# Patient Record
Sex: Female | Born: 1995
Health system: Southern US, Community
[De-identification: ages and names within clinical notes are randomized; demographics above are authoritative.]

## PROBLEM LIST (undated history)

## (undated) ENCOUNTER — Emergency Department (HOSPITAL_COMMUNITY): Admission: EM | Payer: Managed Care, Other (non HMO) | Source: Home / Self Care

## (undated) DIAGNOSIS — T8859XA Other complications of anesthesia, initial encounter: Secondary | ICD-10-CM

## (undated) DIAGNOSIS — I10 Essential (primary) hypertension: Secondary | ICD-10-CM

## (undated) HISTORY — DX: Other complications of anesthesia, initial encounter: T88.59XA

## (undated) HISTORY — PX: NO PAST SURGERIES: SHX2092

---
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
## 2017-04-04 ENCOUNTER — Encounter (HOSPITAL_COMMUNITY): Payer: Self-pay | Admitting: Emergency Medicine

## 2017-04-04 ENCOUNTER — Emergency Department (HOSPITAL_COMMUNITY)
Admission: EM | Admit: 2017-04-04 | Discharge: 2017-04-05 | Disposition: A | Discharge status: Home / Self Care | Payer: 59 | Attending: Emergency Medicine | Admitting: Emergency Medicine

## 2017-04-04 DIAGNOSIS — G4489 Other headache syndrome: Secondary | ICD-10-CM | POA: Diagnosis not present

## 2017-04-04 DIAGNOSIS — J029 Acute pharyngitis, unspecified: Secondary | ICD-10-CM

## 2017-04-04 DIAGNOSIS — R51 Headache: Secondary | ICD-10-CM | POA: Diagnosis present

## 2017-04-04 MED ORDER — ACETAMINOPHEN 325 MG PO TABS
650.0000 mg | ORAL_TABLET | Freq: Once | ORAL | Status: AC
  Administered 2017-04-05: 650 mg via ORAL
  Filled 2017-04-04: qty 2

## 2017-04-04 NOTE — ED Triage Notes (Signed)
Pt states that she has been having a headache that hurts on the left side of her face today.  Denies any nausea or vomiting, and photosensitivity

## 2017-04-04 NOTE — ED Notes (Signed)
Pt updated on delay, expressed understanding,  

## 2017-04-04 NOTE — ED Provider Notes (Signed)
[facility]-EMER[facility]Y DEPT Provider Note   CSN: [id number] Arrival date & time: 04/04/17  2108  By signing my name below, I, Michelle Thompson, attest that this documentation has been prepared under the direction and in the presence of [name], MD. Electronically Signed: Cynda Thompson, Scribe. 04/04/17. 11:39 PM.  Time seen 23:29 PM  History   Chief Complaint Chief Complaint  Patient presents with  . Headache    HPI Comments: Michelle Thompson is a 21 y.o. female with no pertinent medical history, who presents to the Emergency Department complaining of a gradual-onset, constant headache that began earlier today around 9 pm and improving about one hour ago. Patient states she feels as if her headache is a migraine, although she has never had a migraine in the past. Patient reports associated left-sided facial pain, left-sided anterior neck pain, sore throat, and photophobia. No modifying factors indicated. Patient describes her pain as aching with a severity of 9/10, now 5/10. Patient states she is allergic to motrin, but can take aleve. Patient was diagnosed with HTN after the birth of her child, she has been off of her blood pressure medication for about 6 months (her child is 75 yo now). Patient has a family history of migraines (sister). Patient denies any numbness or tingling in the extremities, dental pain, fever, nausea, vomiting, or any other symptoms. He states her headaches normally only last 15 minutes and this one has lasted longer than usual. She took no meds at home prior to coming to the ED  The history is provided by the patient. No language interpreter was used.   PCP [facility]  History reviewed. No pertinent past medical history.  There are no active problems to display for this patient.   History reviewed. No pertinent surgical history.  OB History    No data available       Home Medications    Prior to Admission medications   Medication Sig  Start Date End Date Taking? Authorizing Provider  UNKNOWN TO PATIENT Take 1 tablet by mouth daily.   Yes Historical Provider, MD    Family History History reviewed. No pertinent family history.  Social History Social History  Substance Use Topics  . Smoking status: Never Smoker  . Smokeless tobacco: Never Used  . Alcohol use No  employed   Allergies   Motrin [ibuprofen]   Review of Systems Review of Systems  Constitutional: Negative for fever.  HENT: Positive for sore throat. Negative for dental problem.        Left-sided facial pain.   Eyes: Positive for photophobia.  Gastrointestinal: Negative for nausea and vomiting.  Musculoskeletal: Positive for neck pain (left-sided).  Neurological: Positive for headaches. Negative for numbness.  All other systems reviewed and are negative.    Physical Exam Updated Vital Signs BP 134/87 (BP Location: Left Arm)   Pulse 99   Temp 98.5 F (36.9 C) (Oral)   Resp 18   Ht  (1.575 m)   Wt 122 lb (55.3 kg)   LMP 03/30/2017   SpO2 100%   BMI 22.31 kg/m   Vital signs normal    Physical Exam  Constitutional: She is oriented to person, place, and time. She appears well-developed and well-nourished.  Non-toxic appearance. She does not appear ill. No distress.  HENT:  Head: Normocephalic and atraumatic.  Right Ear: External ear normal.  Left Ear: External ear normal.  Nose: Nose normal. No mucosal edema or rhinorrhea.  Mouth/Throat: Oropharynx is  clear and moist and mucous membranes are normal. No dental abscesses or uvula swelling.  Non-tender to palpation of the sinuses.   Eyes: Conjunctivae and EOM are normal. Pupils are equal, round, and reactive to light.  Neck: Normal range of motion and full passive range of motion without pain. Neck supple.  Cardiovascular: Normal rate, regular rhythm and normal heart sounds.  Exam reveals no gallop and no friction rub.   No murmur heard. Pulmonary/Chest: Effort normal and breath  sounds normal. No respiratory distress. She has no wheezes. She has no rhonchi. She has no rales. She exhibits no tenderness and no crepitus.  Abdominal: Soft. Normal appearance and bowel sounds are normal. She exhibits no distension. There is no tenderness. There is no rebound and no guarding.  Musculoskeletal: Normal range of motion. She exhibits no edema or tenderness.  Moves all extremities well.   Lymphadenopathy:    She has no cervical adenopathy.  Neurological: She is alert and oriented to person, place, and time. She has normal strength. No cranial nerve deficit.  Skin: Skin is warm, dry and intact. No rash noted. No erythema. No pallor.  Psychiatric: She has a normal mood and affect. Her speech is normal and behavior is normal. Her mood appears not anxious.  Nursing note and vitals reviewed.    ED Treatments / Results  Labs (all labs ordered are listed, but only abnormal results are displayed) Results for orders placed or performed during the hospital encounter of 04/04/17  Rapid strep screen  Result Value Ref Range   Streptococcus, Group A Screen (Direct) NEGATIVE NEGATIVE     Procedures Procedures (including critical care time)  Medications Ordered in ED Medications  acetaminophen (TYLENOL) tablet 650 mg (650 mg Oral Given 04/05/17 0011)     Initial Impression / Assessment and Plan / ED Course  I have reviewed the triage vital signs and the nursing notes.  Pertinent labs & imaging results that were available during my care of the patient were reviewed by me and considered in my medical decision making (see chart for details).      DIAGNOSTIC STUDIES: Oxygen Saturation is 100% on RA, normal by my interpretation.    COORDINATION OF CARE: 11:39 PM Discussed treatment plan with pt at bedside and pt agreed to plan, which includes acetaminophen. Pt drove herself to the ED. I did not want to try toradol with her ibuprofen allergy despite being able to take naproxen.   Strept screen was done.  Pt reports her headache is much better, but not gone. Given her test results.  She can take aleve at home for her headache.    Final Clinical Impressions(s) / ED Diagnoses   Final diagnoses:  Other headache syndrome  Sore throat    New Prescriptions OTC acetaminophen/aleve   Plan discharge  I personally performed the services described in this documentation, which was scribed in my presence. The recorded information has been reviewed and considered.  [name], MD, Concha Pyo, MD 04/05/17 [phone]

## 2017-04-05 LAB — RAPID STREP SCREEN (MED CTR MEBANE ONLY): Streptococcus, Group A Screen (Direct): NEGATIVE

## 2017-04-05 NOTE — Discharge Instructions (Signed)
Drink plenty of fluids. Take acetaminophen 650 mg every 6 hrs + 2 Aleve 2 tabs twice a day for headache or sore throat as needed. Gargle with salt water for discomfort. Use sore throat lozenges as needed. Recheck if you get a fever or seem worse.

## 2017-04-07 LAB — CULTURE, GROUP A STREP (THRC)

## 2017-05-03 ENCOUNTER — Emergency Department (HOSPITAL_COMMUNITY): Payer: 59

## 2017-05-03 ENCOUNTER — Encounter (HOSPITAL_COMMUNITY): Payer: Self-pay | Admitting: Emergency Medicine

## 2017-05-03 DIAGNOSIS — R0789 Other chest pain: Secondary | ICD-10-CM | POA: Insufficient documentation

## 2017-05-03 DIAGNOSIS — I10 Essential (primary) hypertension: Secondary | ICD-10-CM | POA: Insufficient documentation

## 2017-05-03 NOTE — ED Triage Notes (Signed)
Pt c/o central chest tightness while at work with sob.

## 2017-05-04 ENCOUNTER — Emergency Department (HOSPITAL_COMMUNITY)
Admission: EM | Admit: 2017-05-04 | Discharge: 2017-05-04 | Disposition: A | Discharge status: Home / Self Care | Payer: 59 | Attending: Emergency Medicine | Admitting: Emergency Medicine

## 2017-05-04 DIAGNOSIS — R0789 Other chest pain: Secondary | ICD-10-CM

## 2017-05-04 HISTORY — DX: Essential (primary) hypertension: I10

## 2017-05-04 NOTE — ED Provider Notes (Signed)
[facility] Provider Note   CSN: [id number] Arrival date & time: 05/03/17  2243     History   Chief Complaint Chief Complaint  Patient presents with  . Chest Pain    HPI Michelle Thompson is a 21 y.o. female.  Patient presents with complaints of right-sided chest pain that began while at work. She was doing light lifting and work when the pain began. She reports that there was also coughing episode that occurred right before the pain started. She has had these symptoms before. She [place]. No reason for the pain has been discovered. Patient reports that the pain is improved at time of arrival to the ER.      Past Medical History:  Diagnosis Date  . Hypertension     There are no active problems to display for this patient.   History reviewed. No pertinent surgical history.  OB History    No data available       Home Medications    Prior to Admission medications   Medication Sig Start Date End Date Taking? Authorizing Provider  UNKNOWN TO PATIENT Take 1 tablet by mouth daily.    [provider]    Family History History reviewed. No pertinent family history.  Social History Social History  Substance Use Topics  . Smoking status: Never Smoker  . Smokeless tobacco: Never Used  . Alcohol use No     Allergies   Motrin [ibuprofen]   Review of Systems Review of Systems  Respiratory: Positive for cough.   Cardiovascular: Positive for chest pain.  All other systems reviewed and are negative.    Physical Exam Updated Vital Signs Pulse 83   Temp 98 F (36.7 C) (Oral)   Resp 20   Ht 5\' 2"  (1.575 m)   Wt 117 lb (53.1 kg)   LMP 04/23/2017   SpO2 100%   BMI 21.40 kg/m   Physical Exam  Constitutional: She is oriented to person, place, and time. She appears well-developed and well-nourished. No distress.  HENT:  Head: Normocephalic and atraumatic.  Right Ear: Hearing normal.  Left Ear: Hearing  normal.  Nose: Nose normal.  Mouth/Throat: Oropharynx is clear and moist and mucous membranes are normal.  Eyes: Conjunctivae and EOM are normal. Pupils are equal, round, and reactive to light.  Neck: Normal range of motion. Neck supple.  Cardiovascular: Regular rhythm, S1 normal and S2 normal.  Exam reveals no gallop and no friction rub.   No murmur heard. Pulmonary/Chest: Effort normal and breath sounds normal. No respiratory distress. She exhibits tenderness.    Abdominal: Soft. Normal appearance and bowel sounds are normal. There is no hepatosplenomegaly. There is no tenderness. There is no rebound, no guarding, no tenderness at McBurney's point and negative Murphy's sign. No hernia.  Musculoskeletal: Normal range of motion.  Neurological: She is alert and oriented to person, place, and time. She has normal strength. No cranial nerve deficit or sensory deficit. Coordination normal. GCS eye subscore is 4. GCS verbal subscore is 5. GCS motor subscore is 6.  Skin: Skin is warm, dry and intact. No rash noted. No cyanosis.  Psychiatric: She has a normal mood and affect. Her speech is normal and behavior is normal. Thought content normal.  Nursing note and vitals reviewed.    ED Treatments / Results  Labs (all labs ordered are listed, but only abnormal results are displayed) Labs Reviewed - No data to display  EKG  EKG Interpretation  Date/Time:  Friday May 03 2017 22:57:24 EDT Ventricular Rate:  71 PR Interval:  140 QRS Duration: 84 QT Interval:  376 QTC Calculation: 408 R Axis:   88 Text Interpretation:  Normal sinus rhythm with sinus arrhythmia T wave abnormality, consider anterior ischemia Abnormal ECG No significant change since last tracing Confirmed by Addis Tuohy  MD, Ilee Randleman [phone]) on 05/04/2017 1:05:14 AM       Radiology Dg Chest 2 View  Result Date: 05/03/2017 CLINICAL DATA:  Central chest tightness EXAM: CHEST  2 VIEW COMPARISON:  None. FINDINGS: The heart size and  mediastinal contours are within normal limits. Both lungs are clear. The visualized skeletal structures are unremarkable. IMPRESSION: No active cardiopulmonary disease. Electronically Signed   By: Jasmine Pang M.D.   On: 05/03/2017 23:10    Procedures Procedures (including critical care time)  Medications Ordered in ED Medications - No data to display   Initial Impression / Assessment and Plan / ED Course  I have reviewed the triage vital signs and the nursing notes.  Pertinent labs & imaging results that were available during my care of the patient were reviewed by me and considered in my medical decision making (see chart for details).     Patient presents with complaints of chest pain. Pain is on the right side of her chest wall area and is reproducible with palpation here in the ER. She has had workups previously. EKG reveals inverted T waves anteriorly, but she has several other EKGs from previous visits with similar pattern. No change today. Chest x-ray is clear. Patient exhibiting signs of musculoskeletal chest wall pain, reassured, will treat.  Final Clinical Impressions(s) / ED Diagnoses   Final diagnoses:  Chest wall pain    New Prescriptions New Prescriptions   No medications on file     Gilda Crease, MD 05/04/17 [phone]

## 2017-06-14 ENCOUNTER — Encounter (HOSPITAL_COMMUNITY): Payer: Self-pay

## 2017-06-14 ENCOUNTER — Emergency Department (HOSPITAL_COMMUNITY)
Admission: EM | Admit: 2017-06-14 | Discharge: 2017-06-14 | Disposition: A | Discharge status: Home / Self Care | Payer: 59 | Attending: Emergency Medicine | Admitting: Emergency Medicine

## 2017-06-14 DIAGNOSIS — I10 Essential (primary) hypertension: Secondary | ICD-10-CM | POA: Diagnosis not present

## 2017-06-14 DIAGNOSIS — Y999 Unspecified external cause status: Secondary | ICD-10-CM | POA: Diagnosis not present

## 2017-06-14 DIAGNOSIS — W57XXXA Bitten or stung by nonvenomous insect and other nonvenomous arthropods, initial encounter: Secondary | ICD-10-CM | POA: Insufficient documentation

## 2017-06-14 DIAGNOSIS — Y929 Unspecified place or not applicable: Secondary | ICD-10-CM | POA: Insufficient documentation

## 2017-06-14 DIAGNOSIS — S70361A Insect bite (nonvenomous), right thigh, initial encounter: Secondary | ICD-10-CM | POA: Diagnosis not present

## 2017-06-14 DIAGNOSIS — Y9389 Activity, other specified: Secondary | ICD-10-CM | POA: Insufficient documentation

## 2017-06-14 DIAGNOSIS — R110 Nausea: Secondary | ICD-10-CM | POA: Diagnosis not present

## 2017-06-14 MED ORDER — IBUPROFEN 400 MG PO TABS
400.0000 mg | ORAL_TABLET | Freq: Once | ORAL | Status: AC
  Administered 2017-06-14: 400 mg via ORAL
  Filled 2017-06-14: qty 1

## 2017-06-14 MED ORDER — DOXYCYCLINE HYCLATE 100 MG PO TABS
100.0000 mg | ORAL_TABLET | Freq: Once | ORAL | Status: AC
  Administered 2017-06-14: 100 mg via ORAL
  Filled 2017-06-14: qty 1

## 2017-06-14 MED ORDER — DOXYCYCLINE HYCLATE 100 MG PO CAPS: 100.0000 mg | ORAL_CAPSULE | Freq: Two times a day (BID) | ORAL | 0 refills | Status: DC

## 2017-06-14 NOTE — Discharge Instructions (Signed)
Take the antibiotics as prescribed. Use ice and ibuprofen as needed for pain and swelling. Followup with a primary doctor. Return to the ED if the swelling and redness worsen, you develop a fever, vomiting, or any other concerns.

## 2017-06-14 NOTE — ED Provider Notes (Signed)
[facility]-EM[facility] DEPT Provider Note   CSN: [id number] Arrival date & time: 06/14/17  0447     History   Chief Complaint Chief Complaint  Patient presents with  . Insect Bite    HPI Michelle Thompson is a 21 y.o. female.  Patient reports tick bite to right upper thigh that she noticed yesterday. States she pulled off the tick 24 hours ago. At that time she had a small area of redness which is gotten bigger overnight. She describes pain, redness and swelling to her right anterior thigh that is progressively worsening. she took ibuprofen about 6 hours ago. Denies fever. Has had nausea but no vomiting. No difficulty breathing or swallowing. No previous similar reactions to tick bites. Denies any possibility of pregnancy.   The history is provided by the patient.    Past Medical History:  Diagnosis Date  . Hypertension     There are no active problems to display for this patient.   History reviewed. No pertinent surgical history.  OB History    No data available       Home Medications    Prior to Admission medications   Medication Sig Start Date End Date Taking? Authorizing Provider  UNKNOWN TO PATIENT Take 1 tablet by mouth daily.   Yes [provider]    Family History No family history on file.  Social History Social History  Substance Use Topics  . Smoking status: Never Smoker  . Smokeless tobacco: Never Used  . Alcohol use No     Allergies   Motrin [ibuprofen]   Review of Systems Review of Systems  Constitutional: Negative for activity change, appetite change and fever.  HENT: Negative for congestion and trouble swallowing.   Respiratory: Negative for cough, chest tightness and shortness of breath.   Cardiovascular: Negative for chest pain and leg swelling.  Gastrointestinal: Negative for abdominal pain, nausea and vomiting.  Genitourinary: Negative for dysuria, vaginal bleeding and vaginal discharge.  Musculoskeletal: Negative for  arthralgias and myalgias.  Skin: Positive for rash. Negative for wound.  Neurological: Negative for dizziness, weakness and headaches.   all other systems are negative except as noted in the HPI and PMH.     Physical Exam Updated Vital Signs BP 137/86 (BP Location: Left Arm)   Pulse 90   Temp 98 F (36.7 C) (Oral)   Resp 16   Ht 5\' 2"  (1.575 m)   Wt 53.1 kg (117 lb)   LMP 05/11/2017   SpO2 100%   BMI 21.40 kg/m   Physical Exam  Constitutional: She is oriented to person, place, and time. She appears well-developed and well-nourished. No distress.  HENT:  Head: Normocephalic and atraumatic.  Mouth/Throat: Oropharynx is clear and moist. No oropharyngeal exudate.  Eyes: Conjunctivae and EOM are normal. Pupils are equal, round, and reactive to light.  Neck: Normal range of motion. Neck supple.  No meningismus.  Cardiovascular: Normal rate, regular rhythm, normal heart sounds and intact distal pulses.   No murmur heard. Pulmonary/Chest: Effort normal and breath sounds normal. No respiratory distress.  Abdominal: Soft. There is no tenderness. There is no rebound and no guarding.  Musculoskeletal: Normal range of motion. She exhibits tenderness. She exhibits no edema.  Neurological: She is alert and oriented to person, place, and time. No cranial nerve deficit. She exhibits normal muscle tone. Coordination normal.   5/5 strength throughout. CN 2-12 intact.Equal grip strength.   Skin: Skin is warm. Rash noted. There is erythema.  Chaperone present. 4 x8  cm area of erythema to R anterior thigh.  Central induration present. No fluctuance.  Psychiatric: She has a normal mood and affect. Her behavior is normal.  Nursing note and vitals reviewed.    ED Treatments / Results  Labs (all labs ordered are listed, but only abnormal results are displayed) Labs Reviewed - No data to display  EKG  EKG Interpretation None       Radiology No results found.  Procedures Procedures  (including critical care time)  Medications Ordered in ED Medications  ibuprofen (ADVIL,MOTRIN) tablet 400 mg (not administered)  doxycycline (VIBRA-TABS) tablet 100 mg (not administered)     Initial Impression / Assessment and Plan / ED Course  I have reviewed the triage vital signs and the nursing notes.  Pertinent labs & imaging results that were available during my care of the patient were reviewed by me and considered in my medical decision making (see chart for details).    Tick bite to R anterior thigh.  Erythema without fluctuance. Well appearing, nontoxic.   Treat with doxycycline which should cover any component of early cellulitis, NSAIDs, ice. PCP follow up and recheck in 2 days. Return precautions discussed.   Final Clinical Impressions(s) / ED Diagnoses   Final diagnoses:  Tick bite, initial encounter    New Prescriptions New Prescriptions   No medications on file     Glynn Octave, MD 06/14/17 [id number]

## 2017-06-14 NOTE — ED Triage Notes (Signed)
Pt states she pulled a tick off of her right upper leg yesterday and now has a large red swelling around the bite area that is painful

## 2017-06-29 ENCOUNTER — Emergency Department (HOSPITAL_COMMUNITY): Payer: 59

## 2017-06-29 ENCOUNTER — Emergency Department (HOSPITAL_COMMUNITY)
Admission: EM | Admit: 2017-06-29 | Discharge: 2017-06-29 | Disposition: A | Discharge status: Home / Self Care | Payer: 59 | Attending: Emergency Medicine | Admitting: Emergency Medicine

## 2017-06-29 ENCOUNTER — Encounter (HOSPITAL_COMMUNITY): Payer: Self-pay | Admitting: Emergency Medicine

## 2017-06-29 DIAGNOSIS — E860 Dehydration: Secondary | ICD-10-CM

## 2017-06-29 DIAGNOSIS — K529 Noninfective gastroenteritis and colitis, unspecified: Secondary | ICD-10-CM | POA: Diagnosis not present

## 2017-06-29 DIAGNOSIS — Z79899 Other long term (current) drug therapy: Secondary | ICD-10-CM | POA: Diagnosis not present

## 2017-06-29 DIAGNOSIS — R112 Nausea with vomiting, unspecified: Secondary | ICD-10-CM | POA: Diagnosis present

## 2017-06-29 DIAGNOSIS — I10 Essential (primary) hypertension: Secondary | ICD-10-CM | POA: Diagnosis not present

## 2017-06-29 LAB — CBC WITH DIFFERENTIAL/PLATELET
Basophils Absolute: 0 10*3/uL (ref 0.0–0.1)
Basophils Relative: 0 %
EOS ABS: 0 10*3/uL (ref 0.0–0.7)
EOS PCT: 0 %
HCT: 46.1 % — ABNORMAL HIGH (ref 36.0–46.0)
Hemoglobin: 15.2 g/dL — ABNORMAL HIGH (ref 12.0–15.0)
Lymphocytes Relative: 3 %
Lymphs Abs: 0.5 10*3/uL — ABNORMAL LOW (ref 0.7–4.0)
MCH: 23 pg — AB (ref 26.0–34.0)
MCHC: 33 g/dL (ref 30.0–36.0)
MCV: 69.6 fL — ABNORMAL LOW (ref 78.0–100.0)
Monocytes Absolute: 0.5 10*3/uL (ref 0.1–1.0)
Monocytes Relative: 3 %
Neutro Abs: 14.1 10*3/uL — ABNORMAL HIGH (ref 1.7–7.7)
Neutrophils Relative %: 94 %
PLATELETS: 223 10*3/uL (ref 150–400)
RBC: 6.62 MIL/uL — AB (ref 3.87–5.11)
RDW: 13.5 % (ref 11.5–15.5)
WBC: 15.1 10*3/uL — AB (ref 4.0–10.5)

## 2017-06-29 LAB — URINALYSIS, ROUTINE W REFLEX MICROSCOPIC
BILIRUBIN URINE: NEGATIVE
Glucose, UA: NEGATIVE mg/dL
Ketones, ur: 5 mg/dL — AB
Leukocytes, UA: NEGATIVE
NITRITE: NEGATIVE
Protein, ur: 100 mg/dL — AB
Specific Gravity, Urine: 1.027 (ref 1.005–1.030)
pH: 5 (ref 5.0–8.0)

## 2017-06-29 LAB — COMPREHENSIVE METABOLIC PANEL
ALT: 31 U/L (ref 14–54)
ANION GAP: 13 (ref 5–15)
AST: 35 U/L (ref 15–41)
Albumin: 4.7 g/dL (ref 3.5–5.0)
Alkaline Phosphatase: 96 U/L (ref 38–126)
BUN: 12 mg/dL (ref 6–20)
CHLORIDE: 106 mmol/L (ref 101–111)
CO2: 21 mmol/L — AB (ref 22–32)
Calcium: 10.3 mg/dL (ref 8.9–10.3)
Creatinine, Ser: 0.83 mg/dL (ref 0.44–1.00)
GFR calc non Af Amer: >60 mL/min (ref >60)
Glucose, Bld: 117 mg/dL — ABNORMAL HIGH (ref 65–99)
POTASSIUM: 4.2 mmol/L (ref 3.5–5.1)
Sodium: 140 mmol/L (ref 135–145)
Total Bilirubin: 1 mg/dL (ref 0.3–1.2)
Total Protein: 9 g/dL — ABNORMAL HIGH (ref 6.5–8.1)

## 2017-06-29 LAB — I-STAT BETA HCG BLOOD, ED (MC, WL, AP ONLY): I-stat hCG, quantitative: 5.1 m[IU]/mL — ABNORMAL HIGH (ref <5)

## 2017-06-29 LAB — HCG, QUANTITATIVE, PREGNANCY

## 2017-06-29 MED ORDER — HYDROMORPHONE HCL 1 MG/ML IJ SOLN
0.5000 mg | Freq: Once | INTRAMUSCULAR | Status: AC
  Administered 2017-06-29: 0.5 mg via INTRAVENOUS
  Filled 2017-06-29: qty 1

## 2017-06-29 MED ORDER — ONDANSETRON HCL 4 MG/2ML IJ SOLN
4.0000 mg | Freq: Once | INTRAMUSCULAR | Status: AC
  Administered 2017-06-29: 4 mg via INTRAVENOUS
  Filled 2017-06-29: qty 2

## 2017-06-29 MED ORDER — SODIUM CHLORIDE 0.9 % IV BOLUS (SEPSIS)
1000.0000 mL | Freq: Once | INTRAVENOUS | Status: AC
  Administered 2017-06-29: 1000 mL via INTRAVENOUS

## 2017-06-29 MED ORDER — ONDANSETRON 4 MG PO TBDP: ORAL_TABLET | ORAL | 0 refills | Status: DC

## 2017-06-29 MED ORDER — TRAMADOL HCL 50 MG PO TABS: 50.0000 mg | ORAL_TABLET | Freq: Four times a day (QID) | ORAL | 0 refills | Status: DC | PRN

## 2017-06-29 MED ORDER — IOPAMIDOL (ISOVUE-300) INJECTION 61%
INTRAVENOUS | Status: AC
  Administered 2017-06-29: 19:00:00
  Filled 2017-06-29: qty 30

## 2017-06-29 MED ORDER — SODIUM CHLORIDE 0.9 % IV BOLUS (SEPSIS)
2000.0000 mL | Freq: Once | INTRAVENOUS | Status: AC
  Administered 2017-06-29: 2000 mL via INTRAVENOUS

## 2017-06-29 MED ORDER — IOPAMIDOL (ISOVUE-300) INJECTION 61%
100.0000 mL | Freq: Once | INTRAVENOUS | Status: AC | PRN
  Administered 2017-06-29: 100 mL via INTRAVENOUS

## 2017-06-29 MED ORDER — PANTOPRAZOLE SODIUM 40 MG IV SOLR
40.0000 mg | Freq: Once | INTRAVENOUS | Status: AC
  Administered 2017-06-29: 40 mg via INTRAVENOUS
  Filled 2017-06-29: qty 40

## 2017-06-29 NOTE — ED Notes (Signed)
Dr Deretha EmoryZackowski notified of pt's heartrate, advised pt was ok for discharge, no additional orders given,

## 2017-06-29 NOTE — ED Triage Notes (Signed)
Pt reports n/v/d since 0430 this morning with lower back pain.  Has not been able to keep anything down.

## 2017-06-29 NOTE — ED Provider Notes (Signed)
[facility] Provider Note   CSN: [id number] Arrival date & time: 06/29/17  1547     History   Chief Complaint Chief Complaint  Patient presents with  . Emesis  . Diarrhea    HPI Michelle Thompson is a 21 y.o. female.  Patient complains of vomiting and diarrhea with some back pain.   The history is provided by the patient. No language interpreter was used.  Emesis   This is a recurrent problem. The current episode started yesterday. The problem occurs more than 10 times per day. The problem has not changed since onset.The emesis has an appearance of stomach contents. There has been no fever. Associated symptoms include diarrhea. Pertinent negatives include no abdominal pain, no cough and no headaches.  Diarrhea   Associated symptoms include vomiting. Pertinent negatives include no abdominal pain, no headaches and no cough.    Past Medical History:  Diagnosis Date  . Hypertension     There are no active problems to display for this patient.   History reviewed. No pertinent surgical history.  OB History    No data available       Home Medications    Prior to Admission medications   Medication Sig Start Date End Date Taking? Authorizing Provider  etonogestrel (NEXPLANON) 68 MG IMPL implant 1 each by Subdermal route once.   Yes [provider]    Family History History reviewed. No pertinent family history.  Social History Social History  Substance Use Topics  . Smoking status: Never Smoker  . Smokeless tobacco: Never Used  . Alcohol use No     Allergies   Motrin [ibuprofen]   Review of Systems Review of Systems  Constitutional: Negative for appetite change and fatigue.  HENT: Negative for congestion, ear discharge and sinus pressure.   Eyes: Negative for discharge.  Respiratory: Negative for cough.   Cardiovascular: Negative for chest pain.  Gastrointestinal: Positive for diarrhea and vomiting. Negative for abdominal pain.    Genitourinary: Negative for frequency and hematuria.  Musculoskeletal: Negative for back pain.  Skin: Negative for rash.  Neurological: Negative for seizures and headaches.  Psychiatric/Behavioral: Negative for hallucinations.     Physical Exam Updated Vital Signs BP 120/67   Pulse (!) 120   Temp 98.5 F (36.9 C) (Oral)   Resp 15   Ht 5\' 2"  (1.575 m)   Wt 51.7 kg (114 lb)   LMP 06/19/2017   SpO2 99%   BMI 20.85 kg/m   Physical Exam  Constitutional: She is oriented to person, place, and time. She appears well-developed.  HENT:  Head: Normocephalic.  Eyes: Conjunctivae and EOM are normal. No scleral icterus.  Neck: Neck supple. No thyromegaly present.  Cardiovascular: Normal rate and regular rhythm.  Exam reveals no gallop and no friction rub.   No murmur heard. Pulmonary/Chest: No stridor. She has no wheezes. She has no rales. She exhibits no tenderness.  Abdominal: She exhibits no distension. There is tenderness. There is no rebound.  Minimal left lower quadrant light low quadrant tenderness  Musculoskeletal: Normal range of motion. She exhibits no edema.  Lymphadenopathy:    She has no cervical adenopathy.  Neurological: She is oriented to person, place, and time. She exhibits normal muscle tone. Coordination normal.  Skin: No rash noted. No erythema.  Psychiatric: She has a normal mood and affect. Her behavior is normal.     ED Treatments / Results  Labs (all labs ordered are listed, but only abnormal results are displayed) Labs  Reviewed  CBC WITH DIFFERENTIAL/PLATELET - Abnormal; Notable for the following:       Result Value   WBC 15.1 (*)    RBC 6.62 (*)    Hemoglobin 15.2 (*)    HCT 46.1 (*)    MCV 69.6 (*)    MCH 23.0 (*)    Neutro Abs 14.1 (*)    Lymphs Abs 0.5 (*)    All other components within normal limits  COMPREHENSIVE METABOLIC PANEL - Abnormal; Notable for the following:    CO2 21 (*)    Glucose, Bld 117 (*)    Total Protein 9.0 (*)    All  other components within normal limits  URINALYSIS, ROUTINE W REFLEX MICROSCOPIC - Abnormal; Notable for the following:    APPearance HAZY (*)    Hgb urine dipstick SMALL (*)    Ketones, ur 5 (*)    Protein, ur 100 (*)    Bacteria, UA RARE (*)    Squamous Epithelial / LPF 6-30 (*)    All other components within normal limits  I-STAT BETA HCG BLOOD, ED ([facility], [facility], [facility] ONLY) - Abnormal; Notable for the following:    I-stat hCG, quantitative 5.1 (*)    All other components within normal limits  HCG, QUANTITATIVE, PREGNANCY    EKG  EKG Interpretation None       Radiology No results found.  Procedures Procedures (including critical care time)  Medications Ordered in ED Medications  iopamidol (ISOVUE-300) 61 % injection (not administered)  iopamidol (ISOVUE-300) 61 % injection 100 mL (not administered)  ondansetron (ZOFRAN) injection 4 mg (4 mg Intravenous Given 06/29/17 1615)  pantoprazole (PROTONIX) injection 40 mg (40 mg Intravenous Given 06/29/17 1615)  sodium chloride 0.9 % bolus 2,000 mL (0 mLs Intravenous Stopped 06/29/17 1840)  sodium chloride 0.9 % bolus 1,000 mL (0 mLs Intravenous Stopped 06/29/17 2108)  HYDROmorphone (DILAUDID) injection 0.5 mg (0.5 mg Intravenous Given 06/29/17 1839)     Initial Impression / Assessment and Plan / ED Course  I have reviewed the triage vital signs and the nursing notes.  Pertinent labs & imaging results that were available during my care of the patient were reviewed by me and considered in my medical decision making (see chart for details).   patient with gastroenteritis. She will be sent home with Zofran and Ultram   Final Clinical Impressions(s) / ED Diagnoses   Final diagnoses:  Dehydration    New Prescriptions New Prescriptions   No medications on file     Bethann BerkshireZammit, Sae Handrich, MD 06/29/17 2201

## 2017-06-29 NOTE — ED Notes (Signed)
Pt drinking contrast without difficulty.  Denies any pain at this time. Denies any nausea.

## 2017-06-29 NOTE — Discharge Instructions (Signed)
Drink plenty of fluids.  Follow-up with your family doctor next week for recheck 

## 2017-07-01 LAB — URINE CULTURE: Culture: NO GROWTH

## 2017-08-12 ENCOUNTER — Encounter (HOSPITAL_COMMUNITY): Payer: Self-pay

## 2017-08-12 DIAGNOSIS — Z0471 Encounter for examination and observation following alleged adult physical abuse: Secondary | ICD-10-CM | POA: Insufficient documentation

## 2017-08-12 DIAGNOSIS — Z5321 Procedure and treatment not carried out due to patient leaving prior to being seen by health care provider: Secondary | ICD-10-CM | POA: Insufficient documentation

## 2017-08-12 DIAGNOSIS — R221 Localized swelling, mass and lump, neck: Secondary | ICD-10-CM | POA: Diagnosis present
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
## 2017-08-13 ENCOUNTER — Emergency Department (HOSPITAL_COMMUNITY)
Admission: EM | Admit: 2017-08-13 | Discharge: 2017-08-13 | Disposition: A | Discharge status: Home / Self Care | Payer: 59 | Attending: Emergency Medicine | Admitting: Emergency Medicine

## 2017-08-13 NOTE — ED Notes (Signed)
Registration states pt left 

## 2017-12-06 ENCOUNTER — Emergency Department
Admission: EM | Admit: 2017-12-06 | Discharge: 2017-12-06 | Disposition: A | Discharge status: Home / Self Care | Payer: 59 | Attending: Emergency Medicine | Admitting: Emergency Medicine

## 2017-12-06 ENCOUNTER — Emergency Department: Payer: 59

## 2017-12-06 ENCOUNTER — Encounter: Payer: Self-pay | Admitting: Emergency Medicine

## 2017-12-06 DIAGNOSIS — Y990 Civilian activity done for income or pay: Secondary | ICD-10-CM | POA: Diagnosis not present

## 2017-12-06 DIAGNOSIS — Y929 Unspecified place or not applicable: Secondary | ICD-10-CM | POA: Insufficient documentation

## 2017-12-06 DIAGNOSIS — Y939 Activity, unspecified: Secondary | ICD-10-CM | POA: Insufficient documentation

## 2017-12-06 DIAGNOSIS — S6391XA Sprain of unspecified part of right wrist and hand, initial encounter: Secondary | ICD-10-CM | POA: Diagnosis not present

## 2017-12-06 DIAGNOSIS — S63501A Unspecified sprain of right wrist, initial encounter: Secondary | ICD-10-CM

## 2017-12-06 DIAGNOSIS — S6991XA Unspecified injury of right wrist, hand and finger(s), initial encounter: Secondary | ICD-10-CM | POA: Diagnosis present

## 2017-12-06 MED ORDER — TRAMADOL HCL 50 MG PO TABS: 50.0000 mg | ORAL_TABLET | Freq: Four times a day (QID) | ORAL | 0 refills | Status: DC | PRN
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
## 2017-12-06 NOTE — Discharge Instructions (Signed)
Follow up with the primary care provider of your choice for symptoms that are not improving over the week °Return to the ER for symptoms that change or worsen if unable to schedule an appointment. °

## 2017-12-06 NOTE — ED Triage Notes (Signed)
Patient presents to the ED with right wrist pain x 2 weeks.  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  Patient states, "I know it's not broken because I can move it a lot of ways, it's just certain ways I can't move it.  It's hard to drive and sometimes it's numb and tingly."  Patient is in no obvious distress at this time.

## 2017-12-06 NOTE — ED Notes (Signed)
See triage note  Developed right wrist pain about 2 weeks ago  Denies specific injury  Tenderness with movement and touch  Positive pulses

## 2018-01-15 ENCOUNTER — Emergency Department
Admission: EM | Admit: 2018-01-15 | Discharge: 2018-01-15 | Disposition: A | Discharge status: Home / Self Care | Payer: Self-pay | Attending: Emergency Medicine | Admitting: Emergency Medicine

## 2018-01-15 ENCOUNTER — Emergency Department: Payer: Self-pay

## 2018-01-15 ENCOUNTER — Encounter: Payer: Self-pay | Admitting: Emergency Medicine

## 2018-01-15 ENCOUNTER — Other Ambulatory Visit: Payer: Self-pay

## 2018-01-15 DIAGNOSIS — J039 Acute tonsillitis, unspecified: Secondary | ICD-10-CM | POA: Insufficient documentation

## 2018-01-15 DIAGNOSIS — Z79899 Other long term (current) drug therapy: Secondary | ICD-10-CM | POA: Insufficient documentation

## 2018-01-15 DIAGNOSIS — I10 Essential (primary) hypertension: Secondary | ICD-10-CM | POA: Insufficient documentation

## 2018-01-15 LAB — URINALYSIS, COMPLETE (UACMP) WITH MICROSCOPIC
Bilirubin Urine: NEGATIVE
Glucose, UA: NEGATIVE mg/dL
HGB URINE DIPSTICK: NEGATIVE
Ketones, ur: NEGATIVE mg/dL
Leukocytes, UA: NEGATIVE
NITRITE: NEGATIVE
Protein, ur: 30 mg/dL — AB
SPECIFIC GRAVITY, URINE: 1.024 (ref 1.005–1.030)
pH: 7 (ref 5.0–8.0)

## 2018-01-15 LAB — INFLUENZA PANEL BY PCR (TYPE A & B)
INFLAPCR: NEGATIVE
Influenza B By PCR: NEGATIVE

## 2018-01-15 LAB — GROUP A STREP BY PCR: GROUP A STREP BY PCR: NOT DETECTED

## 2018-01-15 MED ORDER — AMOXICILLIN-POT CLAVULANATE 875-125 MG PO TABS
1.0000 | ORAL_TABLET | Freq: Once | ORAL | Status: AC
  Administered 2018-01-15: 1 via ORAL
  Filled 2018-01-15: qty 1

## 2018-01-15 MED ORDER — LIDOCAINE VISCOUS 2 % MT SOLN: 10.0000 mL | OROMUCOSAL | 0 refills | Status: DC | PRN

## 2018-01-15 MED ORDER — AMOXICILLIN-POT CLAVULANATE 875-125 MG PO TABS: 1.0000 | ORAL_TABLET | Freq: Two times a day (BID) | ORAL | 0 refills | Status: AC

## 2018-01-15 MED ORDER — ACETAMINOPHEN 325 MG PO TABS
650.0000 mg | ORAL_TABLET | Freq: Once | ORAL | Status: AC | PRN
  Administered 2018-01-15: 650 mg via ORAL
  Filled 2018-01-15 (×2): qty 2

## 2018-01-15 MED ORDER — IPRATROPIUM-ALBUTEROL 0.5-2.5 (3) MG/3ML IN SOLN
3.0000 mL | Freq: Once | RESPIRATORY_TRACT | Status: AC
  Administered 2018-01-15: 3 mL via RESPIRATORY_TRACT
  Filled 2018-01-15: qty 3

## 2018-01-15 MED ORDER — SODIUM CHLORIDE 0.9 % IV BOLUS (SEPSIS)
1000.0000 mL | Freq: Once | INTRAVENOUS | Status: AC
  Administered 2018-01-15: 1000 mL via INTRAVENOUS

## 2018-01-15 NOTE — ED Notes (Signed)
Pt states she was given 2 tylenol tablets by triage nurse upon arrival.

## 2018-01-15 NOTE — ED Provider Notes (Signed)
[facility] Emergency Department Provider Note  ____________________________________________  Time seen: Approximately 2:16 PM  I have reviewed the triage vital signs and the nursing notes.   HISTORY  Chief Complaint Fever    HPI Michelle Thompson is a 22 y.o. female that presents to emergency department for evaluation of body aches, fever, chills, sore throat for 3 days.  Patient states that  she did not have a thermometer so her mother felt her forehead and states that she did not feel warm.  Today when she was at work there was a doctor who took her temperature and read it at 102.  She feels like she has to cough something up but is unable to get anything up.  She has been taking robitussin for 3 days without relief. She denies nasal congestion, shortness of breath, chest pain, nausea, vomiting, abdominal pain, dysuria, urgency, frequency.  Past Medical History:  Diagnosis Date  . Hypertension     There are no active problems to display for this patient.   History reviewed. No pertinent surgical history.  Prior to Admission medications   Medication Sig Start Date End Date Taking? Authorizing Provider  amoxicillin-clavulanate (AUGMENTIN) 875-125 MG tablet Take 1 tablet by mouth 2 (two) times daily for 10 days. 01/15/18 01/25/18  Enid Derry, PA-C  etonogestrel (NEXPLANON) 68 MG IMPL implant 1 each by Subdermal route once.    [provider]  lidocaine (XYLOCAINE) 2 % solution Use as directed 10 mLs in the mouth or throat as needed for mouth pain. 01/15/18   Enid Derry, PA-C  ondansetron (ZOFRAN ODT) 4 MG disintegrating tablet 4mg  ODT q4 hours prn nausea/vomit 06/29/17   Bethann Berkshire, MD  traMADol (ULTRAM) 50 MG tablet Take 1 tablet (50 mg total) by mouth every 6 (six) hours as needed. 12/06/17   Triplett, Kasandra Knudsen, FNP    Allergies Motrin [ibuprofen]  History reviewed. No pertinent family history.  Social History Social History   Tobacco  Use  . Smoking status: Never Smoker  . Smokeless tobacco: Never Used  Substance Use Topics  . Alcohol use: No  . Drug use: No     Review of Systems  ENT: Negative for congestion and rhinorrhea. Cardiovascular: No chest pain. Respiratory: No SOB. Gastrointestinal: No abdominal pain.  No nausea, no vomiting.  No diarrhea.  No constipation. Musculoskeletal: Negative for musculoskeletal pain. Skin: Negative for rash, abrasions, lacerations, ecchymosis.   ____________________________________________   PHYSICAL EXAM:  VITAL SIGNS: ED Triage Vitals  Enc Vitals Group     BP 01/15/18 1252 137/70     Pulse Rate 01/15/18 1252 (!) 120     Resp 01/15/18 1252 18     Temp 01/15/18 1252 (!) 101.9 F (38.8 C)     Temp Source 01/15/18 1252 Oral     SpO2 01/15/18 1252 100 %     Weight 01/15/18 1253 135 lb (61.2 kg)     Height 01/15/18 1253 5\' 2"  (1.575 m)     Head Circumference --      Peak Flow --      Pain Score 01/15/18 1250 10     Pain Loc --      Pain Edu? --      Excl. in GC? --      Constitutional: Alert and oriented. Well appearing and in no acute distress. Eyes: Conjunctivae are normal. PERRL. EOMI. No discharge. Head: Atraumatic. ENT: No frontal and maxillary sinus tenderness.      Ears: Tympanic membranes pearly  gray with good landmarks. No discharge.      Nose: No congestion/rhinnorhea.      Mouth/Throat: Mucous membranes are moist. Oropharynx erythematous. Tonsils mildly enlarged bilaterally, worse on the left. No visible abscess. No exudates. Uvula midline.  Neck: No stridor.   Hematological/Lymphatic/Immunilogical: No cervical lymphadenopathy. Cardiovascular: Normal rate, regular rhythm.  Good peripheral circulation. Respiratory: Normal respiratory effort without tachypnea or retractions. Lungs CTAB. Good air entry to the bases with no decreased or absent breath sounds. Gastrointestinal: Bowel sounds 4 quadrants. Soft and nontender to palpation. No guarding or  rigidity. No palpable masses. No distention. Musculoskeletal: Full range of motion to all extremities. No gross deformities appreciated. Neurologic:  Normal speech and language. No gross focal neurologic deficits are appreciated.  Skin:  Skin is warm, dry and intact. No rash noted.   ____________________________________________   LABS (all labs ordered are listed, but only abnormal results are displayed)  Labs Reviewed  URINALYSIS, COMPLETE (UACMP) WITH MICROSCOPIC - Abnormal; Notable for the following components:      Result Value   Color, Urine YELLOW (*)    APPearance HAZY (*)    Protein, ur 30 (*)    Bacteria, UA RARE (*)    Squamous Epithelial / LPF 6-30 (*)    All other components within normal limits  GROUP A STREP BY PCR  URINE CULTURE  INFLUENZA PANEL BY PCR (TYPE A & B)   ____________________________________________  EKG   ____________________________________________  RADIOLOGY Lexine Baton, personally viewed and evaluated these images (plain radiographs) as part of my medical decision making, as well as reviewing the written report by the radiologist.  Dg Chest 2 View  Result Date: 01/15/2018 CLINICAL DATA:  Fever and sore throat. EXAM: CHEST  2 VIEW COMPARISON:  05/03/2017 FINDINGS: The heart size and mediastinal contours are within normal limits. Both lungs are clear. The visualized skeletal structures are unremarkable. IMPRESSION: No active cardiopulmonary disease. Electronically Signed   By: Kennith Center M.D.   On: 01/15/2018 13:24    ____________________________________________    PROCEDURES  Procedure(s) performed:    Procedures    Medications  acetaminophen (TYLENOL) tablet 650 mg (650 mg Oral Given 01/15/18 1511)  ipratropium-albuterol (DUONEB) 0.5-2.5 (3) MG/3ML nebulizer solution 3 mL (3 mLs Nebulization Given 01/15/18 1417)  sodium chloride 0.9 % bolus 1,000 mL (0 mLs Intravenous Stopped 01/15/18 1615)  amoxicillin-clavulanate  (AUGMENTIN) 875-125 MG per tablet 1 tablet (1 tablet Oral Given 01/15/18 1511)     ____________________________________________   INITIAL IMPRESSION / ASSESSMENT AND PLAN / ED COURSE  Pertinent labs & imaging results that were available during my care of the patient were reviewed by me and considered in my medical decision making (see chart for details).  Review of the [place] CSRS was performed in accordance of the [facility] prior to dispensing any controlled drugs.   Patient's diagnosis is consistent with tonsillitis. Vital signs and exam are reassuring. Patient appears well..  Chest x-ray negative for acute cardiopulmonary processes.  Influenza and flu are negative.  Urinalysis shows rare bacteria and is likely contamination.  It will be sent for culture.  She is not having any urinary symptoms.  She was given IV fluids in ER and felt better after.  She was given a dose of Augmentin.  Patient should alternate tylenol and ibuprofen for fever. Patient feels comfortable going home. Patient will be discharged home with prescriptions for Augmentin and viscous lidocaine. Patient is to follow up with PCP as needed or otherwise directed. Patient  is given ED precautions to return to the ED for any worsening or new symptoms.     ____________________________________________  FINAL CLINICAL IMPRESSION(S) / ED DIAGNOSES  Final diagnoses:  Tonsillitis      NEW MEDICATIONS STARTED DURING THIS VISIT:  ED Discharge Orders        Ordered    amoxicillin-clavulanate (AUGMENTIN) 875-125 MG tablet  2 times daily     01/15/18 1602    lidocaine (XYLOCAINE) 2 % solution  As needed     01/15/18 1602          This chart was dictated using voice recognition software/Dragon. Despite best efforts to proofread, errors can occur which can change the meaning. Any change was purely unintentional.    Enid DerryWagner, Lashica Hannay, PA-C 01/15/18 1622    Jeanmarie PlantMcShane, James A, MD 01/16/18 1504

## 2018-01-15 NOTE — ED Notes (Signed)
Body aches, fever, chills, sore throat X 3 days.

## 2018-01-15 NOTE — ED Triage Notes (Signed)
Pt reports that she has is having bodyaches, fever and sore throat for the last two days.

## 2018-01-17 LAB — URINE CULTURE

## 2018-07-22 ENCOUNTER — Encounter (HOSPITAL_COMMUNITY): Payer: Self-pay | Admitting: Emergency Medicine

## 2018-07-22 ENCOUNTER — Emergency Department
Admission: EM | Admit: 2018-07-22 | Discharge: 2018-07-22 | Disposition: A | Discharge status: Home / Self Care | Payer: Managed Care, Other (non HMO) | Attending: Emergency Medicine | Admitting: Emergency Medicine

## 2018-07-22 ENCOUNTER — Other Ambulatory Visit: Payer: Self-pay

## 2018-07-22 ENCOUNTER — Encounter: Payer: Self-pay | Admitting: Emergency Medicine

## 2018-07-22 ENCOUNTER — Emergency Department (HOSPITAL_COMMUNITY)
Admission: EM | Admit: 2018-07-22 | Discharge: 2018-07-22 | Disposition: A | Discharge status: Home / Self Care | Payer: Managed Care, Other (non HMO) | Attending: Emergency Medicine | Admitting: Emergency Medicine

## 2018-07-22 DIAGNOSIS — R110 Nausea: Secondary | ICD-10-CM

## 2018-07-22 DIAGNOSIS — R112 Nausea with vomiting, unspecified: Secondary | ICD-10-CM

## 2018-07-22 DIAGNOSIS — I10 Essential (primary) hypertension: Secondary | ICD-10-CM | POA: Diagnosis not present

## 2018-07-22 DIAGNOSIS — E860 Dehydration: Secondary | ICD-10-CM | POA: Diagnosis not present

## 2018-07-22 LAB — LIPASE, BLOOD: Lipase: 41 U/L (ref 11–51)

## 2018-07-22 LAB — URINALYSIS, ROUTINE W REFLEX MICROSCOPIC
BILIRUBIN URINE: NEGATIVE
GLUCOSE, UA: NEGATIVE mg/dL
KETONES UR: 20 mg/dL — AB
LEUKOCYTES UA: NEGATIVE
Nitrite: NEGATIVE
Protein, ur: 100 mg/dL — AB
Specific Gravity, Urine: 1.024 (ref 1.005–1.030)
pH: 7 (ref 5.0–8.0)

## 2018-07-22 LAB — COMPREHENSIVE METABOLIC PANEL
ALK PHOS: 116 U/L (ref 38–126)
ALT: 19 U/L (ref 0–44)
ANION GAP: 8 (ref 5–15)
AST: 19 U/L (ref 15–41)
Albumin: 4.6 g/dL (ref 3.5–5.0)
BUN: 11 mg/dL (ref 6–20)
CALCIUM: 9.5 mg/dL (ref 8.9–10.3)
CO2: 23 mmol/L (ref 22–32)
CREATININE: 0.82 mg/dL (ref 0.44–1.00)
Chloride: 103 mmol/L (ref 98–111)
Glucose, Bld: 106 mg/dL — ABNORMAL HIGH (ref 70–99)
Potassium: 4.5 mmol/L (ref 3.5–5.1)
Sodium: 134 mmol/L — ABNORMAL LOW (ref 135–145)
Total Bilirubin: 1.1 mg/dL (ref 0.3–1.2)
Total Protein: 8.3 g/dL — ABNORMAL HIGH (ref 6.5–8.1)

## 2018-07-22 LAB — CBC
HCT: 44.9 % (ref 36.0–46.0)
HEMOGLOBIN: 14.6 g/dL (ref 12.0–15.0)
MCH: 23 pg — ABNORMAL LOW (ref 26.0–34.0)
MCHC: 32.5 g/dL (ref 30.0–36.0)
MCV: 70.8 fL — ABNORMAL LOW (ref 78.0–100.0)
PLATELETS: 188 10*3/uL (ref 150–400)
RBC: 6.34 MIL/uL — AB (ref 3.87–5.11)
RDW: 13.8 % (ref 11.5–15.5)
WBC: 13 10*3/uL — AB (ref 4.0–10.5)

## 2018-07-22 LAB — PREGNANCY, URINE: Preg Test, Ur: NEGATIVE

## 2018-07-22 MED ORDER — ONDANSETRON HCL 4 MG/2ML IJ SOLN
4.0000 mg | Freq: Once | INTRAMUSCULAR | Status: AC
  Administered 2018-07-22: 4 mg via INTRAVENOUS
  Filled 2018-07-22: qty 2

## 2018-07-22 MED ORDER — ONDANSETRON 4 MG PO TBDP
ORAL_TABLET | ORAL | Status: AC
  Administered 2018-07-22: 4 mg
  Filled 2018-07-22: qty 1

## 2018-07-22 MED ORDER — SODIUM CHLORIDE 0.9 % IV BOLUS
1000.0000 mL | Freq: Once | INTRAVENOUS | Status: AC
  Administered 2018-07-22: 1000 mL via INTRAVENOUS

## 2018-07-22 MED ORDER — PROMETHAZINE HCL 25 MG PO TABS: 25.0000 mg | ORAL_TABLET | Freq: Four times a day (QID) | ORAL | 0 refills | Status: DC | PRN

## 2018-07-22 MED ORDER — PROMETHAZINE HCL 25 MG/ML IJ SOLN
25.0000 mg | Freq: Once | INTRAMUSCULAR | Status: AC
  Administered 2018-07-22: 25 mg via INTRAVENOUS
  Filled 2018-07-22: qty 1

## 2018-07-22 MED ORDER — ONDANSETRON HCL 4 MG/2ML IJ SOLN: 4.0000 mg | Freq: Once | INTRAMUSCULAR | Status: DC

## 2018-07-22 MED ORDER — ONDANSETRON 4 MG PO TBDP: 4.0000 mg | ORAL_TABLET | Freq: Three times a day (TID) | ORAL | 0 refills | Status: DC | PRN

## 2018-07-22 NOTE — ED Triage Notes (Signed)
Pt states she got her [facility] where they had to "dig" in her arm to remove it, pt states it took about 2 hours, arm sore today. While at work this am, pt nauseated and vomited once, here now for nausea, appears in NAD.

## 2018-07-22 NOTE — ED Triage Notes (Addendum)
Had birth control removed from her arm yesterday, c/o vomiting since 0700 today.  [facility] and not given any medication.  Pt reports lower abd pain since 0700 as well, denies any urinary s/s. Took zofran around noon with no relief.

## 2018-07-22 NOTE — ED Provider Notes (Signed)
[facility] EMERGENCY DEPARTMENT Provider Note   CSN: [id number] Arrival date & time: 07/22/18  1534     History   Chief Complaint Chief Complaint  Patient presents with  . Emesis    HPI Michelle Thompson is a 22 y.o. female.  Pt presents to the ED today with n/v.  She had her nexplanon removed from her left arm yesterday.  Since then, she has had n/v.  She has zofran odt which she's tried, but that does not work.  Pt went to [facility] ED today who told her to take the zofran.  She is still not any better, so she came here.  The pt c/o mild upper abd pain.  No f/c.      Past Medical History:  Diagnosis Date  . Hypertension     There are no active problems to display for this patient.   History reviewed. No pertinent surgical history.   OB History   None      Home Medications    Prior to Admission medications   Medication Sig Start Date End Date Taking? Authorizing Provider  cephALEXin (KEFLEX) 500 MG capsule Take 500 mg by mouth 3 (three) times daily. 5 day course 07/21/18   [provider]  ondansetron (ZOFRAN ODT) 4 MG disintegrating tablet Take 1 tablet (4 mg total) by mouth every 8 (eight) hours as needed for nausea or vomiting. 07/22/18   Jene Every, MD  promethazine (PHENERGAN) 25 MG tablet Take 1 tablet (25 mg total) by mouth every 6 (six) hours as needed for nausea or vomiting. 07/22/18   Jacalyn Lefevre, MD    Family History No family history on file.  Social History Social History   Tobacco Use  . Smoking status: Never Smoker  . Smokeless tobacco: Never Used  Substance Use Topics  . Alcohol use: No  . Drug use: No     Allergies   Motrin [ibuprofen]   Review of Systems Review of Systems  Gastrointestinal: Positive for abdominal pain, nausea and vomiting.  All other systems reviewed and are negative.    Physical Exam Updated Vital Signs BP 119/79 (BP Location: Right Arm)   Pulse 99   Temp 98.1 F (36.7 C) (Oral)   Resp 16    Ht 5\' 2"  (1.575 m)   Wt 68.9 kg (152 lb)   SpO2 98%   BMI 27.80 kg/m   Physical Exam  Constitutional: She is oriented to person, place, and time. She appears well-developed and well-nourished.  HENT:  Head: Normocephalic and atraumatic.  Right Ear: External ear normal.  Left Ear: External ear normal.  Nose: Nose normal.  Mouth/Throat: Mucous membranes are dry.  Eyes: Pupils are equal, round, and reactive to light. Conjunctivae and EOM are normal.  Neck: Normal range of motion. Neck supple.  Cardiovascular: Regular rhythm, normal heart sounds and intact distal pulses. Tachycardia present.  Pulmonary/Chest: Effort normal and breath sounds normal.  Abdominal: Soft. Bowel sounds are normal.  Musculoskeletal: Normal range of motion.  Neurological: She is alert and oriented to person, place, and time.  Skin: Skin is warm. Capillary refill takes less than 2 seconds.  Psychiatric: She has a normal mood and affect. Her behavior is normal. Judgment and thought content normal.  Nursing note and vitals reviewed.    ED Treatments / Results  Labs (all labs ordered are listed, but only abnormal results are displayed) Labs Reviewed  COMPREHENSIVE METABOLIC PANEL - Abnormal; Notable for the following components:  Result Value   Sodium 134 (*)    Glucose, Bld 106 (*)    Total Protein 8.3 (*)    All other components within normal limits  CBC - Abnormal; Notable for the following components:   WBC 13.0 (*)    RBC 6.34 (*)    MCV 70.8 (*)    MCH 23.0 (*)    All other components within normal limits  URINALYSIS, ROUTINE W REFLEX MICROSCOPIC - Abnormal; Notable for the following components:   APPearance HAZY (*)    Hgb urine dipstick SMALL (*)    Ketones, ur 20 (*)    Protein, ur 100 (*)    Bacteria, UA RARE (*)    All other components within normal limits  LIPASE, BLOOD  PREGNANCY, URINE    EKG None  Radiology No results found.  Procedures Procedures (including critical  care time)  Medications Ordered in ED Medications  sodium chloride 0.9 % bolus 1,000 mL (1,000 mLs Intravenous New Bag/Given 07/22/18 1830)  promethazine (PHENERGAN) injection 25 mg (25 mg Intravenous Given 07/22/18 1830)  ondansetron (ZOFRAN) injection 4 mg (4 mg Intravenous Given 07/22/18 1916)     Initial Impression / Assessment and Plan / ED Course  I have reviewed the triage vital signs and the nursing notes.  Pertinent labs & imaging results that were available during my care of the patient were reviewed by me and considered in my medical decision making (see chart for details).    Pt is feeling much better.  She is able to tolerate po fluids.  Return if worse.  Final Clinical Impressions(s) / ED Diagnoses   Final diagnoses:  Dehydration  Non-intractable vomiting with nausea, unspecified vomiting type    ED Discharge Orders        Ordered    promethazine (PHENERGAN) 25 MG tablet  Every 6 hours PRN     07/22/18 Gillis Santa2005       Sharonda Llamas, MD 07/22/18 2006

## 2018-07-22 NOTE — ED Notes (Signed)
ED Provider at bedside. 

## 2018-07-22 NOTE — ED Provider Notes (Signed)
[facility] Emergency Department Provider Note   ____________________________________________    I have reviewed the triage vital signs and the nursing notes.   HISTORY  Chief Complaint Nausea and Emesis     HPI Michelle Thompson is a 22 y.o. female who presents with complaints of nausea.  Patient reports she had Nexplanon removed [facility].  She stated that it took them a while to get it removed and that they "had to take around in her arm ".  Afterwards she started to feel nauseated.  She continues to feel nauseated today.  She denies abdominal pain.  No diarrhea.  No body aches.  No fevers or chills.  Has been on birth control.   Past Medical History:  Diagnosis Date  . Hypertension     There are no active problems to display for this patient.   History reviewed. No pertinent surgical history.  Prior to Admission medications   Medication Sig Start Date End Date Taking? Authorizing Provider  etonogestrel (NEXPLANON) 68 MG IMPL implant 1 each by Subdermal route once.    [provider]  lidocaine (XYLOCAINE) 2 % solution Use as directed 10 mLs in the mouth or throat as needed for mouth pain. 01/15/18   Enid Derry, PA-C  ondansetron (ZOFRAN ODT) 4 MG disintegrating tablet Take 1 tablet (4 mg total) by mouth every 8 (eight) hours as needed for nausea or vomiting. 07/22/18   Jene Every, MD  traMADol (ULTRAM) 50 MG tablet Take 1 tablet (50 mg total) by mouth every 6 (six) hours as needed. 12/06/17   Triplett, Rulon Eisenmenger B, FNP     Allergies Motrin [ibuprofen]  No family history on file.  Social History Social History   Tobacco Use  . Smoking status: Never Smoker  . Smokeless tobacco: Never Used  Substance Use Topics  . Alcohol use: No  . Drug use: No    Review of Systems  Constitutional: No fever/chills Eyes: No visual changes.  ENT: No sore throat. Cardiovascular: Denies chest pain. Respiratory: No  cough Gastrointestinal: As above.   Genitourinary: Negative for dysuria. Musculoskeletal: No myalgias Skin: Negative for rash. Neurological: Negative for headache   ____________________________________________   PHYSICAL EXAM:  VITAL SIGNS: ED Triage Vitals  Enc Vitals Group     BP 07/22/18 0900 130/84     Pulse Rate 07/22/18 0900 96     Resp 07/22/18 0915 18     Temp 07/22/18 0900 98.2 F (36.8 C)     Temp Source 07/22/18 0900 Oral     SpO2 07/22/18 0900 97 %     Weight 07/22/18 0850 68.9 kg (152 lb)     Height 07/22/18 0850 1.575 m (5\' 2" )     Head Circumference --      Peak Flow --      Pain Score 07/22/18 0850 8     Pain Loc --      Pain Edu? --      Excl. in GC? --     Constitutional: Alert and oriented. No acute distress. Pleasant and interactive  Nose: No congestion/rhinnorhea.  Cardiovascular: Normal rate, regular rhythm.  Good peripheral circulation. Respiratory: Normal respiratory effort.  No retractions. Gastrointestinal: Soft and nontender. No distention.  No CVA tenderness.  Musculoskeletal:  Warm and well perfused Neurologic:  Normal speech and language. No gross focal neurologic deficits are appreciated.  Skin:  Skin is warm, dry and intact.  No evidence of infection on left arm,  clean dry healing wound Psychiatric: Mood and affect are normal. Speech and behavior are normal.  ____________________________________________   LABS (all labs ordered are listed, but only abnormal results are displayed)  Labs Reviewed - No data to display ____________________________________________  EKG  None ____________________________________________  RADIOLOGY None ________________________________________   PROCEDURES  Procedure(s) performed: No  Procedures   Critical Care performed: No ____________________________________________   INITIAL IMPRESSION / ASSESSMENT AND PLAN / ED COURSE  Pertinent labs & imaging results that were available during  my care of the patient were reviewed by me and considered in my medical decision making (see chart for details).  Patient quite well-appearing in no acute distress.  No abdominal tenderness no abdominal pain.  Mild nausea.  Will treat with ODT Zofran, outpatient follow-up.  No indication for labs or further work-up at this time    ____________________________________________   FINAL CLINICAL IMPRESSION(S) / ED DIAGNOSES  Final diagnoses:  Nausea        Note:  This document was prepared using Dragon voice recognition software and may include unintentional dictation errors.   Jene EveryKinner, Johnson Arizola, MD 07/22/18 [id number]

## 2019-09-18 ENCOUNTER — Encounter (HOSPITAL_COMMUNITY): Payer: Self-pay | Admitting: Emergency Medicine

## 2019-09-18 ENCOUNTER — Ambulatory Visit (HOSPITAL_COMMUNITY)
Admission: EM | Admit: 2019-09-18 | Discharge: 2019-09-18 | Disposition: A | Discharge status: Home / Self Care | Payer: Managed Care, Other (non HMO) | Attending: Family Medicine | Admitting: Family Medicine

## 2019-09-18 ENCOUNTER — Other Ambulatory Visit: Payer: Self-pay

## 2019-09-18 DIAGNOSIS — R1031 Right lower quadrant pain: Secondary | ICD-10-CM

## 2019-09-18 NOTE — Discharge Instructions (Signed)
Please be seen in the emergency room as you may have appendicitis.  You may need a CT scan.

## 2019-09-18 NOTE — ED Triage Notes (Signed)
Pt has been experiencing RLQ sharp pain since earlier today.  Pt is breathing very hard and appears to be in a lot of pain. 10/10 pain

## 2019-09-18 NOTE — ED Provider Notes (Signed)
[facility]    CSN: [id number] Arrival date & time: 09/18/19  1858      History   Chief Complaint Chief Complaint  Patient presents with  . Abdominal Pain    HPI Eustace PenCourtney Fausto is a 23 y.o. female.   She is presenting with right lower quadrant abdominal pain.  This started earlier today.  Is becoming constant and severe.  Is localized to this region.  She denies any changes in voids and has had normal bowel movements.  No history abdominal surgery.  No fever.  No trauma to the abdomen.  She finished her cycle roughly 5 days ago.  Does not seem to be associated with food.  Has not eaten newer or different foods.  Has not started any new or different medications.  HPI  Past Medical History:  Diagnosis Date  . Hypertension     There are no active problems to display for this patient.   History reviewed. No pertinent surgical history.  OB History   No obstetric history on file.      Home Medications    Prior to Admission medications   Medication Sig Start Date End Date Taking? Authorizing Provider  cephALEXin (KEFLEX) 500 MG capsule Take 500 mg by mouth 3 (three) times daily. 5 day course 07/21/18   [provider]  ondansetron (ZOFRAN ODT) 4 MG disintegrating tablet Take 1 tablet (4 mg total) by mouth every 8 (eight) hours as needed for nausea or vomiting. 07/22/18   Jene EveryKinner, Robert, MD  promethazine (PHENERGAN) 25 MG tablet Take 1 tablet (25 mg total) by mouth every 6 (six) hours as needed for nausea or vomiting. 07/22/18   Jacalyn LefevreHaviland, Julie, MD    Family History History reviewed. No pertinent family history.  Social History Social History   Tobacco Use  . Smoking status: Never Smoker  . Smokeless tobacco: Never Used  Substance Use Topics  . Alcohol use: No  . Drug use: No     Allergies   Motrin [ibuprofen]   Review of Systems Review of Systems  Constitutional: Negative for fever.  HENT: Negative for congestion.   Respiratory: Negative  for cough.   Cardiovascular: Negative for chest pain.  Gastrointestinal: Positive for abdominal pain. Negative for constipation, diarrhea and vomiting.  Musculoskeletal: Negative for back pain.  Skin: Negative for color change.  Neurological: Negative for weakness.  Hematological: Negative for adenopathy.     Physical Exam Triage Vital Signs ED Triage Vitals  Enc Vitals Group     BP 09/18/19 1910 134/87     Pulse Rate 09/18/19 1910 99     Resp 09/18/19 1910 (!) 24     Temp 09/18/19 1910 98.7 F (37.1 C)     Temp Source 09/18/19 1910 Oral     SpO2 09/18/19 1910 96 %     Weight --      Height --      Head Circumference --      Peak Flow --      Pain Score 09/18/19 1908 10     Pain Loc --      Pain Edu? --      Excl. in GC? --    No data found.  Updated Vital Signs BP 134/87 (BP Location: Left Arm)   Pulse 99   Temp 98.7 F (37.1 C) (Oral)   Resp (!) 24   LMP 09/07/2019 (Exact Date)   SpO2 96%   Visual Acuity Right Eye Distance:   Left Eye  Distance:   Bilateral Distance:    Right Eye Near:   Left Eye Near:    Bilateral Near:     Physical Exam Gen: NAD, alert, cooperative with exam,  ENT: normal lips, normal nasal mucosa,  Eye: normal EOM, normal conjunctiva and lids CV:  no edema, +2 pedal pulses   Resp: no accessory muscle use, non-labored,  GI: Soft, nontender, nondistended, positive bowel sounds, significant tenderness the right lower quadrant.  Positive Rovsing sign Skin: no rashes, no areas of induration  Neuro: normal tone, normal sensation to touch Psych:  normal insight, alert and oriented MSK: Normal gait, normal strength   UC Treatments / Results  Labs (all labs ordered are listed, but only abnormal results are displayed) Labs Reviewed - No data to display  EKG   Radiology No results found.  Procedures Procedures (including critical care time)  Medications Ordered in UC Medications - No data to display  Initial Impression /  Assessment and Plan / UC Course  I have reviewed the triage vital signs and the nursing notes.  Pertinent labs & imaging results that were available during my care of the patient were reviewed by me and considered in my medical decision making (see chart for details).     Natiya is a 23 yo F that is presenting with RLQ abdominal pain. Concern for appendicitis given severity and ongoing duration. Advised to be seen in the ED for the possible need for a CT scan.    Final Clinical Impressions(s) / UC Diagnoses   Final diagnoses:  Right lower quadrant abdominal pain     Discharge Instructions     Please be seen in the emergency room as you may have appendicitis.  You may need a CT scan.     ED Prescriptions    None     PDMP not reviewed this encounter.   Rosemarie Ax, MD 09/18/19 1946

## 2019-12-09 ENCOUNTER — Inpatient Hospital Stay (HOSPITAL_COMMUNITY)
Admission: EM | Admit: 2019-12-09 | Discharge: 2019-12-09 | Disposition: A | Discharge status: Home / Self Care | Payer: Managed Care, Other (non HMO) | Attending: Obstetrics and Gynecology | Admitting: Obstetrics and Gynecology

## 2019-12-09 ENCOUNTER — Inpatient Hospital Stay (HOSPITAL_COMMUNITY): Payer: Managed Care, Other (non HMO)

## 2019-12-09 ENCOUNTER — Other Ambulatory Visit: Payer: Self-pay

## 2019-12-09 ENCOUNTER — Encounter (HOSPITAL_COMMUNITY): Payer: Self-pay | Admitting: *Deleted

## 2019-12-09 DIAGNOSIS — Z3A01 Less than 8 weeks gestation of pregnancy: Secondary | ICD-10-CM | POA: Diagnosis not present

## 2019-12-09 DIAGNOSIS — O209 Hemorrhage in early pregnancy, unspecified: Secondary | ICD-10-CM | POA: Insufficient documentation

## 2019-12-09 DIAGNOSIS — O2341 Unspecified infection of urinary tract in pregnancy, first trimester: Secondary | ICD-10-CM

## 2019-12-09 DIAGNOSIS — N939 Abnormal uterine and vaginal bleeding, unspecified: Secondary | ICD-10-CM

## 2019-12-09 DIAGNOSIS — O3680X0 Pregnancy with inconclusive fetal viability, not applicable or unspecified: Secondary | ICD-10-CM | POA: Insufficient documentation

## 2019-12-09 DIAGNOSIS — R109 Unspecified abdominal pain: Secondary | ICD-10-CM | POA: Insufficient documentation

## 2019-12-09 DIAGNOSIS — Z79899 Other long term (current) drug therapy: Secondary | ICD-10-CM | POA: Diagnosis not present

## 2019-12-09 DIAGNOSIS — O26891 Other specified pregnancy related conditions, first trimester: Secondary | ICD-10-CM | POA: Diagnosis not present

## 2019-12-09 DIAGNOSIS — O10011 Pre-existing essential hypertension complicating pregnancy, first trimester: Secondary | ICD-10-CM | POA: Insufficient documentation

## 2019-12-09 LAB — URINALYSIS, ROUTINE W REFLEX MICROSCOPIC
Bilirubin Urine: NEGATIVE
Glucose, UA: NEGATIVE mg/dL
Ketones, ur: NEGATIVE mg/dL
Nitrite: NEGATIVE
Protein, ur: NEGATIVE mg/dL
Specific Gravity, Urine: <1.005 — ABNORMAL LOW (ref 1.005–1.030)
pH: 7 (ref 5.0–8.0)

## 2019-12-09 LAB — CBC
HCT: 38 % (ref 36.0–46.0)
Hemoglobin: 12.2 g/dL (ref 12.0–15.0)
MCH: 23.1 pg — ABNORMAL LOW (ref 26.0–34.0)
MCHC: 32.1 g/dL (ref 30.0–36.0)
MCV: 72.1 fL — ABNORMAL LOW (ref 80.0–100.0)
Platelets: 180 10*3/uL (ref 150–400)
RBC: 5.27 MIL/uL — ABNORMAL HIGH (ref 3.87–5.11)
RDW: 13.4 % (ref 11.5–15.5)
WBC: 12.3 10*3/uL — ABNORMAL HIGH (ref 4.0–10.5)
nRBC: 0 % (ref 0.0–0.2)

## 2019-12-09 LAB — URINALYSIS, MICROSCOPIC (REFLEX)

## 2019-12-09 LAB — WET PREP, GENITAL
Clue Cells Wet Prep HPF POC: NONE SEEN
Sperm: NONE SEEN
Trich, Wet Prep: NONE SEEN
Yeast Wet Prep HPF POC: NONE SEEN

## 2019-12-09 LAB — POC URINE PREG, ED: Preg Test, Ur: POSITIVE — AB

## 2019-12-09 LAB — HCG, QUANTITATIVE, PREGNANCY: hCG, Beta Chain, Quant, S: 22164 m[IU]/mL — ABNORMAL HIGH (ref <5)

## 2019-12-09 MED ORDER — CEPHALEXIN 500 MG PO CAPS: 500.0000 mg | ORAL_CAPSULE | Freq: Four times a day (QID) | ORAL | 0 refills | Status: DC

## 2019-12-09 NOTE — MAU Note (Signed)
Pt reports she she has had some vag bleeding not sure if it is vaginal or bladder. and cramping that stared around 3:15am. Stated she had difficulty starting her stream and then she said a whole lot of blood came out . Having frequency and urgency and pain with urination.

## 2019-12-09 NOTE — MAU Provider Note (Addendum)
History     CSN: [id number]  Arrival date and time: 12/09/19 0534   First Provider Initiated Contact with Patient 12/09/19 0736      Chief Complaint  Patient presents with  . Vaginal Bleeding   Michelle Thompson is a 23 y.o. G2P1001 at [redacted]w[redacted]d by definite LMP of Oct 31, 2019 who plans to receive care at [facility].  She presents today for Vaginal Bleeding.  Patient states she started feeling pain in her stomach around 0315.  She states she went to the bathroom at 0415 and noticed some spotting.  She states she got up and went to the ER, but didn't want to wait.  She then went to [place], to use the restroom, and noticed blood in "my urine."  She verifies that she is unsure of whether the blood was coming from her vagina or her urethra.  Patient states the pain in her stomach has since resolved, but the blood remained upon arrival the the MAU. She reports sexual activity in the past 3 days, but denies vaginal discharge prior to the onset of bleeding.      OB History    Gravida  2   Para  1   Term  1   Preterm      AB      Living  1     SAB      TAB      Ectopic      Multiple      Live Births  1           Past Medical History:  Diagnosis Date  . Hypertension     History reviewed. No pertinent surgical history.  History reviewed. No pertinent family history.  Social History   Tobacco Use  . Smoking status: Never Smoker  . Smokeless tobacco: Never Used  Substance Use Topics  . Alcohol use: No  . Drug use: No    Allergies:  Allergies  Allergen Reactions  . Motrin [Ibuprofen] Other (See Comments)    Unknown reaction-childhood    Medications Prior to Admission  Medication Sig Dispense Refill Last Dose  . Prenatal Vit-Fe Fumarate-FA (PRENATAL MULTIVITAMIN) TABS tablet Take 1 tablet by mouth daily at 12 noon.   12/08/2019 at Unknown time  . cephALEXin (KEFLEX) 500 MG capsule Take 500 mg by mouth 3 (three) times daily. 5 day course  0   .  ondansetron (ZOFRAN ODT) 4 MG disintegrating tablet Take 1 tablet (4 mg total) by mouth every 8 (eight) hours as needed for nausea or vomiting. 20 tablet 0   . promethazine (PHENERGAN) 25 MG tablet Take 1 tablet (25 mg total) by mouth every 6 (six) hours as needed for nausea or vomiting. 10 tablet 0     Review of Systems  Constitutional: Negative for chills and fever.  Respiratory: Negative for cough and shortness of breath.   Gastrointestinal: Positive for abdominal pain (Lower-cramping). Negative for constipation, diarrhea, nausea and vomiting.  Genitourinary: Positive for vaginal bleeding. Negative for difficulty urinating, dysuria, pelvic pain and vaginal discharge.  Musculoskeletal: Negative for back pain.  Neurological: Negative for dizziness, light-headedness and headaches.   Physical Exam   Blood pressure 139/62, pulse 85, temperature 98.3 F (36.8 C), temperature source Oral, resp. rate 18, height 5\' 2"  (1.575 m), weight 73.9 kg, last menstrual period 10/31/2019, SpO2 99 %.  Physical Exam  Constitutional: She is oriented to person, place, and time. She appears well-developed and well-nourished.  HENT:  Head: Normocephalic and  atraumatic.  Eyes: Conjunctivae are normal.  Neck: Normal range of motion.  Cardiovascular: Normal rate, regular rhythm and normal heart sounds.  Respiratory: Effort normal and breath sounds normal.  GI: Soft. There is no abdominal tenderness.  Genitourinary: Cervix exhibits no motion tenderness and no friability.    No vaginal discharge or bleeding.  No bleeding in the vagina.    Genitourinary Comments: Speculum Exam: -Normal External Genitalia: Non tender, no apparent discharge at introitus.  -Vaginal Vault: Pink mucosa with good rugae. No apparent discharge in vault -wet prep collected -Cervix:Pink, no lesions, cysts, or polyps.  Appears closed. No active bleeding from os-GC/CT collected -Bimanual Exam:  Closed Unable to assess uterus size and  position d/t body habitus and panus.    Neurological: She is alert and oriented to person, place, and time.  Skin: Skin is warm and dry.  Psychiatric: She has a normal mood and affect. Her behavior is normal.    MAU Course  Procedures Results for orders placed or performed during the hospital encounter of 12/09/19 (from the past 24 hour(s))  POC Urine Pregnancy, ED (not at [facility])     Status: Abnormal   Collection Time: 12/09/19  5:53 AM  Result Value Ref Range   Preg Test, Ur POSITIVE (A) NEGATIVE  Urinalysis, Routine w reflex microscopic     Status: Abnormal   Collection Time: 12/09/19  6:26 AM  Result Value Ref Range   Color, Urine COLORLESS (A) YELLOW   APPearance HAZY (A) CLEAR   Specific Gravity, Urine <1.005 (L) 1.005 - 1.030   pH 7.0 5.0 - 8.0   Glucose, UA NEGATIVE NEGATIVE mg/dL   Hgb urine dipstick LARGE (A) NEGATIVE   Bilirubin Urine NEGATIVE NEGATIVE   Ketones, ur NEGATIVE NEGATIVE mg/dL   Protein, ur NEGATIVE NEGATIVE mg/dL   Nitrite NEGATIVE NEGATIVE   Leukocytes,Ua MODERATE (A) NEGATIVE  Urinalysis, Microscopic (reflex)     Status: Abnormal   Collection Time: 12/09/19  6:26 AM  Result Value Ref Range   RBC / HPF 6-10 0 - 5 RBC/hpf   WBC, UA 11-20 0 - 5 WBC/hpf   Bacteria, UA FEW (A) NONE SEEN   Squamous Epithelial / LPF 0-5 0 - 5   WBC Clumps PRESENT    Mucus PRESENT   CBC     Status: Abnormal   Collection Time: 12/09/19  7:33 AM  Result Value Ref Range   WBC 12.3 (H) 4.0 - 10.5 K/uL   RBC 5.27 (H) 3.87 - 5.11 MIL/uL   Hemoglobin 12.2 12.0 - 15.0 g/dL   HCT 82.938.0 56.236.0 - 13.046.0 %   MCV 72.1 (L) 80.0 - 100.0 fL   MCH 23.1 (L) 26.0 - 34.0 pg   MCHC 32.1 30.0 - 36.0 g/dL   RDW 86.513.4 78.411.5 - 69.615.5 %   Platelets 180 150 - 400 K/uL   nRBC 0.0 0.0 - 0.2 %  hCG, quantitative, pregnancy     Status: Abnormal   Collection Time: 12/09/19  7:33 AM  Result Value Ref Range   hCG, Beta Chain, Quant, S 22,164 (H) <5 mIU/mL  ABO/Rh     Status: None   Collection Time:  12/09/19  7:33 AM  Result Value Ref Range   ABO/RH(D)      O POS Performed at [facility], [address] [place], [place] [address]   Wet prep, genital     Status: Abnormal   Collection Time: 12/09/19  7:51 AM   Specimen: Cervix  Result Value Ref Range   Yeast Wet Prep HPF POC NONE SEEN NONE SEEN   Trich, Wet Prep NONE SEEN NONE SEEN   Clue Cells Wet Prep HPF POC NONE SEEN NONE SEEN   WBC, Wet Prep HPF POC MANY (A) NONE SEEN   Sperm NONE SEEN    US Ob Less Than 14 Weeks With Ob Transvaginal  Result Date: 12/09/2019 CLINICAL DATA:  Vaginal bleeding and pelvic pain. EXAM: OBSTETRIC <14 WK [place] AND TRANSVAGINAL OB US TECHNIQUE: Both transabdominal and transvaginal ultrasound examinations were performed for complete evaluation of the gestation as well as the maternal uterus, adnexal regions, and pelvic cul-de-sac. Transvaginal technique was performed to assess early pregnancy. COMPARISON:  None. FINDINGS: Intrauterine gestational sac: Single Yolk sac:  Visualized. Embryo:  Visualized. Cardiac Activity: Not Visualized. CRL:  4 mm   6 w   0 d                  [place] EDC: 08/03/2020 Subchorionic hemorrhage:  None visualized. Maternal uterus/adnexae: Left ovarian corpus luteum cyst is noted. The ovaries are otherwise normal in appearance. No suspicious adnexal mass or abnormal free fluid identified. IMPRESSION: Single IUP measuring 6 weeks 0 days without visible embryonic cardiac activity. Findings are suspicious but not yet definitive for failed pregnancy. Recommend follow-up US in 10 days for definitive diagnosis. This recommendation follows SRU consensus guidelines: Diagnostic Criteria for Nonviable Pregnancy Early in the First Trimester. [place] Med 2013; 233:0076-22. Electronically Signed   By: Marlaine Hind M.D.   On: 12/09/2019 09:18     MDM Pelvic Exam; Wet Prep and GC/CT Labs: UA, UPT, CBC, hCG, ABO Ultrasound Assessment and Plan  23 year old G2P1001 at 5.4 weeks Vaginal  Bleeding  -Labs ordered and collected. -POC discussed. -Exam performed and findings discussed. -Cultures collected and pending.  -Will send for ultrasound. -Patient informed that due to early GA ultrasound may not show embyro yet. -Patient without questions or concerns. -Care to be given to E. Purcell Nails, NP  Maryann Conners, MSN, CNM 12/09/2019, 7:36 AM    Ultrasound shows IUP. CRL 88mm and no cardiac activity.  Pt will need viability ultrasound in 10 days. She has follow up scheduled with [place] with [facility]; she would like to follow up with them for her next ultrasound.   U/a with hemoglobin & leuks. After using the bathroom in MAU, she believes that the blood she is seeing is urinary. Also reports some discomfort with urination. Will culture urine & treat for UTI.   RH positive   A:  1. Urinary tract infection in mother during first trimester of pregnancy   2. Vaginal bleeding   3. Pregnancy with inconclusive fetal viability, single or unspecified fetus    P: Discharge home in stable condition Rx keflex GC/CT & urine culture pending F/u with [facility] for viability scan Discussed reasons to return to MAU  Jorje Guild, NP

## 2019-12-09 NOTE — Discharge Instructions (Signed)
Return to care   If you have heavier bleeding that soaks through more that 2 pads per hour for an hour or more  If you bleed so much that you feel like you might pass out or you do pass out  If you have significant abdominal pain that is not improved with Tylenol   If you develop a fever > 100.5    Pregnancy and Urinary Tract Infection  A urinary tract infection (UTI) is an infection of any part of the urinary tract. This includes the kidneys, the tubes that connect your kidneys to your bladder (ureters), the bladder, and the tube that carries urine out of your body (urethra). These organs make, store, and get rid of urine in the body. Your health care provider may use other names to describe the infection. An upper UTI affects the ureters and kidneys (pyelonephritis). A lower UTI affects the bladder (cystitis) and urethra (urethritis). Most urinary tract infections are caused by bacteria in your genital area, around the entrance to your urinary tract (urethra). These bacteria grow and cause irritation and inflammation of your urinary tract. You are more likely to develop a UTI during pregnancy because the physical and hormonal changes your body goes through can make it easier for bacteria to get into your urinary tract. Your growing baby also puts pressure on your bladder and can affect urine flow. It is important to recognize and treat UTIs in pregnancy because of the risk of serious complications for both you and your baby. How does this affect me? Symptoms of a UTI include:  Needing to urinate right away (urgently).  Frequent urination or passing small amounts of urine frequently.  Pain or burning with urination.  Blood in the urine.  Urine that smells bad or unusual.  Trouble urinating.  Cloudy urine.  Pain in the abdomen or lower back.  Vaginal discharge. You may also have:  Vomiting or a decreased appetite.  Confusion.  Irritability or tiredness.  A  fever.  Diarrhea. How does this affect my baby? An untreated UTI during pregnancy could lead to a kidney infection or a systemic infection, which can cause health problems that could affect your baby. Possible complications of an untreated UTI include:  Giving birth to your baby before 37 weeks of pregnancy (premature).  Having a baby with a low birth weight.  Developing high blood pressure during pregnancy (preeclampsia).  Having a low hemoglobin level (anemia). What can I do to lower my risk? To prevent a UTI:  Go to the bathroom as soon as you feel the need. Do not hold urine for long periods of time.  Always wipe from front to back, especially after a bowel movement. Use each tissue one time when you wipe.  Empty your bladder after sex.  Keep your genital area dry.  Drink 6-10 glasses of water each day.  Do not douche or use deodorant sprays. How is this treated? Treatment for this condition may include:  Antibiotic medicines that are safe to take during pregnancy.  Other medicines to treat less common causes of UTI. Follow these instructions at home:  If you were prescribed an antibiotic medicine, take it as told by your health care provider. Do not stop using the antibiotic even if you start to feel better.  Keep all follow-up visits as told by your health care provider. This is important. Contact a health care provider if:  Your symptoms do not improve or they get worse.  You have abnormal vaginal  discharge. Get help right away if you:  Have a fever.  Have nausea and vomiting.  Have back or side pain.  Feel contractions in your uterus.  Have lower belly pain.  Have a gush of fluid from your vagina.  Have blood in your urine. Summary  A urinary tract infection (UTI) is an infection of any part of the urinary tract, which includes the kidneys, ureters, bladder, and urethra.  Most urinary tract infections are caused by bacteria in your genital area,  around the entrance to your urinary tract (urethra).  You are more likely to develop a UTI during pregnancy.  If you were prescribed an antibiotic medicine, take it as told by your health care provider. Do not stop using the antibiotic even if you start to feel better. This information is not intended to replace advice given to you by your health care provider. Make sure you discuss any questions you have with your health care provider. Document Released: 04/13/2011 Document Revised: 04/10/2019 Document Reviewed: 11/20/2018 Elsevier Patient Education  2020 [employer].

## 2019-12-09 NOTE — ED Notes (Signed)
Pt is Para 2, [place]

## 2019-12-10 LAB — GC/CHLAMYDIA PROBE AMP (~~LOC~~) NOT AT ARMC
Chlamydia: NEGATIVE
Comment: NEGATIVE
Comment: NORMAL
Neisseria Gonorrhea: NEGATIVE

## 2019-12-10 LAB — ABO/RH: ABO/RH(D): O POS

## 2019-12-11 LAB — CULTURE, OB URINE: Culture: 60000 — AB

## 2019-12-17 ENCOUNTER — Inpatient Hospital Stay (HOSPITAL_COMMUNITY)
Admission: AD | Admit: 2019-12-17 | Discharge: 2019-12-17 | Disposition: A | Discharge status: Home / Self Care | Payer: Managed Care, Other (non HMO) | Attending: Obstetrics and Gynecology | Admitting: Obstetrics and Gynecology

## 2019-12-17 ENCOUNTER — Encounter (HOSPITAL_COMMUNITY): Payer: Self-pay | Admitting: Obstetrics and Gynecology

## 2019-12-17 DIAGNOSIS — E860 Dehydration: Secondary | ICD-10-CM | POA: Insufficient documentation

## 2019-12-17 DIAGNOSIS — O161 Unspecified maternal hypertension, first trimester: Secondary | ICD-10-CM | POA: Diagnosis not present

## 2019-12-17 DIAGNOSIS — O219 Vomiting of pregnancy, unspecified: Secondary | ICD-10-CM | POA: Insufficient documentation

## 2019-12-17 DIAGNOSIS — Z3A01 Less than 8 weeks gestation of pregnancy: Secondary | ICD-10-CM | POA: Diagnosis not present

## 2019-12-17 LAB — COMPREHENSIVE METABOLIC PANEL
ALT: 11 U/L (ref 0–44)
AST: 26 U/L (ref 15–41)
Albumin: 3.9 g/dL (ref 3.5–5.0)
Alkaline Phosphatase: 79 U/L (ref 38–126)
Anion gap: 9 (ref 5–15)
BUN: 6 mg/dL (ref 6–20)
CO2: 23 mmol/L (ref 22–32)
Calcium: 9.2 mg/dL (ref 8.9–10.3)
Chloride: 102 mmol/L (ref 98–111)
Creatinine, Ser: 0.75 mg/dL (ref 0.44–1.00)
GFR calc Af Amer: >60 mL/min (ref >60)
GFR calc non Af Amer: >60 mL/min (ref >60)
Glucose, Bld: 85 mg/dL (ref 70–99)
Potassium: 4.9 mmol/L (ref 3.5–5.1)
Sodium: 134 mmol/L — ABNORMAL LOW (ref 135–145)
Total Bilirubin: 1 mg/dL (ref 0.3–1.2)
Total Protein: 6.7 g/dL (ref 6.5–8.1)

## 2019-12-17 LAB — URINALYSIS, ROUTINE W REFLEX MICROSCOPIC
Bacteria, UA: NONE SEEN
Bilirubin Urine: NEGATIVE
Glucose, UA: NEGATIVE mg/dL
Hgb urine dipstick: NEGATIVE
Ketones, ur: 80 mg/dL — AB
Nitrite: NEGATIVE
Protein, ur: 100 mg/dL — AB
Specific Gravity, Urine: 1.027 (ref 1.005–1.030)
pH: 6 (ref 5.0–8.0)

## 2019-12-17 MED ORDER — ONDANSETRON 4 MG PO TBDP: 4.0000 mg | ORAL_TABLET | Freq: Four times a day (QID) | ORAL | 0 refills | Status: DC | PRN

## 2019-12-17 MED ORDER — DOXYLAMINE-PYRIDOXINE 10-10 MG PO TBEC: 2.0000 | DELAYED_RELEASE_TABLET | Freq: Every evening | ORAL | 5 refills | Status: DC | PRN

## 2019-12-17 MED ORDER — LACTATED RINGERS IV SOLN: INTRAVENOUS | Status: DC

## 2019-12-17 MED ORDER — SODIUM CHLORIDE 0.9 % IV SOLN
8.0000 mg | Freq: Once | INTRAVENOUS | Status: AC
  Administered 2019-12-17: 21:00:00 8 mg via INTRAVENOUS
  Filled 2019-12-17: qty 4

## 2019-12-17 NOTE — Progress Notes (Signed)
WRitten and verbal d/c instructions given and understanding voiced.  

## 2019-12-17 NOTE — Discharge Instructions (Signed)
Morning Sickness ° °Morning sickness is when a woman feels nauseous during pregnancy. This nauseous feeling may or may not come with vomiting. It often occurs in the morning, but it can be a problem at any time of day. Morning sickness is most common during the first trimester. In some cases, it may continue throughout pregnancy. Although morning sickness is unpleasant, it is usually harmless unless the woman develops severe and continual vomiting (hyperemesis gravidarum), a condition that requires more intense treatment. °What are the causes? °The exact cause of this condition is not known, but it seems to be related to normal hormonal changes that occur in pregnancy. °What increases the risk? °You are more likely to develop this condition if: °· You experienced nausea or vomiting before your pregnancy. °· You had morning sickness during a previous pregnancy. °· You are pregnant with more than one baby, such as twins. °What are the signs or symptoms? °Symptoms of this condition include: °· Nausea. °· Vomiting. °How is this diagnosed? °This condition is usually diagnosed based on your signs and symptoms. °How is this treated? °In many cases, treatment is not needed for this condition. Making some changes to what you eat may help to control symptoms. Your health care provider may also prescribe or recommend: °· Vitamin B6 supplements. °· Anti-nausea medicines. °· Ginger. °Follow these instructions at home: °Medicines °· Take over-the-counter and prescription medicines only as told by your health care provider. Do not use any prescription, over-the-counter, or herbal medicines for morning sickness without first talking with your health care provider. °· Taking multivitamins before getting pregnant can prevent or decrease the severity of morning sickness in most women. °Eating and drinking °· Eat a piece of dry toast or crackers before getting out of bed in the morning. °· Eat 5 or 6 small meals a day. °· Eat dry and  bland foods, such as rice or a baked potato. Foods that are high in carbohydrates are often helpful. °· Avoid greasy, fatty, and spicy foods. °· Have someone cook for you if the smell of any food causes nausea and vomiting. °· If you feel nauseous after taking prenatal vitamins, take the vitamins at night or with a snack. °· Snack on protein foods between meals if you are hungry. Nuts, yogurt, and cheese are good options. °· Drink fluids throughout the day. °· Try ginger ale made with real ginger, ginger tea made from fresh grated ginger, or ginger candies. °General instructions °· Do not use any products that contain nicotine or tobacco, such as cigarettes and e-cigarettes. If you need help quitting, ask your health care provider. °· Get an air purifier to keep the air in your house free of odors. °· Get plenty of fresh air. °· Try to avoid odors that trigger your nausea. °· Consider trying these methods to help relieve symptoms: °? Wearing an acupressure wristband. These wristbands are often worn for seasickness. °? Acupuncture. °Contact a health care provider if: °· Your home remedies are not working and you need medicine. °· You feel dizzy or light-headed. °· You are losing weight. °Get help right away if: °· You have persistent and uncontrolled nausea and vomiting. °· You faint. °· You have severe pain in your abdomen. °Summary °· Morning sickness is when a woman feels nauseous during pregnancy. This nauseous feeling may or may not come with vomiting. °· Morning sickness is most common during the first trimester. °· It often occurs in the morning, but it can be a problem at   any time of day. °· In many cases, treatment is not needed for this condition. Making some changes to what you eat may help to control symptoms. °This information is not intended to replace advice given to you by your health care provider. Make sure you discuss any questions you have with your health care provider. °Document Released:  02/07/2007 Document Revised: 11/29/2017 Document Reviewed: 01/19/2017 °Elsevier Patient Education © 2020 Elsevier Inc. ° °

## 2019-12-17 NOTE — MAU Provider Note (Signed)
Feels better after hydration Unable to addend other note Will discharge home with Rx Diclegis   Follow up at [facility] for prenatal care  Seabron Spates, CNM

## 2019-12-17 NOTE — MAU Note (Signed)
.   Michelle Thompson is a 23 y.o. at [redacted]w[redacted]d here in MAU reporting: that she is having N/V and cant keep anything. Pt states she was here a week ago and is being treated for a UTI but is not able to keep the medications down.   Onset of complaint: ongoing Pain score: 0 Vitals:   12/17/19 1805  BP: 140/82  Pulse: 76  Resp: 16  Temp: 98.5 F (36.9 C)  SpO2: 100%     FHT: Lab orders placed from triage: UA

## 2019-12-17 NOTE — MAU Provider Note (Addendum)
Patient Michelle Thompson is a 23 y.o. G2P1001 At [redacted]w[redacted]d here with complaints of vomiting that started on December 9.  She denies abnormal discharge, vaginal bleeding, SOB. She was recently seen in MAU and treated for a UTI, but she has not been able to keep that medicine down. She was only able to keep down two days worth of medicine.   Her vaginal bleeding stopped on December 10th.    History     CSN: [id number]  Arrival date and time: 12/17/19 1711   First Provider Initiated Contact with Patient 12/17/19 1949      Chief Complaint  Patient presents with  . Abdominal Pain  . Nausea   Abdominal Pain This is a new problem. The problem has been gradually worsening. The pain is at a severity of 7/10. The quality of the pain is cramping. The abdominal pain does not radiate. Associated symptoms include vomiting. Pertinent negatives include no diarrhea or fever.  Emesis  This is a new problem. The current episode started 1 to 4 weeks ago. The problem occurs 5 to 10 times per day. Associated symptoms include abdominal pain. Pertinent negatives include no chills, coughing, diarrhea, dizziness or fever.    OB History    Gravida  2   Para  1   Term  1   Preterm      AB      Living  1     SAB      TAB      Ectopic      Multiple      Live Births  1           Past Medical History:  Diagnosis Date  . Hypertension     Past Surgical History:  Procedure Laterality Date  . NO PAST SURGERIES      History reviewed. No pertinent family history.  Social History   Tobacco Use  . Smoking status: Never Smoker  . Smokeless tobacco: Never Used  Substance Use Topics  . Alcohol use: No  . Drug use: No    Allergies:  Allergies  Allergen Reactions  . Motrin [Ibuprofen] Other (See Comments)    Unknown reaction-childhood    Medications Prior to Admission  Medication Sig Dispense Refill Last Dose  . cephALEXin (KEFLEX) 500 MG capsule Take 1 capsule (500 mg total) by  mouth 4 (four) times daily. 28 capsule 0   . Prenatal Vit-Fe Fumarate-FA (PRENATAL MULTIVITAMIN) TABS tablet Take 1 tablet by mouth daily at 12 noon.       Review of Systems  Constitutional: Negative for chills and fever.  Respiratory: Negative for cough.   Gastrointestinal: Positive for abdominal pain and vomiting. Negative for diarrhea.  Neurological: Negative for dizziness.   Physical Exam   Blood pressure 128/61, pulse 77, temperature 98.5 F (36.9 C), resp. rate 16, weight 73 kg, last menstrual period 10/31/2019, SpO2 100 %.  Physical Exam  Constitutional: She is oriented to person, place, and time. She appears well-developed.  HENT:  Head: Normocephalic.  Eyes: Pupils are equal, round, and reactive to light.  GI: Soft.  Musculoskeletal:        General: Normal range of motion.     Cervical back: Normal range of motion.  Neurological: She is alert and oriented to person, place, and time.  Skin: Skin is warm and dry.  Psychiatric: She has a normal mood and affect.    MAU Course  Procedures  MDM  -UA shows 80 of ketones, will start  IV and give LR bolus   Bedside US shows fetus with cardiac activity; HR 120 bpm  Pt informed that the ultrasound is considered a limited OB ultrasound and is not intended to be a complete ultrasound exam.  Patient also informed that the ultrasound is not being completed with the intent of assessing for fetal or placental anomalies or any pelvic abnormalities.  Explained that the purpose of today's ultrasound is to assess for viability. Patient acknowledges the purpose of the exam and the limitations of the study.      Assessment and Plan   Patient care endorsed to [place], [place] at 2008.   Charlesetta Garibaldi Kingdavid Leinbach 12/17/2019, 7:57 PM

## 2019-12-22 ENCOUNTER — Other Ambulatory Visit: Payer: Self-pay | Admitting: Obstetrics and Gynecology

## 2019-12-22 DIAGNOSIS — Z789 Other specified health status: Secondary | ICD-10-CM

## 2019-12-24 ENCOUNTER — Other Ambulatory Visit: Payer: Managed Care, Other (non HMO) | Admitting: Obstetrics and Gynecology

## 2019-12-24 ENCOUNTER — Telehealth: Payer: Self-pay | Admitting: Obstetrics and Gynecology

## 2019-12-24 ENCOUNTER — Encounter: Payer: Managed Care, Other (non HMO) | Admitting: Obstetrics and Gynecology

## 2019-12-24 ENCOUNTER — Other Ambulatory Visit: Payer: Managed Care, Other (non HMO)

## 2019-12-24 NOTE — Telephone Encounter (Signed)
That is fine 

## 2019-12-24 NOTE — Telephone Encounter (Signed)
Pt couldn't make appt today moved appt to the 29thif app time is not okay let me know I called back you were with a pt. I advise pt that if I had to move it I would call them.

## 2019-12-29 ENCOUNTER — Encounter: Payer: Managed Care, Other (non HMO) | Admitting: Obstetrics and Gynecology

## 2019-12-30 ENCOUNTER — Ambulatory Visit: Payer: Managed Care, Other (non HMO)

## 2019-12-30 ENCOUNTER — Ambulatory Visit (INDEPENDENT_AMBULATORY_CARE_PROVIDER_SITE_OTHER): Payer: Managed Care, Other (non HMO) | Admitting: Obstetrics and Gynecology

## 2019-12-30 ENCOUNTER — Other Ambulatory Visit: Payer: Self-pay

## 2019-12-30 ENCOUNTER — Encounter: Payer: Self-pay | Admitting: Obstetrics and Gynecology

## 2019-12-30 VITALS — BP 133/83 | HR 94 | Ht 62.0 in | Wt 158.7 lb

## 2019-12-30 DIAGNOSIS — O219 Vomiting of pregnancy, unspecified: Secondary | ICD-10-CM

## 2019-12-30 DIAGNOSIS — N8312 Corpus luteum cyst of left ovary: Secondary | ICD-10-CM | POA: Diagnosis not present

## 2019-12-30 DIAGNOSIS — O3481 Maternal care for other abnormalities of pelvic organs, first trimester: Secondary | ICD-10-CM

## 2019-12-30 DIAGNOSIS — Z789 Other specified health status: Secondary | ICD-10-CM

## 2019-12-30 DIAGNOSIS — Z3A09 9 weeks gestation of pregnancy: Secondary | ICD-10-CM | POA: Diagnosis not present

## 2019-12-30 NOTE — Progress Notes (Signed)
HPI:      [name] is a 23 y.o. G2P1001 who LMP was Michelle Thompson was 10/31/2019.  Subjective:   She presents today for an emergency department follow-up.  Her ultrasound at that time revealed a fetal pole without heart tones.  She had an ultrasound today showing a fetal pole with a normal fetal heartbeat.  Additionally she was treated with Diclegis for persistent nausea and vomiting she continues to take 1 Diclegis per day and has had no further issues with significant nausea and vomiting.  She is keeping food and liquids down. She is excited about being pregnant. She is taking prenatal vitamins    Hx: The following portions of the Michelle history were reviewed and updated as appropriate:             She  has a past medical history of Hypertension. She does not have a problem list on file. She  has a past surgical history that includes No past surgeries. Her family history is not on file. She  reports that she has never smoked. She has never used smokeless tobacco. She reports that she does not drink alcohol or use drugs. She has a current medication list which includes the following prescription(s): cephalexin, doxylamine-pyridoxine, ondansetron, and prenatal multivitamin. She is allergic to motrin [ibuprofen].       Review of Systems:  Review of Systems  Constitutional: Denied constitutional symptoms, night sweats, recent illness, fatigue, fever, insomnia and weight loss.  Eyes: Denied eye symptoms, eye pain, photophobia, vision change and visual disturbance.  Ears/Nose/Throat/Neck: Denied ear, nose, throat or neck symptoms, hearing loss, nasal discharge, sinus congestion and sore throat.  Cardiovascular: Denied cardiovascular symptoms, arrhythmia, chest pain/pressure, edema, exercise intolerance, orthopnea and palpitations.  Respiratory: Denied pulmonary symptoms, asthma, pleuritic pain, productive sputum, cough, dyspnea and wheezing.  Gastrointestinal:  Denied, gastro-esophageal reflux, melena, nausea and vomiting.  Genitourinary: Denied genitourinary symptoms including symptomatic vaginal discharge, pelvic relaxation issues, and urinary complaints.  Musculoskeletal: Denied musculoskeletal symptoms, stiffness, swelling, muscle weakness and myalgia.  Dermatologic: Denied dermatology symptoms, rash and scar.  Neurologic: Denied neurology symptoms, dizziness, headache, neck pain and syncope.  Psychiatric: Denied psychiatric symptoms, anxiety and depression.  Endocrine: Denied endocrine symptoms including hot flashes and night sweats.   Meds:   Current Outpatient Medications on File Prior to Visit  Medication Sig Dispense Refill  . cephALEXin (KEFLEX) 500 MG capsule Take 1 capsule (500 mg total) by mouth 4 (four) times daily. 28 capsule 0  . Doxylamine-Pyridoxine (DICLEGIS) 10-10 MG TBEC Take 2 tablets by mouth at bedtime as needed. 100 tablet 5  . ondansetron (ZOFRAN ODT) 4 MG disintegrating tablet Take 1 tablet (4 mg total) by mouth every 6 (six) hours as needed for nausea. 20 tablet 0  . Prenatal Vit-Fe Fumarate-FA (PRENATAL MULTIVITAMIN) TABS tablet Take 1 tablet by mouth daily at 12 noon.     No current facility-administered medications on file prior to visit.    Objective:     Vitals:   12/30/19 1606  BP: 133/83  Pulse: 94              Ultrasound results reviewed directly with the patient  Assessment:    G2P1001 There are no problems to display for this patient.    1. Nausea/vomiting in pregnancy     Mild and easily controlled with Diclegis   Plan:            Prenatal Plan 1.  The patient was given prenatal  literature. 2.  She was continued on prenatal vitamins. 3.  A prenatal lab panel was ordered or drawn. 4.  Ultrasound discussed with the patient 5.  A nurse visit was scheduled. 6.  Genetic testing and testing for other inheritable conditions discussed in detail. She will decide in the future whether to have  these labs performed. 7.  A general overview of pregnancy testing, visit schedule, ultrasound schedule, and prenatal care was discussed. 8.  Benefits of breast-feeding discussed in detail including both maternal and infant benefits. Ready Set Baby website discussed.   Orders No orders of the defined types were placed in this encounter.   No orders of the defined types were placed in this encounter.     F/U  Return in about 4 weeks (around 01/27/2020). I spent 18 minutes involved in the care of this patient of which greater than 50% was spent discussing prenatal care and plan, nausea and vomiting history use of Diclegis, today's ultrasound, prenatal plan as detailed above.  All questions answered.  Elonda Husky, M.D. 12/30/2019 4:20 PM

## 2020-01-01 NOTE — L&D Delivery Note (Addendum)
Patient: Michelle Thompson MRN: [id number]  GBS status: Positive, IAP given  Patient is a 24 y.o. now [id number] s/p NSVD at [redacted]w[redacted]d, who was admitted for IOL due to mild preeclampsia.Marland Kitchen AROM 1h 36m prior to delivery with clear fluid.   Initial SVE upon admission was 1/50/-3. Patient received Cytotec x2 and FB placement with progression to 3.5/50/-3. Patient was started on Pitocin at 1030 and progressed to 5/70/-2. AROM performed at 2038 with clear fluid and patient ultimately progressed to 10/100/+2 at 2200.   Delivery Note After progressing to complete, patient began pushing. Head delivered ROA. No nuchal cord present. Shoulder and body delivered in usual fashion. Body cord present and reduced. Infant with spontaneous cry, placed on mother's abdomen, dried and bulb suctioned. Cord clamped x 2 after 1-minute delay, and cut by family member. Cord blood drawn. Placenta delivered spontaneously with gentle cord traction. Fundus firm with massage and Pitocin. Perineum inspected and found to be intact.  At 10:27 PM a viable female was delivered via Vaginal, Spontaneous (Presentation: Right Occiput Anterior).  APGAR: 8, 9; weight 2906 gm.   Placenta status: Spontaneous, Intact.  Cord: 3 vessels with the following complications: None.  Anesthesia: Epidural Episiotomy: None Lacerations: None Est. Blood Loss (mL): 150  Mom to postpartum.  Baby to Couplet care / Skin to Skin.  Worthy Rancher, MD Family Medicine PGY-3 07/20/2020, 10:58 PM  OB FELLOW DELIVERY ATTESTATION  I was gloved and present for the delivery in its entirety, and I agree with the above resident's note.    Jerilynn Birkenhead, MD [facility] Family Medicine Fellow, [facility] for [employer], [facility]

## 2020-01-06 NOTE — Patient Instructions (Signed)
First Trimester of Pregnancy The first trimester of pregnancy is from week 1 until the end of week 13 (months 1 through 3). A week after a sperm fertilizes an egg, the egg will implant on the wall of the uterus. This embryo will begin to develop into a baby. Genes from you and your partner will form the baby. The female genes will determine whether the baby will be a boy or a girl. At 6-8 weeks, the eyes and face will be formed, and the heartbeat can be seen on ultrasound. At the end of 12 weeks, all the baby's organs will be formed. Now that you are pregnant, you will want to do everything you can to have a healthy baby. Two of the most important things are to get good prenatal care and to follow your health care provider's instructions. Prenatal care is all the medical care you receive before the baby's birth. This care will help prevent, find, and treat any problems during the pregnancy and childbirth. Body changes during your first trimester Your body goes through many changes during pregnancy. The changes vary from woman to woman.  You may gain or lose a couple of pounds at first.  You may feel sick to your stomach (nauseous) and you may throw up (vomit). If the vomiting is uncontrollable, call your health care provider.  You may tire easily.  You may develop headaches that can be relieved by medicines. All medicines should be approved by your health care provider.  You may urinate more often. Painful urination may mean you have a bladder infection.  You may develop heartburn as a result of your pregnancy.  You may develop constipation because certain hormones are causing the muscles that push stool through your intestines to slow down.  You may develop hemorrhoids or swollen veins (varicose veins).  Your breasts may begin to grow larger and become tender. Your nipples may stick out more, and the tissue that surrounds them (areola) may become darker.  Your gums may bleed and may be  sensitive to brushing and flossing.  Dark spots or blotches (chloasma, mask of pregnancy) may develop on your face. This will likely fade after the baby is born.  Your menstrual periods will stop.  You may have a loss of appetite.  You may develop cravings for certain kinds of food.  You may have changes in your emotions from day to day, such as being excited to be pregnant or being concerned that something may go wrong with the pregnancy and baby.  You may have more vivid and strange dreams.  You may have changes in your hair. These can include thickening of your hair, rapid growth, and changes in texture. Some women also have hair loss during or after pregnancy, or hair that feels dry or thin. Your hair will most likely return to normal after your baby is born. What to expect at prenatal visits During a routine prenatal visit:  You will be weighed to make sure you and the baby are growing normally.  Your blood pressure will be taken.  Your abdomen will be measured to track your baby's growth.  The fetal heartbeat will be listened to between weeks 10 and 14 of your pregnancy.  Test results from any previous visits will be discussed. Your health care provider may ask you:  How you are feeling.  If you are feeling the baby move.  If you have had any abnormal symptoms, such as leaking fluid, bleeding, severe headaches, or abdominal   cramping.  If you are using any tobacco products, including cigarettes, chewing tobacco, and electronic cigarettes.  If you have any questions. Other tests that may be performed during your first trimester include:  Blood tests to find your blood type and to check for the presence of any previous infections. The tests will also be used to check for low iron levels (anemia) and protein on red blood cells (Rh antibodies). Depending on your risk factors, or if you previously had diabetes during pregnancy, you may have tests to check for high blood sugar  that affects pregnant women (gestational diabetes).  Urine tests to check for infections, diabetes, or protein in the urine.  An ultrasound to confirm the proper growth and development of the baby.  Fetal screens for spinal cord problems (spina bifida) and Down syndrome.  HIV (human immunodeficiency virus) testing. Routine prenatal testing includes screening for HIV, unless you choose not to have this test.  You may need other tests to make sure you and the baby are doing well. Follow these instructions at home: Medicines  Follow your health care provider's instructions regarding medicine use. Specific medicines may be either safe or unsafe to take during pregnancy.  Take a prenatal vitamin that contains at least 600 micrograms (mcg) of folic acid.  If you develop constipation, try taking a stool softener if your health care provider approves. Eating and drinking   Eat a balanced diet that includes fresh fruits and vegetables, whole grains, good sources of protein such as meat, eggs, or tofu, and low-fat dairy. Your health care provider will help you determine the amount of weight gain that is right for you.  Avoid raw meat and uncooked cheese. These carry germs that can cause birth defects in the baby.  Eating four or five small meals rather than three large meals a day may help relieve nausea and vomiting. If you start to feel nauseous, eating a few soda crackers can be helpful. Drinking liquids between meals, instead of during meals, also seems to help ease nausea and vomiting.  Limit foods that are high in fat and processed sugars, such as fried and sweet foods.  To prevent constipation: ? Eat foods that are high in fiber, such as fresh fruits and vegetables, whole grains, and beans. ? Drink enough fluid to keep your urine clear or pale yellow. Activity  Exercise only as directed by your health care provider. Most women can continue their usual exercise routine during  pregnancy. Try to exercise for 30 minutes at least 5 days a week. Exercising will help you: ? Control your weight. ? Stay in shape. ? Be prepared for labor and delivery.  Experiencing pain or cramping in the lower abdomen or lower back is a good sign that you should stop exercising. Check with your health care provider before continuing with normal exercises.  Try to avoid standing for long periods of time. Move your legs often if you must stand in one place for a long time.  Avoid heavy lifting.  Wear low-heeled shoes and practice good posture.  You may continue to have sex unless your health care provider tells you not to. Relieving pain and discomfort  Wear a good support bra to relieve breast tenderness.  Take warm sitz baths to soothe any pain or discomfort caused by hemorrhoids. Use hemorrhoid cream if your health care provider approves.  Rest with your legs elevated if you have leg cramps or low back pain.  If you develop varicose veins in   your legs, wear support hose. Elevate your feet for 15 minutes, 3-4 times a day. Limit salt in your diet. Prenatal care  Schedule your prenatal visits by the twelfth week of pregnancy. They are usually scheduled monthly at first, then more often in the last 2 months before delivery.  Write down your questions. Take them to your prenatal visits.  Keep all your prenatal visits as told by your health care provider. This is important. Safety  Wear your seat belt at all times when driving.  Make a list of emergency phone numbers, including numbers for family, friends, the hospital, and police and fire departments. General instructions  Ask your health care provider for a referral to a local prenatal education class. Begin classes no later than the beginning of month 6 of your pregnancy.  Ask for help if you have counseling or nutritional needs during pregnancy. Your health care provider can offer advice or refer you to specialists for help  with various needs.  Do not use hot tubs, steam rooms, or saunas.  Do not douche or use tampons or scented sanitary pads.  Do not cross your legs for long periods of time.  Avoid cat litter boxes and soil used by cats. These carry germs that can cause birth defects in the baby and possibly loss of the fetus by miscarriage or stillbirth.  Avoid all smoking, herbs, alcohol, and medicines not prescribed by your health care provider. Chemicals in these products affect the formation and growth of the baby.  Do not use any products that contain nicotine or tobacco, such as cigarettes and e-cigarettes. If you need help quitting, ask your health care provider. You may receive counseling support and other resources to help you quit.  Schedule a dentist appointment. At home, brush your teeth with a soft toothbrush and be gentle when you floss. Contact a health care provider if:  You have dizziness.  You have mild pelvic cramps, pelvic pressure, or nagging pain in the abdominal area.  You have persistent nausea, vomiting, or diarrhea.  You have a bad smelling vaginal discharge.  You have pain when you urinate.  You notice increased swelling in your face, hands, legs, or ankles.  You are exposed to fifth disease or chickenpox.  You are exposed to German measles (rubella) and have never had it. Get help right away if:  You have a fever.  You are leaking fluid from your vagina.  You have spotting or bleeding from your vagina.  You have severe abdominal cramping or pain.  You have rapid weight gain or loss.  You vomit blood or material that looks like coffee grounds.  You develop a severe headache.  You have shortness of breath.  You have any kind of trauma, such as from a fall or a car accident. Summary  The first trimester of pregnancy is from week 1 until the end of week 13 (months 1 through 3).  Your body goes through many changes during pregnancy. The changes vary from  woman to woman.  You will have routine prenatal visits. During those visits, your health care provider will examine you, discuss any test results you may have, and talk with you about how you are feeling. This information is not intended to replace advice given to you by your health care provider. Make sure you discuss any questions you have with your health care provider. Document Revised: 11/29/2017 Document Reviewed: 11/28/2016 Elsevier Patient Education  2020 [employer]  Commonly Asked Questions During Pregnancy    Cats: A parasite can be excreted in cat feces.  To avoid exposure you need to have another person empty the little box.  If you must empty the litter box you will need to wear gloves.  Wash your hands after handling your cat.  This parasite can also be found in raw or undercooked meat so this should also be avoided.  Colds, Sore Throats, Flu: Please check your medication sheet to see what you can take for symptoms.  If your symptoms are unrelieved by these medications please call the office.  Dental Work: Most any dental work your dentist recommends is permitted.  X-rays should only be taken during the first trimester if absolutely necessary.  Your abdomen should be shielded with a lead apron during all x-rays.  Please notify your provider prior to receiving any x-rays.  Novocaine is fine; gas is not recommended.  If your dentist requires a note from us prior to dental work please call the office and we will provide one for you.  Exercise: Exercise is an important part of staying healthy during your pregnancy.  You may continue most exercises you were accustomed to prior to pregnancy.  Later in your pregnancy you will most likely notice you have difficulty with activities requiring balance like riding a bicycle.  It is important that you listen to your body and avoid activities that put you at a higher risk of falling.  Adequate rest and staying well hydrated are a must!  If you have  questions about the safety of specific activities ask your provider.    Exposure to Children with illness: Try to avoid obvious exposure; report any symptoms to us when noted,  If you have chicken pos, red measles or mumps, you should be immune to these diseases.   Please do not take any vaccines while pregnant unless you have checked with your OB provider.  Fetal Movement: After 28 weeks we recommend you do "kick counts" twice daily.  Lie or sit down in a calm quiet environment and count your baby movements "kicks".  You should feel your baby at least 10 times per hour.  If you have not felt 10 kicks within the first hour get up, walk around and have something sweet to eat or drink then repeat for an additional hour.  If count remains less than 10 per hour notify your provider.  Fumigating: Follow your pest control agent's advice as to how long to stay out of your home.  Ventilate the area well before re-entering.  Hemorrhoids:   Most over-the-counter preparations can be used during pregnancy.  Check your medication to see what is safe to use.  It is important to use a stool softener or fiber in your diet and to drink lots of liquids.  If hemorrhoids seem to be getting worse please call the office.   Hot Tubs:  Hot tubs Jacuzzis and saunas are not recommended while pregnant.  These increase your internal body temperature and should be avoided.  Intercourse:  Sexual intercourse is safe during pregnancy as long as you are comfortable, unless otherwise advised by your provider.  Spotting may occur after intercourse; report any bright red bleeding that is heavier than spotting.  Labor:  If you know that you are in labor, please go to the hospital.  If you are unsure, please call the office and let us help you decide what to do.  Lifting, straining, etc:  If your job requires heavy lifting or straining please check with   your provider for any limitations.  Generally, you should not lift items heavier than  that you can lift simply with your hands and arms (no back muscles)  Painting:  Paint fumes do not harm your pregnancy, but may make you ill and should be avoided if possible.  Latex or water based paints have less odor than oils.  Use adequate ventilation while painting.  Permanents & Hair Color:  Chemicals in hair dyes are not recommended as they cause increase hair dryness which can increase hair loss during pregnancy.  " Highlighting" and permanents are allowed.  Dye may be absorbed differently and permanents may not hold as well during pregnancy.  Sunbathing:  Use a sunscreen, as skin burns easily during pregnancy.  Drink plenty of fluids; avoid over heating.  Tanning Beds:  Because their possible side effects are still unknown, tanning beds are not recommended.  Ultrasound Scans:  Routine ultrasounds are performed at approximately 20 weeks.  You will be able to see your baby's general anatomy an if you would like to know the gender this can usually be determined as well.  If it is questionable when you conceived you may also receive an ultrasound early in your pregnancy for dating purposes.  Otherwise ultrasound exams are not routinely performed unless there is a medical necessity.  Although you can request a scan we ask that you pay for it when conducted because insurance does not cover " patient request" scans.  Work: If your pregnancy proceeds without complications you may work until your due date, unless your physician or employer advises otherwise.  Round Ligament Pain/Pelvic Discomfort:  Sharp, shooting pains not associated with bleeding are fairly common, usually occurring in the second trimester of pregnancy.  They tend to be worse when standing up or when you remain standing for long periods of time.  These are the result of pressure of certain pelvic ligaments called "round ligaments".  Rest, Tylenol and heat seem to be the most effective relief.  As the womb and fetus grow, they rise  out of the pelvis and the discomfort improves.  Please notify the office if your pain seems different than that described.  It may represent a more serious condition.   How a Baby Grows During Pregnancy  Pregnancy begins when a female's sperm enters a female's egg (fertilization). Fertilization usually happens in one of the tubes (fallopian tubes) that connect the ovaries to the womb (uterus). The fertilized egg moves down the fallopian tube to the uterus. Once it reaches the uterus, it implants into the lining of the uterus and begins to grow. For the first 10 weeks, the fertilized egg is called an embryo. After 10 weeks, it is called a fetus. As the fetus continues to grow, it receives oxygen and nutrients through tissue (placenta) that grows to support the developing baby. The placenta is the life support system for the baby. It provides oxygen and nutrition and removes waste. Learning as much as you can about your pregnancy and how your baby is developing can help you enjoy the experience. It can also make you aware of when there might be a problem and when to ask questions. How long does a typical pregnancy last? A pregnancy usually lasts 280 days, or about 40 weeks. Pregnancy is divided into three periods of growth, also called trimesters:  First trimester: 0-12 weeks.  Second trimester: 13-27 weeks.  Third trimester: 28-40 weeks. The day when your baby is ready to be born (full term) is   your estimated date of delivery. How does my baby develop month by month? First month  The fertilized egg attaches to the inside of the uterus.  Some cells will form the placenta. Others will form the fetus.  The arms, legs, brain, spinal cord, lungs, and heart begin to develop.  At the end of the first month, the heart begins to beat. Second month  The bones, inner ear, eyelids, hands, and feet form.  The genitals develop.  By the end of 8 weeks, all major organs are developing. Third  month  All of the internal organs are forming.  Teeth develop below the gums.  Bones and muscles begin to grow. The spine can flex.  The skin is transparent.  Fingernails and toenails begin to form.  Arms and legs continue to grow longer, and hands and feet develop.  The fetus is about 3 inches (7.6 cm) long. Fourth month  The placenta is completely formed.  The external sex organs, neck, outer ear, eyebrows, eyelids, and fingernails are formed.  The fetus can hear, swallow, and move its arms and legs.  The kidneys begin to produce urine.  The skin is covered with a white, waxy coating (vernix) and very fine hair (lanugo). Fifth month  The fetus moves around more and can be felt for the first time (quickening).  The fetus starts to sleep and wake up and may begin to suck its finger.  The nails grow to the end of the fingers.  The organ in the digestive system that makes bile (gallbladder) functions and helps to digest nutrients.  If your baby is a girl, eggs are present in her ovaries. If your baby is a boy, testicles start to move down into his scrotum. Sixth month  The lungs are formed.  The eyes open. The brain continues to develop.  Your baby has fingerprints and toe prints. Your baby's hair grows thicker.  At the end of the second trimester, the fetus is about 9 inches (22.9 cm) long. Seventh month  The fetus kicks and stretches.  The eyes are developed enough to sense changes in light.  The hands can make a grasping motion.  The fetus responds to sound. Eighth month  All organs and body systems are fully developed and functioning.  Bones harden, and taste buds develop. The fetus may hiccup.  Certain areas of the brain are still developing. The skull remains soft. Ninth month  The fetus gains about  lb (0.23 kg) each week.  The lungs are fully developed.  Patterns of sleep develop.  The fetus's head typically moves into a head-down position  (vertex) in the uterus to prepare for birth.  The fetus weighs 6-9 lb (2.72-4.08 kg) and is 19-20 inches (48.26-50.8 cm) long. What can I do to have a healthy pregnancy and help my baby develop? General instructions  Take prenatal vitamins as directed by your health care provider. These include vitamins such as folic acid, iron, calcium, and vitamin D. They are important for healthy development.  Take medicines only as directed by your health care provider. Read labels and ask a pharmacist or your health care provider whether over-the-counter medicines, supplements, and prescription drugs are safe to take during pregnancy.  Keep all follow-up visits as directed by your health care provider. This is important. Follow-up visits include prenatal care and screening tests. How do I know if my baby is developing well? At each prenatal visit, your health care provider will do several different tests to   check on your health and keep track of your baby's development. These include:  Fundal height and position. ? Your health care provider will measure your growing belly from your pubic bone to the top of the uterus using a tape measure. ? Your health care provider will also feel your belly to determine your baby's position.  Heartbeat. ? An ultrasound in the first trimester can confirm pregnancy and show a heartbeat, depending on how far along you are. ? Your health care provider will check your baby's heart rate at every prenatal visit.  Second trimester ultrasound. ? This ultrasound checks your baby's development. It also may show your baby's gender. What should I do if I have concerns about my baby's development? Always talk with your health care provider about any concerns that you may have about your pregnancy and your baby. Summary  A pregnancy usually lasts 280 days, or about 40 weeks. Pregnancy is divided into three periods of growth, also called trimesters.  Your health care provider  will monitor your baby's growth and development throughout your pregnancy.  Follow your health care provider's recommendations about taking prenatal vitamins and medicines during your pregnancy.  Talk with your health care provider if you have any concerns about your pregnancy or your developing baby. This information is not intended to replace advice given to you by your health care provider. Make sure you discuss any questions you have with your health care provider. Document Revised: 04/09/2019 Document Reviewed: 10/30/2017 Elsevier Patient Education  2020 [employer]  

## 2020-01-11 ENCOUNTER — Other Ambulatory Visit: Payer: Self-pay

## 2020-01-11 ENCOUNTER — Ambulatory Visit (INDEPENDENT_AMBULATORY_CARE_PROVIDER_SITE_OTHER): Payer: Managed Care, Other (non HMO) | Admitting: Obstetrics and Gynecology

## 2020-01-11 VITALS — BP 123/74 | HR 98 | Ht 62.0 in | Wt 160.6 lb

## 2020-01-11 DIAGNOSIS — Z0283 Encounter for blood-alcohol and blood-drug test: Secondary | ICD-10-CM

## 2020-01-11 DIAGNOSIS — Z3491 Encounter for supervision of normal pregnancy, unspecified, first trimester: Secondary | ICD-10-CM

## 2020-01-11 DIAGNOSIS — Z113 Encounter for screening for infections with a predominantly sexual mode of transmission: Secondary | ICD-10-CM

## 2020-01-11 NOTE — Progress Notes (Signed)
      Michelle Thompson presents for NOB nurse intake visit. Pregnancy confirmation done at [facility],12/30/2019 , with Linzie Collin  G2.  P1001.  LMP 10/31/19.  EDD 08/02/20 .  [place] [redacted]w[redacted]d. Pregnancy education material explained and given. 0 cats in the home.  NOB labs ordered. BMI less than 30. TSH/HbgA1c not ordered. Sickle cell order due to race. HIV and drug screen explained and ordered. Genetic screening discussed. Genetic testing; Ordered and completed. Pt to discuss genetic testing with provider. PNV encouraged. Pt to follow up with provider in 2 weeks for NOB physical.  [facility] Financial Policy reviewed with pt. FMLA form signed and reviewed by pt.    BP 123/74   Pulse 98   Ht 5\' 2"  (1.575 m)   Wt 160 lb 9.6 oz (72.8 kg)   LMP 10/31/2019   BMI 29.37 kg/m

## 2020-01-12 LAB — DRUG PROFILE, UR, 9 DRUGS (LABCORP)
Amphetamines, Urine: NEGATIVE ng/mL
Barbiturate Quant, Ur: NEGATIVE ng/mL
Benzodiazepine Quant, Ur: NEGATIVE ng/mL
Cannabinoid Quant, Ur: NEGATIVE ng/mL
Cocaine (Metab.): NEGATIVE ng/mL
Methadone Screen, Urine: NEGATIVE ng/mL
Opiate Quant, Ur: NEGATIVE ng/mL
PCP Quant, Ur: NEGATIVE ng/mL
Propoxyphene: NEGATIVE ng/mL

## 2020-01-12 LAB — URINALYSIS, ROUTINE W REFLEX MICROSCOPIC
Bilirubin, UA: NEGATIVE
Glucose, UA: NEGATIVE
Leukocytes,UA: NEGATIVE
Nitrite, UA: NEGATIVE
RBC, UA: NEGATIVE
Specific Gravity, UA: 1.022 (ref 1.005–1.030)
Urobilinogen, Ur: 1 mg/dL (ref 0.2–1.0)
pH, UA: 6 (ref 5.0–7.5)

## 2020-01-12 LAB — RPR: RPR Ser Ql: NONREACTIVE

## 2020-01-12 LAB — HGB SOLU + RFLX FRAC: Sickle Solubility Test - HGBRFX: NEGATIVE

## 2020-01-12 LAB — VARICELLA ZOSTER ANTIBODY, IGG: Varicella zoster IgG: 1065 index (ref >165)

## 2020-01-12 LAB — RUBELLA SCREEN: Rubella Antibodies, IGG: 15.4 index (ref >0.99)

## 2020-01-12 LAB — HEPATITIS B SURFACE ANTIGEN: Hepatitis B Surface Ag: NEGATIVE

## 2020-01-12 LAB — ABO AND RH: Rh Factor: POSITIVE

## 2020-01-12 LAB — HIV ANTIBODY (ROUTINE TESTING W REFLEX): HIV Screen 4th Generation wRfx: NONREACTIVE

## 2020-01-12 LAB — NICOTINE SCREEN, URINE: Cotinine Ql Scrn, Ur: NEGATIVE ng/mL

## 2020-01-12 LAB — ANTIBODY SCREEN: Antibody Screen: NEGATIVE

## 2020-01-13 LAB — GC/CHLAMYDIA PROBE AMP
Chlamydia trachomatis, NAA: NEGATIVE
Neisseria Gonorrhoeae by PCR: NEGATIVE

## 2020-01-13 LAB — CULTURE, OB URINE

## 2020-01-13 LAB — URINE CULTURE, OB REFLEX

## 2020-01-17 LAB — MATERNIT 21 PLUS CORE, BLOOD
Fetal Fraction: 8
Result (T21): NEGATIVE
Trisomy 13 (Patau syndrome): NEGATIVE
Trisomy 18 (Edwards syndrome): NEGATIVE
Trisomy 21 (Down syndrome): NEGATIVE

## 2020-01-18 ENCOUNTER — Encounter (HOSPITAL_COMMUNITY): Payer: Self-pay | Admitting: Obstetrics and Gynecology

## 2020-01-18 ENCOUNTER — Other Ambulatory Visit: Payer: Self-pay

## 2020-01-18 ENCOUNTER — Inpatient Hospital Stay (HOSPITAL_COMMUNITY)
Admission: AD | Admit: 2020-01-18 | Discharge: 2020-01-18 | Disposition: A | Discharge status: Home / Self Care | Payer: Managed Care, Other (non HMO) | Attending: Obstetrics and Gynecology | Admitting: Obstetrics and Gynecology

## 2020-01-18 DIAGNOSIS — O219 Vomiting of pregnancy, unspecified: Secondary | ICD-10-CM | POA: Diagnosis not present

## 2020-01-18 DIAGNOSIS — Z833 Family history of diabetes mellitus: Secondary | ICD-10-CM | POA: Diagnosis not present

## 2020-01-18 DIAGNOSIS — Z3A11 11 weeks gestation of pregnancy: Secondary | ICD-10-CM | POA: Diagnosis not present

## 2020-01-18 DIAGNOSIS — Z20822 Contact with and (suspected) exposure to covid-19: Secondary | ICD-10-CM | POA: Insufficient documentation

## 2020-01-18 DIAGNOSIS — Z209 Contact with and (suspected) exposure to unspecified communicable disease: Secondary | ICD-10-CM

## 2020-01-18 DIAGNOSIS — Z886 Allergy status to analgesic agent status: Secondary | ICD-10-CM | POA: Insufficient documentation

## 2020-01-18 DIAGNOSIS — Z823 Family history of stroke: Secondary | ICD-10-CM | POA: Diagnosis not present

## 2020-01-18 DIAGNOSIS — O161 Unspecified maternal hypertension, first trimester: Secondary | ICD-10-CM | POA: Diagnosis not present

## 2020-01-18 LAB — URINALYSIS, ROUTINE W REFLEX MICROSCOPIC
Bacteria, UA: NONE SEEN
Bilirubin Urine: NEGATIVE
Glucose, UA: NEGATIVE mg/dL
Hgb urine dipstick: NEGATIVE
Ketones, ur: 80 mg/dL — AB
Leukocytes,Ua: NEGATIVE
Nitrite: NEGATIVE
Protein, ur: 100 mg/dL — AB
Specific Gravity, Urine: 1.028 (ref 1.005–1.030)
pH: 6 (ref 5.0–8.0)

## 2020-01-18 MED ORDER — PROMETHAZINE HCL 25 MG/ML IJ SOLN
25.0000 mg | Freq: Once | INTRAVENOUS | Status: AC
  Administered 2020-01-18: 25 mg via INTRAVENOUS
  Filled 2020-01-18: qty 1

## 2020-01-18 MED ORDER — PROMETHAZINE HCL 25 MG PO TABS: 25.0000 mg | ORAL_TABLET | Freq: Four times a day (QID) | ORAL | 0 refills | Status: DC | PRN

## 2020-01-18 MED ORDER — FAMOTIDINE IN NACL 20-0.9 MG/50ML-% IV SOLN
20.0000 mg | Freq: Once | INTRAVENOUS | Status: AC
  Administered 2020-01-18: 20 mg via INTRAVENOUS
  Filled 2020-01-18: qty 50

## 2020-01-18 MED ORDER — ONDANSETRON HCL 4 MG/2ML IJ SOLN
4.0000 mg | Freq: Once | INTRAMUSCULAR | Status: AC
  Administered 2020-01-18: 4 mg via INTRAVENOUS
  Filled 2020-01-18: qty 2

## 2020-01-18 MED ORDER — M.V.I. ADULT IV INJ
Freq: Once | INTRAVENOUS | Status: AC
  Filled 2020-01-18: qty 1000

## 2020-01-18 NOTE — MAU Provider Note (Addendum)
History     CSN: [id number]  Arrival date and time: 01/18/20 1647   First Provider Initiated Contact with Patient 01/18/20 1805      Chief Complaint  Patient presents with  . Nausea  . Emesis  . Dizziness   HPI  Ms.  Michelle Thompson is a 24 y.o. year old [id number] female at [redacted]w[redacted]d weeks gestation who presents to MAU reporting vomiting since 0800 this morning, chills and "feeling like going to pass out." She reports she started seeing blood in her vomit today. She tried take Zofran for the N/V, but "vomited that up." She denies abdominal pain, VB or abnormal vaginal d/c. She states all of her sx's started today.   *Patient told RN after CNM assessment that she just found out about 2 classmates that tested (+) for COVID-19. She is concerned about her exposure and is requesting to be tested for COVID-19 while she is here tonight.  Past Medical History:  Diagnosis Date  . Hypertension     Past Surgical History:  Procedure Laterality Date  . NO PAST SURGERIES      Family History  Problem Relation Age of Onset  . Lupus Mother   . Healthy Father   . Asthma Brother   . Diabetes Maternal Grandmother   . COPD Maternal Grandmother   . Diabetes Maternal Grandfather   . Stroke Maternal Grandfather     Social History   Tobacco Use  . Smoking status: Never Smoker  . Smokeless tobacco: Never Used  Substance Use Topics  . Alcohol use: No  . Drug use: No    Allergies:  Allergies  Allergen Reactions  . Motrin [Ibuprofen] Other (See Comments)    Unknown reaction-childhood    Medications Prior to Admission  Medication Sig Dispense Refill Last Dose  . ondansetron (ZOFRAN ODT) 4 MG disintegrating tablet Take 1 tablet (4 mg total) by mouth every 6 (six) hours as needed for nausea. 20 tablet 0   . Prenatal Vit-Fe Fumarate-FA (PRENATAL MULTIVITAMIN) TABS tablet Take 1 tablet by mouth daily at 12 noon.       Review of Systems  Constitutional: Negative.   HENT: Negative.   Eyes:  Negative.   Respiratory: Negative.   Cardiovascular: Negative.   Gastrointestinal: Positive for nausea and vomiting.  Endocrine: Negative.   Genitourinary: Negative.   Musculoskeletal: Negative.   Skin: Negative.   Allergic/Immunologic: Negative.   Neurological: Negative.   Hematological: Negative.   Psychiatric/Behavioral: Negative.    Physical Exam   Blood pressure 126/76, pulse (!) 103, temperature 99.5 F (37.5 C), temperature source Oral, resp. rate 16, height 5\' 2"  (1.575 m), weight 70.4 kg, last menstrual period 10/31/2019, SpO2 100 %.  Physical Exam  Nursing note and vitals reviewed. Constitutional: She is oriented to person, place, and time. She appears well-developed and well-nourished.  HENT:  Head: Normocephalic and atraumatic.  Eyes: Pupils are equal, round, and reactive to light.  Cardiovascular: Normal rate.  Respiratory: Effort normal.  GI: Soft.  Genitourinary:    Genitourinary Comments: Not indicated   Musculoskeletal:        General: Normal range of motion.     Cervical back: Normal range of motion.  Neurological: She is alert and oriented to person, place, and time.  Skin: Skin is warm and dry.  Psychiatric: She has a normal mood and affect. Her behavior is normal. Judgment and thought content normal.    MAU Course  Procedures  MDM CCUA IVFs: Phenergan 25 mg in LR  1000 ml @ 999 ml/hr; followed by MVI in LR 1000 ml @ 500 ml/hr --  nausea/vomiting COVID-19 test -- Results pending   Results for orders placed or performed during the hospital encounter of 01/18/20 (from the past 24 hour(s))  Urinalysis, Routine w reflex microscopic     Status: Abnormal   Collection Time: 01/18/20  7:17 PM  Result Value Ref Range   Color, Urine YELLOW YELLOW   APPearance HAZY (A) CLEAR   Specific Gravity, Urine 1.028 1.005 - 1.030   pH 6.0 5.0 - 8.0   Glucose, UA NEGATIVE NEGATIVE mg/dL   Hgb urine dipstick NEGATIVE NEGATIVE   Bilirubin Urine NEGATIVE NEGATIVE    Ketones, ur 80 (A) NEGATIVE mg/dL   Protein, ur 100 (A) NEGATIVE mg/dL   Nitrite NEGATIVE NEGATIVE   Leukocytes,Ua NEGATIVE NEGATIVE   RBC / HPF 0-5 0 - 5 RBC/hpf   WBC, UA 0-5 0 - 5 WBC/hpf   Bacteria, UA NONE SEEN NONE SEEN   Squamous Epithelial / LPF 0-5 0 - 5   Mucus PRESENT      Report given to and care assumed by Darrol Poke, CNM @ 2100  Laury Deep, MSN, CNM 01/18/2020, 9:12 PM   Upon reassessment patient reports continued nauseous feeling, has not vomited since initial treatment - Zofran and pepcid IV ordered  Continuation of MVI   Once halfway with MVI, PO challenge initiated  PO Challenge -- patient tolerated well  COVID19 precautions discussed, reviewed with patient that results will be back by tomorrow. Encouraged to self quarantine and take necessary precautions to prevent spread, patient verbalizes understanding   Rx for Phenergan sent to pharmacy of choice. Discussed reasons to return to MAU. Follow up as scheduled in the office. Return to MAU as needed. Pt stable at time of discharge.   Assessment and Plan   1. Nausea and vomiting during pregnancy   2. Contact with or exposure to communicable disease    Discharge home Follow up as scheduled at [facility] for prenatal care Return to MAU as needed for reasons discussed and/or emergencies  Rx for phenergan  COVID19 precautions and self quarantine   Follow-up Information    [facility] Follow up.   Specialty: Obstetrics and Gynecology Why: Return to MAU as needed for reasons discussed  Contact information: [address] [id number] [place] [phone]       [facility] Follow up.   Why: Follow up as scheduled for initial prenatal visit  Contact information: [place].  [place] [phone]         Allergies as of 01/18/2020      Reactions   Motrin [ibuprofen] Other (See Comments)    Unknown reaction-childhood      Medication List    TAKE these medications   ondansetron 4 MG disintegrating tablet Commonly known as: Zofran ODT Take 1 tablet (4 mg total) by mouth every 6 (six) hours as needed for nausea.   prenatal multivitamin Tabs tablet Take 1 tablet by mouth daily at 12 noon.   promethazine 25 MG tablet Commonly known as: PHENERGAN Take 1 tablet (25 mg total) by mouth every 6 (six) hours as needed for nausea or vomiting.      Lajean Manes, CNM 01/18/20, 11:09 PM

## 2020-01-18 NOTE — MAU Note (Signed)
Michelle Thompson is a 24 y.o. at [redacted]w[redacted]d here in MAU reporting: has been vomiting since 0800. Has chills and feels like she is going to pass out. Seeing blood in her vomit. Tried zofran but vomitted it back up. No abdominal pain, bleeding, or discharge.  Onset of complaint: today  Pain score: 0/10  Vitals:   01/18/20 1723  BP: 126/76  Pulse: (!) 103  Resp: 16  Temp: 98.5 F (36.9 C)  SpO2: 100%      Lab orders placed from triage: UA

## 2020-01-18 NOTE — MAU Note (Signed)
Pt is a G2P1 at 11.6 weeks c/o N&V.  She states that today it has gotten worse with blood in the vomit, chills, dizziness and fatigue.

## 2020-01-19 LAB — SARS CORONAVIRUS 2 (TAT 6-24 HRS): SARS Coronavirus 2: NEGATIVE

## 2020-01-27 ENCOUNTER — Encounter: Payer: Managed Care, Other (non HMO) | Admitting: Obstetrics and Gynecology

## 2020-02-05 ENCOUNTER — Encounter: Payer: Managed Care, Other (non HMO) | Admitting: Obstetrics and Gynecology

## 2020-02-11 ENCOUNTER — Encounter: Payer: Self-pay | Admitting: Obstetrics and Gynecology

## 2020-02-11 ENCOUNTER — Other Ambulatory Visit: Payer: Self-pay

## 2020-02-11 ENCOUNTER — Ambulatory Visit (INDEPENDENT_AMBULATORY_CARE_PROVIDER_SITE_OTHER): Payer: Managed Care, Other (non HMO) | Admitting: Obstetrics and Gynecology

## 2020-02-11 VITALS — BP 141/83 | HR 108 | Wt 170.0 lb

## 2020-02-11 DIAGNOSIS — Z23 Encounter for immunization: Secondary | ICD-10-CM | POA: Diagnosis not present

## 2020-02-11 DIAGNOSIS — Z3492 Encounter for supervision of normal pregnancy, unspecified, second trimester: Secondary | ICD-10-CM

## 2020-02-11 NOTE — Progress Notes (Signed)
NOB: Breast tenderness and nausea and vomiting slowly resolving.  Patient desires AFP today.  FAS next visit.  Patient missed her last appointment and rescheduled so she is somewhat triangular in her new OB visit.  We have discussed this. Physical examination General NAD, Conversant  HEENT Atraumatic; Op clear with mmm.  Normo-cephalic. Pupils reactive. Anicteric sclerae  Thyroid/Neck Smooth without nodularity or enlargement. Normal ROM.  Neck Supple.  Skin No rashes, lesions or ulceration. Normal palpated skin turgor. No nodularity.  Breasts: No masses or discharge.  Symmetric.  No axillary adenopathy.  Lungs: Clear to auscultation.No rales or wheezes. Normal Respiratory effort, no retractions.  Heart: NSR.  No murmurs or rubs appreciated. No periferal edema  Abdomen: Soft.  Non-tender.  No masses.  No HSM. No hernia  Extremities: Moves all appropriately.  Normal ROM for age. No lymphadenopathy.  Neuro: Oriented to PPT.  Normal mood. Normal affect.     Pelvic:   Vulva: Normal appearance.  No lesions.  Vagina: No lesions or abnormalities noted.  Support: Normal pelvic support.  Urethra No masses tenderness or scarring.  Meatus Normal size without lesions or prolapse.  Cervix: Normal appearance.  No lesions.  Anus: Normal exam.  No lesions.  Perineum: Normal exam.  No lesions.        Bimanual   Adnexae: No masses.  Non-tender to palpation.  Uterus: Enlarged. 149BPM  Non-tender.  Mobile.  AV.  Adnexae: No masses.  Non-tender to palpation.  Cul-de-sac: Negative for abnormality.  Adnexae: No masses.  Non-tender to palpation.         Pelvimetry   Diagonal: Reached.  Spines: Average.  Sacrum: Concave.  Pubic Arch: Normal.

## 2020-02-13 LAB — AFP, SERUM, OPEN SPINA BIFIDA
AFP MoM: 1.37
AFP Value: 40.4 ng/mL
Gest. Age on Collection Date: 15 wk
Maternal Age At EDD: 22.1 a
OSBR Risk 1 IN: 7840
Test Results:: NEGATIVE
Weight: 170 [lb_av]

## 2020-03-10 ENCOUNTER — Other Ambulatory Visit: Payer: Self-pay

## 2020-03-10 ENCOUNTER — Ambulatory Visit (INDEPENDENT_AMBULATORY_CARE_PROVIDER_SITE_OTHER): Payer: Managed Care, Other (non HMO)

## 2020-03-10 DIAGNOSIS — Z363 Encounter for antenatal screening for malformations: Secondary | ICD-10-CM

## 2020-03-10 DIAGNOSIS — Z3A19 19 weeks gestation of pregnancy: Secondary | ICD-10-CM | POA: Diagnosis not present

## 2020-03-10 DIAGNOSIS — Z3492 Encounter for supervision of normal pregnancy, unspecified, second trimester: Secondary | ICD-10-CM

## 2020-03-10 NOTE — Progress Notes (Signed)
ROB-Pt present for routine prenatal care. Pt stated having really bad allergies sxs, runny/itchy eyes, and sneezing. Pt stated that she gets  COVID tested weekly at her job and it was negative. Pt state that she was doing well no other problems.

## 2020-03-10 NOTE — Patient Instructions (Addendum)
Second Trimester of Pregnancy  The second trimester is from week 14 through week 27 (month 4 through 6). This is often the time in pregnancy that you feel your best. Often times, morning sickness has lessened or quit. You may have more energy, and you may get hungry more often. Your unborn baby is growing rapidly. At the end of the sixth month, he or she is about 9 inches long and weighs about 1 pounds. You will likely feel the baby move between 18 and 20 weeks of pregnancy. Follow these instructions at home: Medicines  Take over-the-counter and prescription medicines only as told by your doctor. Some medicines are safe and some medicines are not safe during pregnancy.  Take a prenatal vitamin that contains at least 600 micrograms (mcg) of folic acid.  If you have trouble pooping (constipation), take medicine that will make your stool soft (stool softener) if your doctor approves. Eating and drinking   Eat regular, healthy meals.  Avoid raw meat and uncooked cheese.  If you get low calcium from the food you eat, talk to your doctor about taking a daily calcium supplement.  Avoid foods that are high in fat and sugars, such as fried and sweet foods.  If you feel sick to your stomach (nauseous) or throw up (vomit): ? Eat 4 or 5 small meals a day instead of 3 large meals. ? Try eating a few soda crackers. ? Drink liquids between meals instead of during meals.  To prevent constipation: ? Eat foods that are high in fiber, like fresh fruits and vegetables, whole grains, and beans. ? Drink enough fluids to keep your pee (urine) clear or pale yellow. Activity  Exercise only as told by your doctor. Stop exercising if you start to have cramps.  Do not exercise if it is too hot, too humid, or if you are in a place of great height (high altitude).  Avoid heavy lifting.  Wear low-heeled shoes. Sit and stand up straight.  You can continue to have sex unless your doctor tells you not  to. Relieving pain and discomfort  Wear a good support bra if your breasts are tender.  Take warm water baths (sitz baths) to soothe pain or discomfort caused by hemorrhoids. Use hemorrhoid cream if your doctor approves.  Rest with your legs raised if you have leg cramps or low back pain.  If you develop puffy, bulging veins (varicose veins) in your legs: ? Wear support hose or compression stockings as told by your doctor. ? Raise (elevate) your feet for 15 minutes, 3-4 times a day. ? Limit salt in your food. Prenatal care  Write down your questions. Take them to your prenatal visits.  Keep all your prenatal visits as told by your doctor. This is important. Safety  Wear your seat belt when driving.  Make a list of emergency phone numbers, including numbers for family, friends, the hospital, and police and fire departments. General instructions  Ask your doctor about the right foods to eat or for help finding a counselor, if you need these services.  Ask your doctor about local prenatal classes. Begin classes before month 6 of your pregnancy.  Do not use hot tubs, steam rooms, or saunas.  Do not douche or use tampons or scented sanitary pads.  Do not cross your legs for long periods of time.  Visit your dentist if you have not done so. Use a soft toothbrush to brush your teeth. Floss gently.  Avoid all smoking, herbs,   and alcohol. Avoid drugs that are not approved by your doctor.  Do not use any products that contain nicotine or tobacco, such as cigarettes and e-cigarettes. If you need help quitting, ask your doctor.  Avoid cat litter boxes and soil used by cats. These carry germs that can cause birth defects in the baby and can cause a loss of your baby (miscarriage) or stillbirth. Contact a doctor if:  You have mild cramps or pressure in your lower belly.  You have pain when you pee (urinate).  You have bad smelling fluid coming from your vagina.  You continue to  feel sick to your stomach (nauseous), throw up (vomit), or have watery poop (diarrhea).  You have a nagging pain in your belly area.  You feel dizzy. Get help right away if:  You have a fever.  You are leaking fluid from your vagina.  You have spotting or bleeding from your vagina.  You have severe belly cramping or pain.  You lose or gain weight rapidly.  You have trouble catching your breath and have chest pain.  You notice sudden or extreme puffiness (swelling) of your face, hands, ankles, feet, or legs.  You have not felt the baby move in over an hour.  You have severe headaches that do not go away when you take medicine.  You have trouble seeing. Summary  The second trimester is from week 14 through week 27 (months 4 through 6). This is often the time in pregnancy that you feel your best.  To take care of yourself and your unborn baby, you will need to eat healthy meals, take medicines only if your doctor tells you to do so, and do activities that are safe for you and your baby.  Call your doctor if you get sick or if you notice anything unusual about your pregnancy. Also, call your doctor if you need help with the right food to eat, or if you want to know what activities are safe for you. This information is not intended to replace advice given to you by your health care provider. Make sure you discuss any questions you have with your health care provider. Document Revised: 04/10/2019 Document Reviewed: 01/22/2017 Elsevier Patient Education  2020 [employer] Common Medications Safe in Pregnancy  Acne:      Constipation:  Benzoyl Peroxide     Colace  Clindamycin      Dulcolax Suppository  Topica Erythromycin     Fibercon  Salicylic Acid      Metamucil         Miralax AVOID:        Senakot   Accutane    Cough:  Retin-A       Cough Drops  Tetracycline      Phenergan w/ Codeine if Rx  Minocycline      Robitussin (Plain &  DM)  Antibiotics:     Crabs/Lice:  Ceclor       RID  Cephalosporins    AVOID:  E-Mycins      Kwell  Keflex  Macrobid/Macrodantin   Diarrhea:  Penicillin      Kao-Pectate  Zithromax      Imodium AD         PUSH FLUIDS AVOID:       Cipro     Fever:  Tetracycline      Tylenol (Regular or Extra  Minocycline       Strength)  Levaquin      Extra Strength-Do not            Exceed 8 tabs/24 hrs Caffeine:        <235m/day (equiv. To 1 cup of coffee or  approx. 3 12 oz sodas)         Gas: Cold/Hayfever:       Gas-X  Benadryl      Mylicon  Claritin       Phazyme  **Claritin-D        Chlor-Trimeton    Headaches:  Dimetapp      ASA-Free Excedrin  Drixoral-Non-Drowsy     Cold Compress  Mucinex (Guaifenasin)     Tylenol (Regular or Extra  Sudafed/Sudafed-12 Hour     Strength)  **Sudafed PE Pseudoephedrine   Tylenol Cold & Sinus     Vicks Vapor Rub  Zyrtec  **AVOID if Problems With Blood Pressure         Heartburn: Avoid lying down for at least 1 hour after meals  Aciphex      Maalox     Rash:  Milk of Magnesia     Benadryl    Mylanta       1% Hydrocortisone Cream  Pepcid  Pepcid Complete   Sleep Aids:  Prevacid      Ambien   Prilosec       Benadryl  Rolaids       Chamomile Tea  Tums (Limit 4/day)     Unisom  Zantac       Tylenol PM         Warm milk-add vanilla or  Hemorrhoids:       Sugar for taste  Anusol/Anusol H.C.  (RX: Analapram 2.5%)  Sugar Substitutes:  Hydrocortisone OTC     Ok in moderation  Preparation H      Tucks        Vaseline lotion applied to tissue with wiping    Herpes:     Throat:  Acyclovir      Oragel  Famvir  Valtrex     Vaccines:         Flu Shot Leg Cramps:       *Gardasil  Benadryl      Hepatitis A         Hepatitis B Nasal Spray:       Pneumovax  Saline Nasal Spray     Polio Booster         Tetanus Nausea:       Tuberculosis test or PPD  Vitamin B6 25 mg TID   AVOID:    Dramamine      *Gardasil  Emetrol       Live  Poliovirus  Ginger Root 250 mg QID    MMR (measles, mumps &  High Complex Carbs @ Bedtime    rebella)  Sea Bands-Accupressure    Varicella (Chickenpox)  Unisom 1/2 tab TID     *No known complications           If received before Pain:         Known pregnancy;   Darvocet       Resume series after  Lortab        Delivery  Percocet    Yeast:   Tramadol      Femstat  Tylenol 3      Gyne-lotrimin  Ultram       Monistat  Vicodin           MISC:         All Sunscreens  Hair Coloring/highlights          Insect Repellant's          (Including DEET)         Mystic Tans Breastfeeding  Choosing to breastfeed is one of the best decisions you can make for yourself and your baby. A change in hormones during pregnancy causes your breasts to make breast milk in your milk-producing glands. Hormones prevent breast milk from being released before your baby is born. They also prompt milk flow after birth. Once breastfeeding has begun, thoughts of your baby, as well as his or her sucking or crying, can stimulate the release of milk from your milk-producing glands. Benefits of breastfeeding Research shows that breastfeeding offers many health benefits for infants and mothers. It also offers a cost-free and convenient way to feed your baby. For your baby  Your first milk (colostrum) helps your baby's digestive system to function better.  Special cells in your milk (antibodies) help your baby to fight off infections.  Breastfed babies are less likely to develop asthma, allergies, obesity, or type 2 diabetes. They are also at lower risk for sudden infant death syndrome (SIDS).  Nutrients in breast milk are better able to meet your baby's needs compared to infant formula.  Breast milk improves your baby's brain development. For you  Breastfeeding helps to create a very special bond between you and your baby.  Breastfeeding is convenient. Breast milk costs nothing and is always available at the  correct temperature.  Breastfeeding helps to burn calories. It helps you to lose the weight that you gained during pregnancy.  Breastfeeding makes your uterus return faster to its size before pregnancy. It also slows bleeding (lochia) after you give birth.  Breastfeeding helps to lower your risk of developing type 2 diabetes, osteoporosis, rheumatoid arthritis, cardiovascular disease, and breast, ovarian, uterine, and endometrial cancer later in life. Breastfeeding basics Starting breastfeeding  Find a comfortable place to sit or lie down, with your neck and back well-supported.  Place a pillow or a rolled-up blanket under your baby to bring him or her to the level of your breast (if you are seated). Nursing pillows are specially designed to help support your arms and your baby while you breastfeed.  Make sure that your baby's tummy (abdomen) is facing your abdomen.  Gently massage your breast. With your fingertips, massage from the outer edges of your breast inward toward the nipple. This encourages milk flow. If your milk flows slowly, you may need to continue this action during the feeding.  Support your breast with 4 fingers underneath and your thumb above your nipple (make the letter "C" with your hand). Make sure your fingers are well away from your nipple and your baby's mouth.  Stroke your baby's lips gently with your finger or nipple.  When your baby's mouth is open wide enough, quickly bring your baby to your breast, placing your entire nipple and as much of the areola as possible into your baby's mouth. The areola is the colored area around your nipple. ? More areola should be visible above your baby's upper lip than below the lower lip. ? Your baby's lips should be opened and extended outward (flanged) to ensure an adequate, comfortable latch. ? Your baby's tongue should be between his or her lower gum and your breast.  Make sure that your baby's mouth is correctly positioned  around your nipple (latched). Your baby's lips should create a seal on your breast and be turned   out (everted).  It is common for your baby to suck about 2-3 minutes in order to start the flow of breast milk. Latching Teaching your baby how to latch onto your breast properly is very important. An improper latch can cause nipple pain, decreased milk supply, and poor weight gain in your baby. Also, if your baby is not latched onto your nipple properly, he or she may swallow some air during feeding. This can make your baby fussy. Burping your baby when you switch breasts during the feeding can help to get rid of the air. However, teaching your baby to latch on properly is still the best way to prevent fussiness from swallowing air while breastfeeding. Signs that your baby has successfully latched onto your nipple  Silent tugging or silent sucking, without causing you pain. Infant's lips should be extended outward (flanged).  Swallowing heard between every 3-4 sucks once your milk has started to flow (after your let-down milk reflex occurs).  Muscle movement above and in front of his or her ears while sucking. Signs that your baby has not successfully latched onto your nipple  Sucking sounds or smacking sounds from your baby while breastfeeding.  Nipple pain. If you think your baby has not latched on correctly, slip your finger into the corner of your baby's mouth to break the suction and place it between your baby's gums. Attempt to start breastfeeding again. Signs of successful breastfeeding Signs from your baby  Your baby will gradually decrease the number of sucks or will completely stop sucking.  Your baby will fall asleep.  Your baby's body will relax.  Your baby will retain a small amount of milk in his or her mouth.  Your baby will let go of your breast by himself or herself. Signs from you  Breasts that have increased in firmness, weight, and size 1-3 hours after  feeding.  Breasts that are softer immediately after breastfeeding.  Increased milk volume, as well as a change in milk consistency and color by the fifth day of breastfeeding.  Nipples that are not sore, cracked, or bleeding. Signs that your baby is getting enough milk  Wetting at least 1-2 diapers during the first 24 hours after birth.  Wetting at least 5-6 diapers every 24 hours for the first week after birth. The urine should be clear or pale yellow by the age of 5 days.  Wetting 6-8 diapers every 24 hours as your baby continues to grow and develop.  At least 3 stools in a 24-hour period by the age of 5 days. The stool should be soft and yellow.  At least 3 stools in a 24-hour period by the age of 7 days. The stool should be seedy and yellow.  No loss of weight greater than 10% of birth weight during the first 3 days of life.  Average weight gain of 4-7 oz (113-198 g) per week after the age of 4 days.  Consistent daily weight gain by the age of 5 days, without weight loss after the age of 2 weeks. After a feeding, your baby may spit up a small amount of milk. This is normal. Breastfeeding frequency and duration Frequent feeding will help you make more milk and can prevent sore nipples and extremely full breasts (breast engorgement). Breastfeed when you feel the need to reduce the fullness of your breasts or when your baby shows signs of hunger. This is called "breastfeeding on demand." Signs that your baby is hungry include:  Increased alertness, activity,   or restlessness.  Movement of the head from side to side.  Opening of the mouth when the corner of the mouth or cheek is stroked (rooting).  Increased sucking sounds, smacking lips, cooing, sighing, or squeaking.  Hand-to-mouth movements and sucking on fingers or hands.  Fussing or crying. Avoid introducing a pacifier to your baby in the first 4-6 weeks after your baby is born. After this time, you may choose to use a  pacifier. Research has shown that pacifier use during the first year of a baby's life decreases the risk of sudden infant death syndrome (SIDS). Allow your baby to feed on each breast as long as he or she wants. When your baby unlatches or falls asleep while feeding from the first breast, offer the second breast. Because newborns are often sleepy in the first few weeks of life, you may need to awaken your baby to get him or her to feed. Breastfeeding times will vary from baby to baby. However, the following rules can serve as a guide to help you make sure that your baby is properly fed:  Newborns (babies 4 weeks of age or younger) may breastfeed every 1-3 hours.  Newborns should not go without breastfeeding for longer than 3 hours during the day or 5 hours during the night.  You should breastfeed your baby a minimum of 8 times in a 24-hour period. Breast milk pumping     Pumping and storing breast milk allows you to make sure that your baby is exclusively fed your breast milk, even at times when you are unable to breastfeed. This is especially important if you go back to work while you are still breastfeeding, or if you are not able to be present during feedings. Your lactation consultant can help you find a method of pumping that works best for you and give you guidelines about how long it is safe to store breast milk. Caring for your breasts while you breastfeed Nipples can become dry, cracked, and sore while breastfeeding. The following recommendations can help keep your breasts moisturized and healthy:  Avoid using soap on your nipples.  Wear a supportive bra designed especially for nursing. Avoid wearing underwire-style bras or extremely tight bras (sports bras).  Air-dry your nipples for 3-4 minutes after each feeding.  Use only cotton bra pads to absorb leaked breast milk. Leaking of breast milk between feedings is normal.  Use lanolin on your nipples after breastfeeding. Lanolin  helps to maintain your skin's normal moisture barrier. Pure lanolin is not harmful (not toxic) to your baby. You may also hand express a few drops of breast milk and gently massage that milk into your nipples and allow the milk to air-dry. In the first few weeks after giving birth, some women experience breast engorgement. Engorgement can make your breasts feel heavy, warm, and tender to the touch. Engorgement peaks within 3-5 days after you give birth. The following recommendations can help to ease engorgement:  Completely empty your breasts while breastfeeding or pumping. You may want to start by applying warm, moist heat (in the shower or with warm, water-soaked hand towels) just before feeding or pumping. This increases circulation and helps the milk flow. If your baby does not completely empty your breasts while breastfeeding, pump any extra milk after he or she is finished.  Apply ice packs to your breasts immediately after breastfeeding or pumping, unless this is too uncomfortable for you. To do this: ? Put ice in a plastic bag. ? Place a   towel between your skin and the bag. ? Leave the ice on for 20 minutes, 2-3 times a day.  Make sure that your baby is latched on and positioned properly while breastfeeding. If engorgement persists after 48 hours of following these recommendations, contact your health care provider or a lactation consultant. Overall health care recommendations while breastfeeding  Eat 3 healthy meals and 3 snacks every day. Well-nourished mothers who are breastfeeding need an additional 450-500 calories a day. You can meet this requirement by increasing the amount of a balanced diet that you eat.  Drink enough water to keep your urine pale yellow or clear.  Rest often, relax, and continue to take your prenatal vitamins to prevent fatigue, stress, and low vitamin and mineral levels in your body (nutrient deficiencies).  Do not use any products that contain nicotine or  tobacco, such as cigarettes and e-cigarettes. Your baby may be harmed by chemicals from cigarettes that pass into breast milk and exposure to secondhand smoke. If you need help quitting, ask your health care provider.  Avoid alcohol.  Do not use illegal drugs or marijuana.  Talk with your health care provider before taking any medicines. These include over-the-counter and prescription medicines as well as vitamins and herbal supplements. Some medicines that may be harmful to your baby can pass through breast milk.  It is possible to become pregnant while breastfeeding. If birth control is desired, ask your health care provider about options that will be safe while breastfeeding your baby. Where to find more information: [employer]: www.llli.org Contact a health care provider if:  You feel like you want to stop breastfeeding or have become frustrated with breastfeeding.  Your nipples are cracked or bleeding.  Your breasts are red, tender, or warm.  You have: ? Painful breasts or nipples. ? A swollen area on either breast. ? A fever or chills. ? Nausea or vomiting. ? Drainage other than breast milk from your nipples.  Your breasts do not become full before feedings by the fifth day after you give birth.  You feel sad and depressed.  Your baby is: ? Too sleepy to eat well. ? Having trouble sleeping. ? More than 1 week old and wetting fewer than 6 diapers in a 24-hour period. ? Not gaining weight by 5 days of age.  Your baby has fewer than 3 stools in a 24-hour period.  Your baby's skin or the white parts of his or her eyes become yellow. Get help right away if:  Your baby is overly tired (lethargic) and does not want to wake up and feed.  Your baby develops an unexplained fever. Summary  Breastfeeding offers many health benefits for infant and mothers.  Try to breastfeed your infant when he or she shows early signs of hunger.  Gently tickle or stroke  your baby's lips with your finger or nipple to allow the baby to open his or her mouth. Bring the baby to your breast. Make sure that much of the areola is in your baby's mouth. Offer one side and burp the baby before you offer the other side.  Talk with your health care provider or lactation consultant if you have questions or you face problems as you breastfeed. This information is not intended to replace advice given to you by your health care provider. Make sure you discuss any questions you have with your health care provider. Document Revised: 03/13/2018 Document Reviewed: 01/18/2017 Elsevier Patient Education  2020 [employer] Inc[employer]  

## 2020-03-11 ENCOUNTER — Ambulatory Visit (INDEPENDENT_AMBULATORY_CARE_PROVIDER_SITE_OTHER): Payer: Managed Care, Other (non HMO) | Admitting: Obstetrics and Gynecology

## 2020-03-11 ENCOUNTER — Encounter: Payer: Self-pay | Admitting: Obstetrics and Gynecology

## 2020-03-11 VITALS — BP 139/87 | HR 112 | Wt 161.6 lb

## 2020-03-11 DIAGNOSIS — R030 Elevated blood-pressure reading, without diagnosis of hypertension: Secondary | ICD-10-CM

## 2020-03-11 DIAGNOSIS — J302 Other seasonal allergic rhinitis: Secondary | ICD-10-CM

## 2020-03-11 DIAGNOSIS — Z3482 Encounter for supervision of other normal pregnancy, second trimester: Secondary | ICD-10-CM

## 2020-03-11 DIAGNOSIS — Z3A19 19 weeks gestation of pregnancy: Secondary | ICD-10-CM

## 2020-03-11 LAB — POCT URINALYSIS DIPSTICK OB
Bilirubin, UA: NEGATIVE
Blood, UA: NEGATIVE
Glucose, UA: NEGATIVE
Ketones, UA: NEGATIVE
Leukocytes, UA: NEGATIVE
Nitrite, UA: NEGATIVE
POC,PROTEIN,UA: NEGATIVE
Spec Grav, UA: 1.015 (ref 1.010–1.025)
Urobilinogen, UA: 0.2 E.U./dL
pH, UA: 7 (ref 5.0–8.0)

## 2020-03-11 NOTE — Progress Notes (Signed)
ROB: Patient complains of allergy symptoms, just started yesterday (itchy watery eyes, sneezing).  Discussed OTC antihistamines.  Had normal anatomy scan yesterday.  Discussed information regarding breastfeeding, doula program. Patient desires natural labor as she did with her last pregnancy.  Borderline BP noted today, mildly elevated BP noted last visit. Continue to monitor for possible HTN in pregnancy. RTC in 4 weeks.    The following were addressed during this visit:  Breastfeeding Education - Early initiation of breastfeeding    Comments: Keeps milk supply adequate, helps contract uterus and slow bleeding, and early milk is the perfect first food and is easy to digest.   - The importance of exclusive breastfeeding    Comments: Provides antibodies, Lower risk of breast and ovarian cancers, and type-2 diabetes,Helps your body recover, Reduced chance of SIDS.   - Feeding on demand or baby-led feeding    Comments: Helps prevent breastfeeding complications, helps bring in good milk supply, prevents under or overfeeding, and helps baby feel content and satisfied   - Frequent feeding to help assure optimal milk production    Comments: Making a full supply of milk requires frequent removal of milk from breasts, infant will eat 8-12 times in 24 hours, if separated from infant use breast massage, hand expression and/ or pumping to remove milk from breasts.   - Exclusive breastfeeding for the first 6 months    Comments: Builds a healthy milk supply and keeps it up, protects baby from sickness and disease, and breastmilk has everything your baby needs for the first 6 months.  - Early initiation of breastfeeding    Comments: Keeps milk supply adequate, helps contract uterus and slow bleeding, and early milk is the perfect first food and is easy to digest.   - The importance of exclusive breastfeeding    Comments: Provides antibodies, Lower risk of breast and ovarian cancers, and type-2  diabetes,Helps your body recover, Reduced chance of SIDS.   - Nonpharmacological pain relief methods for labor    Comments: Deep breathing, focusing on pleasant things, movement and walking, heating pads or cold compress, massage and relaxation, continuous support from someone you trust, and Doulas

## 2020-03-23 ENCOUNTER — Telehealth: Payer: Self-pay | Admitting: Obstetrics and Gynecology

## 2020-03-23 NOTE — Telephone Encounter (Signed)
Pt called in and stated that she wants to die her hair. The pt is wanting to know if its safe before she does it. Pt is requesting a call back. Please advise

## 2020-03-23 NOTE — Telephone Encounter (Signed)
Notified patient that it is safe to use hair dye on her hair.

## 2020-04-08 ENCOUNTER — Ambulatory Visit (INDEPENDENT_AMBULATORY_CARE_PROVIDER_SITE_OTHER): Payer: Managed Care, Other (non HMO) | Admitting: Obstetrics and Gynecology

## 2020-04-08 ENCOUNTER — Other Ambulatory Visit: Payer: Self-pay

## 2020-04-08 ENCOUNTER — Encounter: Payer: Self-pay | Admitting: Obstetrics and Gynecology

## 2020-04-08 VITALS — BP 138/82 | HR 118 | Wt 164.0 lb

## 2020-04-08 DIAGNOSIS — Z3482 Encounter for supervision of other normal pregnancy, second trimester: Secondary | ICD-10-CM

## 2020-04-08 DIAGNOSIS — Z3A23 23 weeks gestation of pregnancy: Secondary | ICD-10-CM

## 2020-04-08 LAB — POCT URINALYSIS DIPSTICK OB
Bilirubin, UA: NEGATIVE
Blood, UA: NEGATIVE
Glucose, UA: NEGATIVE
Ketones, UA: NEGATIVE
Leukocytes, UA: NEGATIVE
Nitrite, UA: NEGATIVE
POC,PROTEIN,UA: NEGATIVE
Spec Grav, UA: 1.02 (ref 1.010–1.025)
Urobilinogen, UA: 0.2 E.U./dL
pH, UA: 6 (ref 5.0–8.0)

## 2020-04-08 NOTE — Progress Notes (Signed)
ROB-Pt present for routine prenatal care.  Pt stated that she doing well no problems.  

## 2020-04-08 NOTE — Patient Instructions (Signed)
Second Trimester of Pregnancy  The second trimester is from week 14 through week 27 (month 4 through 6). This is often the time in pregnancy that you feel your best. Often times, morning sickness has lessened or quit. You may have more energy, and you may get hungry more often. Your unborn baby is growing rapidly. At the end of the sixth month, he or she is about 9 inches long and weighs about 1 pounds. You will likely feel the baby move between 18 and 20 weeks of pregnancy. Follow these instructions at home: Medicines  Take over-the-counter and prescription medicines only as told by your doctor. Some medicines are safe and some medicines are not safe during pregnancy.  Take a prenatal vitamin that contains at least 600 micrograms (mcg) of folic acid.  If you have trouble pooping (constipation), take medicine that will make your stool soft (stool softener) if your doctor approves. Eating and drinking   Eat regular, healthy meals.  Avoid raw meat and uncooked cheese.  If you get low calcium from the food you eat, talk to your doctor about taking a daily calcium supplement.  Avoid foods that are high in fat and sugars, such as fried and sweet foods.  If you feel sick to your stomach (nauseous) or throw up (vomit): ? Eat 4 or 5 small meals a day instead of 3 large meals. ? Try eating a few soda crackers. ? Drink liquids between meals instead of during meals.  To prevent constipation: ? Eat foods that are high in fiber, like fresh fruits and vegetables, whole grains, and beans. ? Drink enough fluids to keep your pee (urine) clear or pale yellow. Activity  Exercise only as told by your doctor. Stop exercising if you start to have cramps.  Do not exercise if it is too hot, too humid, or if you are in a place of great height (high altitude).  Avoid heavy lifting.  Wear low-heeled shoes. Sit and stand up straight.  You can continue to have sex unless your doctor tells you not  to. Relieving pain and discomfort  Wear a good support bra if your breasts are tender.  Take warm water baths (sitz baths) to soothe pain or discomfort caused by hemorrhoids. Use hemorrhoid cream if your doctor approves.  Rest with your legs raised if you have leg cramps or low back pain.  If you develop puffy, bulging veins (varicose veins) in your legs: ? Wear support hose or compression stockings as told by your doctor. ? Raise (elevate) your feet for 15 minutes, 3-4 times a day. ? Limit salt in your food. Prenatal care  Write down your questions. Take them to your prenatal visits.  Keep all your prenatal visits as told by your doctor. This is important. Safety  Wear your seat belt when driving.  Make a list of emergency phone numbers, including numbers for family, friends, the hospital, and police and fire departments. General instructions  Ask your doctor about the right foods to eat or for help finding a counselor, if you need these services.  Ask your doctor about local prenatal classes. Begin classes before month 6 of your pregnancy.  Do not use hot tubs, steam rooms, or saunas.  Do not douche or use tampons or scented sanitary pads.  Do not cross your legs for long periods of time.  Visit your dentist if you have not done so. Use a soft toothbrush to brush your teeth. Floss gently.  Avoid all smoking, herbs,   and alcohol. Avoid drugs that are not approved by your doctor.  Do not use any products that contain nicotine or tobacco, such as cigarettes and e-cigarettes. If you need help quitting, ask your doctor.  Avoid cat litter boxes and soil used by cats. These carry germs that can cause birth defects in the baby and can cause a loss of your baby (miscarriage) or stillbirth. Contact a doctor if:  You have mild cramps or pressure in your lower belly.  You have pain when you pee (urinate).  You have bad smelling fluid coming from your vagina.  You continue to  feel sick to your stomach (nauseous), throw up (vomit), or have watery poop (diarrhea).  You have a nagging pain in your belly area.  You feel dizzy. Get help right away if:  You have a fever.  You are leaking fluid from your vagina.  You have spotting or bleeding from your vagina.  You have severe belly cramping or pain.  You lose or gain weight rapidly.  You have trouble catching your breath and have chest pain.  You notice sudden or extreme puffiness (swelling) of your face, hands, ankles, feet, or legs.  You have not felt the baby move in over an hour.  You have severe headaches that do not go away when you take medicine.  You have trouble seeing. Summary  The second trimester is from week 14 through week 27 (months 4 through 6). This is often the time in pregnancy that you feel your best.  To take care of yourself and your unborn baby, you will need to eat healthy meals, take medicines only if your doctor tells you to do so, and do activities that are safe for you and your baby.  Call your doctor if you get sick or if you notice anything unusual about your pregnancy. Also, call your doctor if you need help with the right food to eat, or if you want to know what activities are safe for you. This information is not intended to replace advice given to you by your health care provider. Make sure you discuss any questions you have with your health care provider. Document Revised: 04/10/2019 Document Reviewed: 01/22/2017 Elsevier Patient Education  [place]. Common Medications Safe in Pregnancy  Acne:      Constipation:  Benzoyl Peroxide     Colace  Clindamycin      Dulcolax Suppository  Topica Erythromycin     Fibercon  Salicylic Acid      Metamucil         Miralax AVOID:        Senakot   Accutane    Cough:  Retin-A       Cough Drops  Tetracycline      Phenergan w/ Codeine if Rx  Minocycline      Robitussin (Plain &  DM)  Antibiotics:     Crabs/Lice:  Ceclor       RID  Cephalosporins    AVOID:  E-Mycins      Kwell  Keflex  Macrobid/Macrodantin   Diarrhea:  Penicillin      Kao-Pectate  Zithromax      Imodium AD         PUSH FLUIDS AVOID:       Cipro     Fever:  Tetracycline      Tylenol (Regular or Extra  Minocycline       Strength)  Levaquin      Extra Strength-Do not  Exceed 8 tabs/24 hrs Caffeine:        <235m/day (equiv. To 1 cup of coffee or  approx. 3 12 oz sodas)         Gas: Cold/Hayfever:       Gas-X  Benadryl      Mylicon  Claritin       Phazyme  **Claritin-D        Chlor-Trimeton    Headaches:  Dimetapp      ASA-Free Excedrin  Drixoral-Non-Drowsy     Cold Compress  Mucinex (Guaifenasin)     Tylenol (Regular or Extra  Sudafed/Sudafed-12 Hour     Strength)  **Sudafed PE Pseudoephedrine   Tylenol Cold & Sinus     Vicks Vapor Rub  Zyrtec  **AVOID if Problems With Blood Pressure         Heartburn: Avoid lying down for at least 1 hour after meals  Aciphex      Maalox     Rash:  Milk of Magnesia     Benadryl    Mylanta       1% Hydrocortisone Cream  Pepcid  Pepcid Complete   Sleep Aids:  Prevacid      Ambien   Prilosec       Benadryl  Rolaids       Chamomile Tea  Tums (Limit 4/day)     Unisom  Zantac       Tylenol PM         Warm milk-add vanilla or  Hemorrhoids:       Sugar for taste  Anusol/Anusol H.C.  (RX: Analapram 2.5%)  Sugar Substitutes:  Hydrocortisone OTC     Ok in moderation  Preparation H      Tucks        Vaseline lotion applied to tissue with wiping    Herpes:     Throat:  Acyclovir      Oragel  Famvir  Valtrex     Vaccines:         Flu Shot Leg Cramps:       *Gardasil  Benadryl      Hepatitis A         Hepatitis B Nasal Spray:       Pneumovax  Saline Nasal Spray     Polio Booster         Tetanus Nausea:       Tuberculosis test or PPD  Vitamin B6 25 mg TID   AVOID:    Dramamine      *Gardasil  Emetrol       Live  Poliovirus  Ginger Root 250 mg QID    MMR (measles, mumps &  High Complex Carbs @ Bedtime    rebella)  Sea Bands-Accupressure    Varicella (Chickenpox)  Unisom 1/2 tab TID     *No known complications           If received before Pain:         Known pregnancy;   Darvocet       Resume series after  Lortab        Delivery  Percocet    Yeast:   Tramadol      Femstat  Tylenol 3      Gyne-lotrimin  Ultram       Monistat  Vicodin           MISC:         All Sunscreens  Hair Coloring/highlights          Insect Repellant's          (Including DEET)         Mystic Tans Breastfeeding  Choosing to breastfeed is one of the best decisions you can make for yourself and your baby. A change in hormones during pregnancy causes your breasts to make breast milk in your milk-producing glands. Hormones prevent breast milk from being released before your baby is born. They also prompt milk flow after birth. Once breastfeeding has begun, thoughts of your baby, as well as his or her sucking or crying, can stimulate the release of milk from your milk-producing glands. Benefits of breastfeeding Research shows that breastfeeding offers many health benefits for infants and mothers. It also offers a cost-free and convenient way to feed your baby. For your baby  Your first milk (colostrum) helps your baby's digestive system to function better.  Special cells in your milk (antibodies) help your baby to fight off infections.  Breastfed babies are less likely to develop asthma, allergies, obesity, or type 2 diabetes. They are also at lower risk for sudden infant death syndrome (SIDS).  Nutrients in breast milk are better able to meet your baby's needs compared to infant formula.  Breast milk improves your baby's brain development. For you  Breastfeeding helps to create a very special bond between you and your baby.  Breastfeeding is convenient. Breast milk costs nothing and is always available at the  correct temperature.  Breastfeeding helps to burn calories. It helps you to lose the weight that you gained during pregnancy.  Breastfeeding makes your uterus return faster to its size before pregnancy. It also slows bleeding (lochia) after you give birth.  Breastfeeding helps to lower your risk of developing type 2 diabetes, osteoporosis, rheumatoid arthritis, cardiovascular disease, and breast, ovarian, uterine, and endometrial cancer later in life. Breastfeeding basics Starting breastfeeding  Find a comfortable place to sit or lie down, with your neck and back well-supported.  Place a pillow or a rolled-up blanket under your baby to bring him or her to the level of your breast (if you are seated). Nursing pillows are specially designed to help support your arms and your baby while you breastfeed.  Make sure that your baby's tummy (abdomen) is facing your abdomen.  Gently massage your breast. With your fingertips, massage from the outer edges of your breast inward toward the nipple. This encourages milk flow. If your milk flows slowly, you may need to continue this action during the feeding.  Support your breast with 4 fingers underneath and your thumb above your nipple (make the letter "C" with your hand). Make sure your fingers are well away from your nipple and your baby's mouth.  Stroke your baby's lips gently with your finger or nipple.  When your baby's mouth is open wide enough, quickly bring your baby to your breast, placing your entire nipple and as much of the areola as possible into your baby's mouth. The areola is the colored area around your nipple. ? More areola should be visible above your baby's upper lip than below the lower lip. ? Your baby's lips should be opened and extended outward (flanged) to ensure an adequate, comfortable latch. ? Your baby's tongue should be between his or her lower gum and your breast.  Make sure that your baby's mouth is correctly positioned  around your nipple (latched). Your baby's lips should create a seal on your breast and be turned   out (everted).  It is common for your baby to suck about 2-3 minutes in order to start the flow of breast milk. Latching Teaching your baby how to latch onto your breast properly is very important. An improper latch can cause nipple pain, decreased milk supply, and poor weight gain in your baby. Also, if your baby is not latched onto your nipple properly, he or she may swallow some air during feeding. This can make your baby fussy. Burping your baby when you switch breasts during the feeding can help to get rid of the air. However, teaching your baby to latch on properly is still the best way to prevent fussiness from swallowing air while breastfeeding. Signs that your baby has successfully latched onto your nipple  Silent tugging or silent sucking, without causing you pain. Infant's lips should be extended outward (flanged).  Swallowing heard between every 3-4 sucks once your milk has started to flow (after your let-down milk reflex occurs).  Muscle movement above and in front of his or her ears while sucking. Signs that your baby has not successfully latched onto your nipple  Sucking sounds or smacking sounds from your baby while breastfeeding.  Nipple pain. If you think your baby has not latched on correctly, slip your finger into the corner of your baby's mouth to break the suction and place it between your baby's gums. Attempt to start breastfeeding again. Signs of successful breastfeeding Signs from your baby  Your baby will gradually decrease the number of sucks or will completely stop sucking.  Your baby will fall asleep.  Your baby's body will relax.  Your baby will retain a small amount of milk in his or her mouth.  Your baby will let go of your breast by himself or herself. Signs from you  Breasts that have increased in firmness, weight, and size 1-3 hours after  feeding.  Breasts that are softer immediately after breastfeeding.  Increased milk volume, as well as a change in milk consistency and color by the fifth day of breastfeeding.  Nipples that are not sore, cracked, or bleeding. Signs that your baby is getting enough milk  Wetting at least 1-2 diapers during the first 24 hours after birth.  Wetting at least 5-6 diapers every 24 hours for the first week after birth. The urine should be clear or pale yellow by the age of 5 days.  Wetting 6-8 diapers every 24 hours as your baby continues to grow and develop.  At least 3 stools in a 24-hour period by the age of 5 days. The stool should be soft and yellow.  At least 3 stools in a 24-hour period by the age of 7 days. The stool should be seedy and yellow.  No loss of weight greater than 10% of birth weight during the first 3 days of life.  Average weight gain of 4-7 oz (113-198 g) per week after the age of 4 days.  Consistent daily weight gain by the age of 5 days, without weight loss after the age of 2 weeks. After a feeding, your baby may spit up a small amount of milk. This is normal. Breastfeeding frequency and duration Frequent feeding will help you make more milk and can prevent sore nipples and extremely full breasts (breast engorgement). Breastfeed when you feel the need to reduce the fullness of your breasts or when your baby shows signs of hunger. This is called "breastfeeding on demand." Signs that your baby is hungry include:  Increased alertness, activity,  or restlessness.  Movement of the head from side to side.  Opening of the mouth when the corner of the mouth or cheek is stroked (rooting).  Increased sucking sounds, smacking lips, cooing, sighing, or squeaking.  Hand-to-mouth movements and sucking on fingers or hands.  Fussing or crying. Avoid introducing a pacifier to your baby in the first 4-6 weeks after your baby is born. After this time, you may choose to use a  pacifier. Research has shown that pacifier use during the first year of a baby's life decreases the risk of sudden infant death syndrome (SIDS). Allow your baby to feed on each breast as long as he or she wants. When your baby unlatches or falls asleep while feeding from the first breast, offer the second breast. Because newborns are often sleepy in the first few weeks of life, you may need to awaken your baby to get him or her to feed. Breastfeeding times will vary from baby to baby. However, the following rules can serve as a guide to help you make sure that your baby is properly fed:  Newborns (babies 40 weeks of age or younger) may breastfeed every 1-3 hours.  Newborns should not go without breastfeeding for longer than 3 hours during the day or 5 hours during the night.  You should breastfeed your baby a minimum of 8 times in a 24-hour period. Breast milk pumping     Pumping and storing breast milk allows you to make sure that your baby is exclusively fed your breast milk, even at times when you are unable to breastfeed. This is especially important if you go back to work while you are still breastfeeding, or if you are not able to be present during feedings. Your lactation consultant can help you find a method of pumping that works best for you and give you guidelines about how long it is safe to store breast milk. Caring for your breasts while you breastfeed Nipples can become dry, cracked, and sore while breastfeeding. The following recommendations can help keep your breasts moisturized and healthy:  Avoid using soap on your nipples.  Wear a supportive bra designed especially for nursing. Avoid wearing underwire-style bras or extremely tight bras (sports bras).  Air-dry your nipples for 3-4 minutes after each feeding.  Use only cotton bra pads to absorb leaked breast milk. Leaking of breast milk between feedings is normal.  Use lanolin on your nipples after breastfeeding. Lanolin  helps to maintain your skin's normal moisture barrier. Pure lanolin is not harmful (not toxic) to your baby. You may also hand express a few drops of breast milk and gently massage that milk into your nipples and allow the milk to air-dry. In the first few weeks after giving birth, some women experience breast engorgement. Engorgement can make your breasts feel heavy, warm, and tender to the touch. Engorgement peaks within 3-5 days after you give birth. The following recommendations can help to ease engorgement:  Completely empty your breasts while breastfeeding or pumping. You may want to start by applying warm, moist heat (in the shower or with warm, water-soaked hand towels) just before feeding or pumping. This increases circulation and helps the milk flow. If your baby does not completely empty your breasts while breastfeeding, pump any extra milk after he or she is finished.  Apply ice packs to your breasts immediately after breastfeeding or pumping, unless this is too uncomfortable for you. To do this: ? Put ice in a plastic bag. ? Place a  towel between your skin and the bag. ? Leave the ice on for 20 minutes, 2-3 times a day.  Make sure that your baby is latched on and positioned properly while breastfeeding. If engorgement persists after 48 hours of following these recommendations, contact your health care provider or a Science writer. Overall health care recommendations while breastfeeding  Eat 3 healthy meals and 3 snacks every day. Well-nourished mothers who are breastfeeding need an additional 450-500 calories a day. You can meet this requirement by increasing the amount of a balanced diet that you eat.  Drink enough water to keep your urine pale yellow or clear.  Rest often, relax, and continue to take your prenatal vitamins to prevent fatigue, stress, and low vitamin and mineral levels in your body (nutrient deficiencies).  Do not use any products that contain nicotine or  tobacco, such as cigarettes and e-cigarettes. Your baby may be harmed by chemicals from cigarettes that pass into breast milk and exposure to secondhand smoke. If you need help quitting, ask your health care provider.  Avoid alcohol.  Do not use illegal drugs or marijuana.  Talk with your health care provider before taking any medicines. These include over-the-counter and prescription medicines as well as vitamins and herbal supplements. Some medicines that may be harmful to your baby can pass through breast milk.  It is possible to become pregnant while breastfeeding. If birth control is desired, ask your health care provider about options that will be safe while breastfeeding your baby. Where to find more information: [employer]: www.llli.org Contact a health care provider if:  You feel like you want to stop breastfeeding or have become frustrated with breastfeeding.  Your nipples are cracked or bleeding.  Your breasts are red, tender, or warm.  You have: ? Painful breasts or nipples. ? A swollen area on either breast. ? A fever or chills. ? Nausea or vomiting. ? Drainage other than breast milk from your nipples.  Your breasts do not become full before feedings by the fifth day after you give birth.  You feel sad and depressed.  Your baby is: ? Too sleepy to eat well. ? Having trouble sleeping. ? More than 19 week old and wetting fewer than 6 diapers in a 24-hour period. ? Not gaining weight by 61 days of age.  Your baby has fewer than 3 stools in a 24-hour period.  Your baby's skin or the white parts of his or her eyes become yellow. Get help right away if:  Your baby is overly tired (lethargic) and does not want to wake up and feed.  Your baby develops an unexplained fever. Summary  Breastfeeding offers many health benefits for infant and mothers.  Try to breastfeed your infant when he or she shows early signs of hunger.  Gently tickle or stroke  your baby's lips with your finger or nipple to allow the baby to open his or her mouth. Bring the baby to your breast. Make sure that much of the areola is in your baby's mouth. Offer one side and burp the baby before you offer the other side.  Talk with your health care provider or lactation consultant if you have questions or you face problems as you breastfeed. This information is not intended to replace advice given to you by your health care provider. Make sure you discuss any questions you have with your health care provider. Document Revised: 03/13/2018 Document Reviewed: 01/18/2017 Elsevier Patient Education  [place].

## 2020-04-08 NOTE — Progress Notes (Signed)
ROB: No complaints.  She reports her allergies are better.  Blood pressure is the same.  1 hour GCT next visit.

## 2020-04-27 ENCOUNTER — Other Ambulatory Visit: Payer: Self-pay

## 2020-04-27 ENCOUNTER — Inpatient Hospital Stay (HOSPITAL_COMMUNITY)
Admission: AD | Admit: 2020-04-27 | Discharge: 2020-04-28 | Disposition: A | Discharge status: Home / Self Care | Payer: Managed Care, Other (non HMO) | Attending: Obstetrics and Gynecology | Admitting: Obstetrics and Gynecology

## 2020-04-27 ENCOUNTER — Encounter (HOSPITAL_COMMUNITY): Payer: Self-pay | Admitting: Obstetrics and Gynecology

## 2020-04-27 DIAGNOSIS — Z886 Allergy status to analgesic agent status: Secondary | ICD-10-CM | POA: Diagnosis not present

## 2020-04-27 DIAGNOSIS — Z3A26 26 weeks gestation of pregnancy: Secondary | ICD-10-CM | POA: Insufficient documentation

## 2020-04-27 DIAGNOSIS — O26899 Other specified pregnancy related conditions, unspecified trimester: Secondary | ICD-10-CM

## 2020-04-27 DIAGNOSIS — R102 Pelvic and perineal pain: Secondary | ICD-10-CM | POA: Insufficient documentation

## 2020-04-27 DIAGNOSIS — R1030 Lower abdominal pain, unspecified: Secondary | ICD-10-CM | POA: Insufficient documentation

## 2020-04-27 DIAGNOSIS — O26892 Other specified pregnancy related conditions, second trimester: Secondary | ICD-10-CM

## 2020-04-27 DIAGNOSIS — R519 Headache, unspecified: Secondary | ICD-10-CM | POA: Insufficient documentation

## 2020-04-27 DIAGNOSIS — R109 Unspecified abdominal pain: Secondary | ICD-10-CM | POA: Diagnosis not present

## 2020-04-27 LAB — URINALYSIS, ROUTINE W REFLEX MICROSCOPIC
Bilirubin Urine: NEGATIVE
Glucose, UA: NEGATIVE mg/dL
Hgb urine dipstick: NEGATIVE
Ketones, ur: NEGATIVE mg/dL
Leukocytes,Ua: NEGATIVE
Nitrite: NEGATIVE
Protein, ur: NEGATIVE mg/dL
Specific Gravity, Urine: 1.02 (ref 1.005–1.030)
pH: 6 (ref 5.0–8.0)

## 2020-04-27 LAB — CBC
HCT: 31.6 % — ABNORMAL LOW (ref 36.0–46.0)
Hemoglobin: 10.1 g/dL — ABNORMAL LOW (ref 12.0–15.0)
MCH: 23.2 pg — ABNORMAL LOW (ref 26.0–34.0)
MCHC: 32 g/dL (ref 30.0–36.0)
MCV: 72.6 fL — ABNORMAL LOW (ref 80.0–100.0)
Platelets: 171 10*3/uL (ref 150–400)
RBC: 4.35 MIL/uL (ref 3.87–5.11)
RDW: 13.4 % (ref 11.5–15.5)
WBC: 9.6 10*3/uL (ref 4.0–10.5)
nRBC: 0 % (ref 0.0–0.2)

## 2020-04-27 LAB — COMPREHENSIVE METABOLIC PANEL
ALT: 15 U/L (ref 0–44)
AST: 20 U/L (ref 15–41)
Albumin: 2.8 g/dL — ABNORMAL LOW (ref 3.5–5.0)
Alkaline Phosphatase: 93 U/L (ref 38–126)
Anion gap: 11 (ref 5–15)
BUN: 6 mg/dL (ref 6–20)
CO2: 21 mmol/L — ABNORMAL LOW (ref 22–32)
Calcium: 9.1 mg/dL (ref 8.9–10.3)
Chloride: 105 mmol/L (ref 98–111)
Creatinine, Ser: 0.78 mg/dL (ref 0.44–1.00)
GFR calc Af Amer: >60 mL/min (ref >60)
GFR calc non Af Amer: >60 mL/min (ref >60)
Glucose, Bld: 128 mg/dL — ABNORMAL HIGH (ref 70–99)
Potassium: 3.6 mmol/L (ref 3.5–5.1)
Sodium: 137 mmol/L (ref 135–145)
Total Bilirubin: 0.5 mg/dL (ref 0.3–1.2)
Total Protein: 6 g/dL — ABNORMAL LOW (ref 6.5–8.1)

## 2020-04-27 MED ORDER — BUTALBITAL-APAP-CAFFEINE 50-325-40 MG PO TABS
2.0000 | ORAL_TABLET | Freq: Once | ORAL | Status: AC
  Administered 2020-04-27: 2 via ORAL
  Filled 2020-04-27: qty 2

## 2020-04-27 NOTE — MAU Note (Signed)
Pt reports +FM.

## 2020-04-27 NOTE — MAU Provider Note (Signed)
Chief Complaint:  Abdominal Pain and Hypertension   First Provider Initiated Contact with Patient 04/27/20 2255     HPI: Michelle Thompson is a 24 y.o. G2P1001 at 50w1dwho presents to maternity admissions reporting dizziness, lower abdominal pain, and decreased fetal movement.  States the dizzy feeling is similar to when she had hypertension in prior pregnancy.  No hypertension this pregnancy so far She denies LOF, vaginal bleeding, vaginal itching/burning, urinary symptoms, h/a, n/v, diarrhea, constipation or fever/chills.   Gets care at [facility], but wants to deliver here. Did not know they dont deliver here  Abdominal Pain This is a new problem. The current episode started today. The onset quality is gradual. The problem occurs intermittently. The pain is mild. The quality of the pain is cramping. The abdominal pain does not radiate. Pertinent negatives include no constipation, diarrhea, dysuria, fever, frequency, myalgias, nausea or vomiting. Nothing aggravates the pain. The pain is relieved by nothing.    RN note: Pt reports to MAU stating she "feels like my blood pressure is up, I feel dizzy". Pt reports contraction pain in lower abdomen and rates pain 7/10. Pt reports having an argument with FOB earlier today. Pt denies any physical abuse. Pt reports decrease FM pt states she has only felt two movements all day last FM was 2 hours ago. Pt feels safe at home.   Past Medical History: Past Medical History:  Diagnosis Date  . Hypertension     Past obstetric history: OB History  Gravida Para Term Preterm AB Living  2 1 1     1   SAB TAB Ectopic Multiple Live Births          1    # Outcome Date GA Lbr Len/2nd Weight Sex Delivery Anes PTL Lv  2 Current           1 Term 01/14/16    01/16/16 Vag-Spont   LIV    Past Surgical History: Past Surgical History:  Procedure Laterality Date  . NO PAST SURGERIES      Family History: Family History  Problem Relation Age of Onset  . Lupus Mother    . Healthy Father   . Asthma Brother   . Diabetes Maternal Grandmother   . COPD Maternal Grandmother   . Diabetes Maternal Grandfather   . Stroke Maternal Grandfather     Social History: Social History   Tobacco Use  . Smoking status: Never Smoker  . Smokeless tobacco: Never Used  Substance Use Topics  . Alcohol use: No  . Drug use: No    Allergies:  Allergies  Allergen Reactions  . Motrin [Ibuprofen] Other (See Comments)    Unknown reaction-childhood    Meds:  Medications Prior to Admission  Medication Sig Dispense Refill Last Dose  . Prenatal Vit-Fe Fumarate-FA (PRENATAL MULTIVITAMIN) TABS tablet Take 1 tablet by mouth daily at 12 noon.       I have reviewed patient's Past Medical Hx, Surgical Hx, Family Hx, Social Hx, medications and allergies.   ROS:  Review of Systems  Constitutional: Negative for chills and fever.  Respiratory: Negative for shortness of breath.   Gastrointestinal: Positive for abdominal pain. Negative for constipation, diarrhea, nausea and vomiting.  Genitourinary: Negative for dysuria and frequency.  Musculoskeletal: Negative for myalgias.  Neurological: Positive for dizziness.   Other systems negative  Physical Exam   Patient Vitals for the past 24 hrs:  BP Temp Temp src Pulse Resp SpO2  04/27/20 2250 134/77 -- -- (!) 113 --  98 %  04/27/20 2245 -- -- -- -- -- 98 %  04/27/20 2240 133/79 -- -- (!) 113 -- 98 %  04/27/20 2235 -- -- -- -- -- 98 %  04/27/20 2233 140/86 -- -- (!) 119 -- --  04/27/20 2232 140/86 98.8 F (37.1 C) Oral (!) 116 18 99 %   Constitutional: Well-developed, well-nourished female in no acute distress.  Cardiovascular: normal rate and rhythm Respiratory: normal effort, clear to auscultation bilaterally GI: Abd soft, non-tender, gravid appropriate for gestational age.   No rebound or guarding. MS: Extremities nontender, no edema, normal ROM Neurologic: Alert and oriented x 4. DTRs 2+ with no clonus GU: Neg  CVAT.  PELVIC EXAM: Dilation: Closed Effacement (%): Thick Exam by:: Hansel Feinstein CNM   FHT:  Baseline 140 , moderate variability, accelerations present, no decelerations Contractions: intermittent irritability   Labs: Results for orders placed or performed during the hospital encounter of 04/27/20 (from the past 24 hour(s))  Urinalysis, Routine w reflex microscopic     Status: Abnormal   Collection Time: 04/27/20 10:23 PM  Result Value Ref Range   Color, Urine YELLOW YELLOW   APPearance HAZY (A) CLEAR   Specific Gravity, Urine 1.020 1.005 - 1.030   pH 6.0 5.0 - 8.0   Glucose, UA NEGATIVE NEGATIVE mg/dL   Hgb urine dipstick NEGATIVE NEGATIVE   Bilirubin Urine NEGATIVE NEGATIVE   Ketones, ur NEGATIVE NEGATIVE mg/dL   Protein, ur NEGATIVE NEGATIVE mg/dL   Nitrite NEGATIVE NEGATIVE   Leukocytes,Ua NEGATIVE NEGATIVE  Protein / creatinine ratio, urine     Status: None   Collection Time: 04/27/20 10:56 PM  Result Value Ref Range   Creatinine, Urine 172.00 mg/dL   Total Protein, Urine 17 mg/dL   Protein Creatinine Ratio 0.10 0.00 - 0.15 mg/mg[Cre]  CBC     Status: Abnormal   Collection Time: 04/27/20 11:00 PM  Result Value Ref Range   WBC 9.6 4.0 - 10.5 K/uL   RBC 4.35 3.87 - 5.11 MIL/uL   Hemoglobin 10.1 (L) 12.0 - 15.0 g/dL   HCT 31.6 (L) 36.0 - 46.0 %   MCV 72.6 (L) 80.0 - 100.0 fL   MCH 23.2 (L) 26.0 - 34.0 pg   MCHC 32.0 30.0 - 36.0 g/dL   RDW 13.4 11.5 - 15.5 %   Platelets 171 150 - 400 K/uL   nRBC 0.0 0.0 - 0.2 %  Comprehensive metabolic panel     Status: Abnormal   Collection Time: 04/27/20 11:00 PM  Result Value Ref Range   Sodium 137 135 - 145 mmol/L   Potassium 3.6 3.5 - 5.1 mmol/L   Chloride 105 98 - 111 mmol/L   CO2 21 (L) 22 - 32 mmol/L   Glucose, Bld 128 (H) 70 - 99 mg/dL   BUN 6 6 - 20 mg/dL   Creatinine, Ser 0.78 0.44 - 1.00 mg/dL   Calcium 9.1 8.9 - 10.3 mg/dL   Total Protein 6.0 (L) 6.5 - 8.1 g/dL   Albumin 2.8 (L) 3.5 - 5.0 g/dL   AST 20 15 - 41  U/L   ALT 15 0 - 44 U/L   Alkaline Phosphatase 93 38 - 126 U/L   Total Bilirubin 0.5 0.3 - 1.2 mg/dL   GFR calc non Af Amer >60 >60 mL/min   GFR calc Af Amer >60 >60 mL/min   Anion gap 11 5 - 15  Fetal fibronectin     Status: None   Collection Time: 04/27/20 11:28 PM  Result Value Ref Range   Fetal Fibronectin NEGATIVE NEGATIVE    O/Positive/-- (01/11 1612)  Imaging:  No results found.  MAU Course/MDM: I have ordered labs and reviewed results. No abnormal results NST reviewed, reactive Fetal fibronectin done due to pt report of contractions. This is negative also, Patient reassured  Treatments in MAU included Fioricet for headache which did relieve the headache..    Assessment: Single IUP at [redacted]w[redacted]d History of hypertension, normotensive after initial BP tonight Headache Pelvic cramping   Plan: Discharge home Preterm Labor precautions and fetal kick counts Follow up in Office for prenatal visits  List of [facility] offices provided Encouraged to return here or to other Urgent Care/ED if she develops worsening of symptoms, increase in pain, fever, or other concerning symptoms.  Pt stable at time of discharge.  Wynelle Bourgeois CNM, MSN Certified Nurse-Midwife 04/27/2020 10:55 PM

## 2020-04-27 NOTE — MAU Note (Addendum)
Pt reports to MAU stating she "feels like my blood pressure is up, I feel dizzy". Pt reports contraction pain in lower abdomen and rates pain 7/10. Pt reports having an argument with FOB earlier today. Pt denies any physical abuse. Pt reports decrease FM pt states she has only felt two movements all day last FM was 2 hours ago. Pt feels safe at home.    Pt given clicker and told to hit it when she feels FM.

## 2020-04-28 DIAGNOSIS — Z3A26 26 weeks gestation of pregnancy: Secondary | ICD-10-CM | POA: Diagnosis not present

## 2020-04-28 DIAGNOSIS — R109 Unspecified abdominal pain: Secondary | ICD-10-CM

## 2020-04-28 DIAGNOSIS — O26892 Other specified pregnancy related conditions, second trimester: Secondary | ICD-10-CM | POA: Diagnosis not present

## 2020-04-28 DIAGNOSIS — R519 Headache, unspecified: Secondary | ICD-10-CM

## 2020-04-28 LAB — PROTEIN / CREATININE RATIO, URINE
Creatinine, Urine: 172 mg/dL
Protein Creatinine Ratio: 0.1 mg/mg{Cre} (ref 0.00–0.15)
Total Protein, Urine: 17 mg/dL

## 2020-04-28 LAB — FETAL FIBRONECTIN: Fetal Fibronectin: NEGATIVE

## 2020-04-28 NOTE — Discharge Instructions (Signed)
Abdominal Pain During Pregnancy  Abdominal pain is common during pregnancy, and has many possible causes. Some causes are more serious than others, and sometimes the cause is not known. Abdominal pain can be a sign that labor is starting. It can also be caused by normal growth and stretching of muscles and ligaments during pregnancy. Always tell your health care provider if you have any abdominal pain. Follow these instructions at home:  Do not have sex or put anything in your vagina until your pain goes away completely.  Get plenty of rest until your pain improves.  Drink enough fluid to keep your urine pale yellow.  Take over-the-counter and prescription medicines only as told by your health care provider.  Keep all follow-up visits as told by your health care provider. This is important. Contact a health care provider if:  Your pain continues or gets worse after resting.  You have lower abdominal pain that: ? Comes and goes at regular intervals. ? Spreads to your back. ? Is similar to menstrual cramps.  You have pain or burning when you urinate. Get help right away if:  You have a fever or chills.  You have vaginal bleeding.  You are leaking fluid from your vagina.  You are passing tissue from your vagina.  You have vomiting or diarrhea that lasts for more than 24 hours.  Your baby is moving less than usual.  You feel very weak or faint.  You have shortness of breath.  You develop severe pain in your upper abdomen. Summary  Abdominal pain is common during pregnancy, and has many possible causes.  If you experience abdominal pain during pregnancy, tell your health care provider right away.  Follow your health care provider's home care instructions and keep all follow-up visits as directed. This information is not intended to replace advice given to you by your health care provider. Make sure you discuss any questions you have with your health care  provider. Document Revised: 04/06/2019 Document Reviewed: 03/21/2017 Elsevier Patient Education  2020 [employer]   [place] [facility] Providers    [facility]       Phone: [phone]  [facility]   Phone: [phone]  [facility] [place]  Phone: [phone]  [facility] [place]  Phone: [phone]  [facility] [place]  Phone: [phone]  [facility]   Phone: [phone]  [facility]       Phone: [phone]  [facility]    Phone: [phone]   [facility]    Phone: [phone]  [facility]    Phone: [phone]  [facility]    Phone: [phone]  [facility]       Phone: [phone]   [facility]  Phone: [phone]  [facility]    Phone: [phone]  [facility]   Phone: [phone]  [facility]      Phone: [phone]  [facility]    Phone: [phone]   

## 2020-05-05 ENCOUNTER — Other Ambulatory Visit: Payer: Managed Care, Other (non HMO)

## 2020-05-05 ENCOUNTER — Ambulatory Visit (INDEPENDENT_AMBULATORY_CARE_PROVIDER_SITE_OTHER): Payer: Managed Care, Other (non HMO) | Admitting: Obstetrics and Gynecology

## 2020-05-05 ENCOUNTER — Other Ambulatory Visit: Payer: Self-pay

## 2020-05-05 ENCOUNTER — Encounter: Payer: Self-pay | Admitting: Obstetrics and Gynecology

## 2020-05-05 VITALS — BP 127/75 | HR 105 | Wt 164.9 lb

## 2020-05-05 DIAGNOSIS — Z3482 Encounter for supervision of other normal pregnancy, second trimester: Secondary | ICD-10-CM | POA: Diagnosis not present

## 2020-05-05 DIAGNOSIS — Z111 Encounter for screening for respiratory tuberculosis: Secondary | ICD-10-CM

## 2020-05-05 DIAGNOSIS — Z3A27 27 weeks gestation of pregnancy: Secondary | ICD-10-CM

## 2020-05-05 DIAGNOSIS — Z23 Encounter for immunization: Secondary | ICD-10-CM

## 2020-05-05 LAB — POCT URINALYSIS DIPSTICK OB
Bilirubin, UA: NEGATIVE
Blood, UA: NEGATIVE
Glucose, UA: NEGATIVE
Ketones, UA: NEGATIVE
Leukocytes, UA: NEGATIVE
Nitrite, UA: NEGATIVE
POC,PROTEIN,UA: NEGATIVE
Spec Grav, UA: <=1.005 — AB (ref 1.010–1.025)
Urobilinogen, UA: 0.2 E.U./dL
pH, UA: 6.5 (ref 5.0–8.0)

## 2020-05-05 MED ORDER — TETANUS-DIPHTH-ACELL PERTUSSIS 5-2.5-18.5 LF-MCG/0.5 IM SUSP
0.5000 mL | Freq: Once | INTRAMUSCULAR | Status: AC
  Administered 2020-05-05: 0.5 mL via INTRAMUSCULAR

## 2020-05-05 NOTE — Patient Instructions (Addendum)
https://www.cdc.gov/vaccines/hcp/vis/vis-statements/tdap.pdf">  Tdap (Tetanus, Diphtheria, Pertussis) Vaccine: What You Need to Know 1. Why get vaccinated? Tdap vaccine can prevent tetanus, diphtheria, and pertussis. Diphtheria and pertussis spread from person to person. Tetanus enters the body through cuts or wounds.  TETANUS (T) causes painful stiffening of the muscles. Tetanus can lead to serious health problems, including being unable to open the mouth, having trouble swallowing and breathing, or death.  DIPHTHERIA (D) can lead to difficulty breathing, heart failure, paralysis, or death.  PERTUSSIS (aP), also known as "whooping cough," can cause uncontrollable, violent coughing which makes it hard to breathe, eat, or drink. Pertussis can be extremely serious in babies and young children, causing pneumonia, convulsions, brain damage, or death. In teens and adults, it can cause weight loss, loss of bladder control, passing out, and rib fractures from severe coughing. 2. Tdap vaccine Tdap is only for children 7 years and older, adolescents, and adults.  Adolescents should receive a single dose of Tdap, preferably at age 53 or 35 years. Pregnant women should get a dose of Tdap during every pregnancy, to protect the newborn from pertussis. Infants are most at risk for severe, life-threatening complications from pertussis. Adults who have never received Tdap should get a dose of Tdap. Also, adults should receive a booster dose every 10 years, or earlier in the case of a severe and dirty wound or burn. Booster doses can be either Tdap or Td (a different vaccine that protects against tetanus and diphtheria but not pertussis). Tdap may be given at the same time as other vaccines. 3. Talk with your health care provider Tell your vaccine provider if the person getting the vaccine:  Has had an allergic reaction after a previous dose of any vaccine that protects against tetanus, diphtheria, or pertussis,  or has any severe, life-threatening allergies.  Has had a coma, decreased level of consciousness, or prolonged seizures within 7 days after a previous dose of any pertussis vaccine (DTP, DTaP, or Tdap).  Has seizures or another nervous system problem.  Has ever had Guillain-Barr Syndrome (also called GBS).  Has had severe pain or swelling after a previous dose of any vaccine that protects against tetanus or diphtheria. In some cases, your health care provider may decide to postpone Tdap vaccination to a future visit.  People with minor illnesses, such as a cold, may be vaccinated. People who are moderately or severely ill should usually wait until they recover before getting Tdap vaccine.  Your health care provider can give you more information. 4. Risks of a vaccine reaction  Pain, redness, or swelling where the shot was given, mild fever, headache, feeling tired, and nausea, vomiting, diarrhea, or stomachache sometimes happen after Tdap vaccine. People sometimes faint after medical procedures, including vaccination. Tell your provider if you feel dizzy or have vision changes or ringing in the ears.  As with any medicine, there is a very remote chance of a vaccine causing a severe allergic reaction, other serious injury, or death. 5. What if there is a serious problem? An allergic reaction could occur after the vaccinated person leaves the clinic. If you see signs of a severe allergic reaction (hives, swelling of the face and throat, difficulty breathing, a fast heartbeat, dizziness, or weakness), call 9-1-1 and get the person to the nearest hospital. For other signs that concern you, call your health care provider.  Adverse reactions should be reported to the Vaccine Adverse Event Reporting System (VAERS). Your health care provider will usually file this report,  or you can do it yourself. Visit the VAERS website at www.vaers.SamedayNews.es or call (249)219-2943. VAERS is only for reporting  reactions, and VAERS staff do not give medical advice. 6. The National Vaccine Injury Compensation Program The [employer] Vaccine Injury Compensation Program (VICP) is a federal program that was created to compensate people who may have been injured by certain vaccines. Visit the VICP website at GoldCloset.com.ee or call 682-100-1091 to learn about the program and about filing a claim. There is a time limit to file a claim for compensation. 7. How can I learn more?  Ask your health care provider.  Call your local or state health department.  Contact the Centers for Disease Control and Prevention (CDC): ? Call (641) 832-7917 (1-800-CDC-INFO) or ? Visit CDC's website at http://hunter.com/ Vaccine Information Statement Tdap (Tetanus, Diphtheria, Pertussis) Vaccine (04/01/2019) This information is not intended to replace advice given to you by your health care provider. Make sure you discuss any questions you have with your health care provider. Document Revised: 04/10/2019 Document Reviewed: 04/13/2019 Elsevier Patient Education  [place].   Common Medications Safe in Pregnancy  Acne:      Constipation:  Benzoyl Peroxide     Colace  Clindamycin      Dulcolax Suppository  Topica Erythromycin     Fibercon  Salicylic Acid      Metamucil         Miralax AVOID:        Senakot   Accutane    Cough:  Retin-A       Cough Drops  Tetracycline      Phenergan w/ Codeine if Rx  Minocycline      Robitussin (Plain & DM)  Antibiotics:     Crabs/Lice:  Ceclor       RID  Cephalosporins    AVOID:  E-Mycins      Kwell  Keflex  Macrobid/Macrodantin   Diarrhea:  Penicillin      Kao-Pectate  Zithromax      Imodium AD         PUSH FLUIDS AVOID:       Cipro     Fever:  Tetracycline      Tylenol (Regular or Extra  Minocycline       Strength)  Levaquin      Extra Strength-Do not          Exceed 8 tabs/24 hrs Caffeine:        '200mg'$ /day (equiv. To 1 cup of coffee  or  approx. 3 12 oz sodas)         Gas: Cold/Hayfever:       Gas-X  Benadryl      Mylicon  Claritin       Phazyme  **Claritin-D        Chlor-Trimeton    Headaches:  Dimetapp      ASA-Free Excedrin  Drixoral-Non-Drowsy     Cold Compress  Mucinex (Guaifenasin)     Tylenol (Regular or Extra  Sudafed/Sudafed-12 Hour     Strength)  **Sudafed PE Pseudoephedrine   Tylenol Cold & Sinus     Vicks Vapor Rub  Zyrtec  **AVOID if Problems With Blood Pressure         Heartburn: Avoid lying down for at least 1 hour after meals  Aciphex      Maalox     Rash:  Milk of Magnesia     Benadryl    Mylanta       1% Hydrocortisone Cream  Pepcid  Pepcid Complete   Sleep Aids:  Prevacid      Ambien   Prilosec       Benadryl  Rolaids       Chamomile Tea  Tums (Limit 4/day)     Unisom  Zantac       Tylenol PM         Warm milk-add vanilla or  Hemorrhoids:       Sugar for taste  Anusol/Anusol H.C.  (RX: Analapram 2.5%)  Sugar Substitutes:  Hydrocortisone OTC     Ok in moderation  Preparation H      Tucks        Vaseline lotion applied to tissue with wiping    Herpes:     Throat:  Acyclovir      Oragel  Famvir  Valtrex     Vaccines:         Flu Shot Leg Cramps:       *Gardasil  Benadryl      Hepatitis A         Hepatitis B Nasal Spray:       Pneumovax  Saline Nasal Spray     Polio Booster         Tetanus Nausea:       Tuberculosis test or PPD  Vitamin B6 25 mg TID   AVOID:    Dramamine      *Gardasil  Emetrol       Live Poliovirus  Ginger Root 250 mg QID    MMR (measles, mumps &  High Complex Carbs @ Bedtime    rebella)  Sea Bands-Accupressure    Varicella (Chickenpox)  Unisom 1/2 tab TID     *No known complications           If received before Pain:         Known pregnancy;   Darvocet       Resume series after  Lortab        Delivery  Percocet    Yeast:   Tramadol      Femstat  Tylenol 3      Gyne-lotrimin  Ultram       Monistat  Vicodin           MISC:         All  Sunscreens           Hair Coloring/highlights          Insect Repellant's          (Including DEET)         Mystic Tans Breastfeeding  Choosing to breastfeed is one of the best decisions you can make for yourself and your baby. A change in hormones during pregnancy causes your breasts to make breast milk in your milk-producing glands. Hormones prevent breast milk from being released before your baby is born. They also prompt milk flow after birth. Once breastfeeding has begun, thoughts of your baby, as well as his or her sucking or crying, can stimulate the release of milk from your milk-producing glands. Benefits of breastfeeding Research shows that breastfeeding offers many health benefits for infants and mothers. It also offers a cost-free and convenient way to feed your baby. For your baby  Your first milk (colostrum) helps your baby's digestive system to function better.  Special cells in your milk (antibodies) help your baby to fight off infections.  Breastfed babies are less likely to develop asthma, allergies, obesity, or type 2 diabetes. They are also at lower  risk for sudden infant death syndrome (SIDS).  Nutrients in breast milk are better able to meet your baby's needs compared to infant formula.  Breast milk improves your baby's brain development. For you  Breastfeeding helps to create a very special bond between you and your baby.  Breastfeeding is convenient. Breast milk costs nothing and is always available at the correct temperature.  Breastfeeding helps to burn calories. It helps you to lose the weight that you gained during pregnancy.  Breastfeeding makes your uterus return faster to its size before pregnancy. It also slows bleeding (lochia) after you give birth.  Breastfeeding helps to lower your risk of developing type 2 diabetes, osteoporosis, rheumatoid arthritis, cardiovascular disease, and breast, ovarian, uterine, and endometrial cancer later in  life. Breastfeeding basics Starting breastfeeding  Find a comfortable place to sit or lie down, with your neck and back well-supported.  Place a pillow or a rolled-up blanket under your baby to bring him or her to the level of your breast (if you are seated). Nursing pillows are specially designed to help support your arms and your baby while you breastfeed.  Make sure that your baby's tummy (abdomen) is facing your abdomen.  Gently massage your breast. With your fingertips, massage from the outer edges of your breast inward toward the nipple. This encourages milk flow. If your milk flows slowly, you may need to continue this action during the feeding.  Support your breast with 4 fingers underneath and your thumb above your nipple (make the letter "C" with your hand). Make sure your fingers are well away from your nipple and your baby's mouth.  Stroke your baby's lips gently with your finger or nipple.  When your baby's mouth is open wide enough, quickly bring your baby to your breast, placing your entire nipple and as much of the areola as possible into your baby's mouth. The areola is the colored area around your nipple. ? More areola should be visible above your baby's upper lip than below the lower lip. ? Your baby's lips should be opened and extended outward (flanged) to ensure an adequate, comfortable latch. ? Your baby's tongue should be between his or her lower gum and your breast.  Make sure that your baby's mouth is correctly positioned around your nipple (latched). Your baby's lips should create a seal on your breast and be turned out (everted).  It is common for your baby to suck about 2-3 minutes in order to start the flow of breast milk. Latching Teaching your baby how to latch onto your breast properly is very important. An improper latch can cause nipple pain, decreased milk supply, and poor weight gain in your baby. Also, if your baby is not latched onto your nipple  properly, he or she may swallow some air during feeding. This can make your baby fussy. Burping your baby when you switch breasts during the feeding can help to get rid of the air. However, teaching your baby to latch on properly is still the best way to prevent fussiness from swallowing air while breastfeeding. Signs that your baby has successfully latched onto your nipple  Silent tugging or silent sucking, without causing you pain. Infant's lips should be extended outward (flanged).  Swallowing heard between every 3-4 sucks once your milk has started to flow (after your let-down milk reflex occurs).  Muscle movement above and in front of his or her ears while sucking. Signs that your baby has not successfully latched onto your nipple  Sucking  sounds or smacking sounds from your baby while breastfeeding.  Nipple pain. If you think your baby has not latched on correctly, slip your finger into the corner of your baby's mouth to break the suction and place it between your baby's gums. Attempt to start breastfeeding again. Signs of successful breastfeeding Signs from your baby  Your baby will gradually decrease the number of sucks or will completely stop sucking.  Your baby will fall asleep.  Your baby's body will relax.  Your baby will retain a small amount of milk in his or her mouth.  Your baby will let go of your breast by himself or herself. Signs from you  Breasts that have increased in firmness, weight, and size 1-3 hours after feeding.  Breasts that are softer immediately after breastfeeding.  Increased milk volume, as well as a change in milk consistency and color by the fifth day of breastfeeding.  Nipples that are not sore, cracked, or bleeding. Signs that your baby is getting enough milk  Wetting at least 1-2 diapers during the first 24 hours after birth.  Wetting at least 5-6 diapers every 24 hours for the first week after birth. The urine should be clear or pale  yellow by the age of 5 days.  Wetting 6-8 diapers every 24 hours as your baby continues to grow and develop.  At least 3 stools in a 24-hour period by the age of 5 days. The stool should be soft and yellow.  At least 3 stools in a 24-hour period by the age of 7 days. The stool should be seedy and yellow.  No loss of weight greater than 10% of birth weight during the first 3 days of life.  Average weight gain of 4-7 oz (113-198 g) per week after the age of 4 days.  Consistent daily weight gain by the age of 5 days, without weight loss after the age of 2 weeks. After a feeding, your baby may spit up a small amount of milk. This is normal. Breastfeeding frequency and duration Frequent feeding will help you make more milk and can prevent sore nipples and extremely full breasts (breast engorgement). Breastfeed when you feel the need to reduce the fullness of your breasts or when your baby shows signs of hunger. This is called "breastfeeding on demand." Signs that your baby is hungry include:  Increased alertness, activity, or restlessness.  Movement of the head from side to side.  Opening of the mouth when the corner of the mouth or cheek is stroked (rooting).  Increased sucking sounds, smacking lips, cooing, sighing, or squeaking.  Hand-to-mouth movements and sucking on fingers or hands.  Fussing or crying. Avoid introducing a pacifier to your baby in the first 4-6 weeks after your baby is born. After this time, you may choose to use a pacifier. Research has shown that pacifier use during the first year of a baby's life decreases the risk of sudden infant death syndrome (SIDS). Allow your baby to feed on each breast as long as he or she wants. When your baby unlatches or falls asleep while feeding from the first breast, offer the second breast. Because newborns are often sleepy in the first few weeks of life, you may need to awaken your baby to get him or her to feed. Breastfeeding times  will vary from baby to baby. However, the following rules can serve as a guide to help you make sure that your baby is properly fed:  Newborns (babies 4 weeks of  age or younger) may breastfeed every 1-3 hours.  Newborns should not go without breastfeeding for longer than 3 hours during the day or 5 hours during the night.  You should breastfeed your baby a minimum of 8 times in a 24-hour period. Breast milk pumping     Pumping and storing breast milk allows you to make sure that your baby is exclusively fed your breast milk, even at times when you are unable to breastfeed. This is especially important if you go back to work while you are still breastfeeding, or if you are not able to be present during feedings. Your lactation consultant can help you find a method of pumping that works best for you and give you guidelines about how long it is safe to store breast milk. Caring for your breasts while you breastfeed Nipples can become dry, cracked, and sore while breastfeeding. The following recommendations can help keep your breasts moisturized and healthy:  Avoid using soap on your nipples.  Wear a supportive bra designed especially for nursing. Avoid wearing underwire-style bras or extremely tight bras (sports bras).  Air-dry your nipples for 3-4 minutes after each feeding.  Use only cotton bra pads to absorb leaked breast milk. Leaking of breast milk between feedings is normal.  Use lanolin on your nipples after breastfeeding. Lanolin helps to maintain your skin's normal moisture barrier. Pure lanolin is not harmful (not toxic) to your baby. You may also hand express a few drops of breast milk and gently massage that milk into your nipples and allow the milk to air-dry. In the first few weeks after giving birth, some women experience breast engorgement. Engorgement can make your breasts feel heavy, warm, and tender to the touch. Engorgement peaks within 3-5 days after you give birth. The  following recommendations can help to ease engorgement:  Completely empty your breasts while breastfeeding or pumping. You may want to start by applying warm, moist heat (in the shower or with warm, water-soaked hand towels) just before feeding or pumping. This increases circulation and helps the milk flow. If your baby does not completely empty your breasts while breastfeeding, pump any extra milk after he or she is finished.  Apply ice packs to your breasts immediately after breastfeeding or pumping, unless this is too uncomfortable for you. To do this: ? Put ice in a plastic bag. ? Place a towel between your skin and the bag. ? Leave the ice on for 20 minutes, 2-3 times a day.  Make sure that your baby is latched on and positioned properly while breastfeeding. If engorgement persists after 48 hours of following these recommendations, contact your health care provider or a Science writer. Overall health care recommendations while breastfeeding  Eat 3 healthy meals and 3 snacks every day. Well-nourished mothers who are breastfeeding need an additional 450-500 calories a day. You can meet this requirement by increasing the amount of a balanced diet that you eat.  Drink enough water to keep your urine pale yellow or clear.  Rest often, relax, and continue to take your prenatal vitamins to prevent fatigue, stress, and low vitamin and mineral levels in your body (nutrient deficiencies).  Do not use any products that contain nicotine or tobacco, such as cigarettes and e-cigarettes. Your baby may be harmed by chemicals from cigarettes that pass into breast milk and exposure to secondhand smoke. If you need help quitting, ask your health care provider.  Avoid alcohol.  Do not use illegal drugs or marijuana.  Talk with  your health care provider before taking any medicines. These include over-the-counter and prescription medicines as well as vitamins and herbal supplements. Some medicines that  may be harmful to your baby can pass through breast milk.  It is possible to become pregnant while breastfeeding. If birth control is desired, ask your health care provider about options that will be safe while breastfeeding your baby. Where to find more information: [employer]: www.llli.org Contact a health care provider if:  You feel like you want to stop breastfeeding or have become frustrated with breastfeeding.  Your nipples are cracked or bleeding.  Your breasts are red, tender, or warm.  You have: ? Painful breasts or nipples. ? A swollen area on either breast. ? A fever or chills. ? Nausea or vomiting. ? Drainage other than breast milk from your nipples.  Your breasts do not become full before feedings by the fifth day after you give birth.  You feel sad and depressed.  Your baby is: ? Too sleepy to eat well. ? Having trouble sleeping. ? More than 53 week old and wetting fewer than 6 diapers in a 24-hour period. ? Not gaining weight by 75 days of age.  Your baby has fewer than 3 stools in a 24-hour period.  Your baby's skin or the white parts of his or her eyes become yellow. Get help right away if:  Your baby is overly tired (lethargic) and does not want to wake up and feed.  Your baby develops an unexplained fever. Summary  Breastfeeding offers many health benefits for infant and mothers.  Try to breastfeed your infant when he or she shows early signs of hunger.  Gently tickle or stroke your baby's lips with your finger or nipple to allow the baby to open his or her mouth. Bring the baby to your breast. Make sure that much of the areola is in your baby's mouth. Offer one side and burp the baby before you offer the other side.  Talk with your health care provider or lactation consultant if you have questions or you face problems as you breastfeed. This information is not intended to replace advice given to you by your health care provider. Make  sure you discuss any questions you have with your health care provider. Document Revised: 03/13/2018 Document Reviewed: 01/18/2017 Elsevier Patient Education  [place].

## 2020-05-05 NOTE — Progress Notes (Signed)
ROB: Patient overall doing well, no major complaints.  For 28 week labs today.  Desires to breastfeed, desires Nexplanon for contraception. For Tdap today, signed blood consent. Desires circumcision, discussed procedure. Notes that she has been eating more ice, concerned about anemia. Checking labs today. Discussed labor plans, desires to try natural labor, discussed use of doula support. Needs form filled for school, including TB testing, will order.  RTC 2-3 weeks.   The patient has Medicaid.  [facility] Medicaid Risk Screening Form completed today.    The following were addressed during this visit:  Breastfeeding Education - Nonpharmacological pain relief methods for labor    Comments: Deep breathing, focusing on pleasant things, movement and walking, heating pads or cold compress, massage and relaxation, continuous support from someone you trust, and Doulas   - The importance of early skin-to-skin contact    Comments: Keeps baby warm and secure, helps keep baby's blood sugar up and breathing steady, easier to bond and breastfeed, and helps calm baby.  - Rooming-in on a 24-hour basis    Comments: Easier to learn baby's feeding cues, easier to bond and get to know each other, and encourages milk production.

## 2020-05-05 NOTE — Progress Notes (Signed)
ROB-Pt present for routine prenatal care and 28 week labs. tdap vaccine administered and BTC form signed. Pt stated that she was doing well no problems.

## 2020-05-06 LAB — CBC
Hematocrit: 33.9 % — ABNORMAL LOW (ref 34.0–46.6)
Hemoglobin: 10.6 g/dL — ABNORMAL LOW (ref 11.1–15.9)
MCH: 22.5 pg — ABNORMAL LOW (ref 26.6–33.0)
MCHC: 31.3 g/dL — ABNORMAL LOW (ref 31.5–35.7)
MCV: 72 fL — ABNORMAL LOW (ref 79–97)
Platelets: 202 10*3/uL (ref 150–450)
RBC: 4.71 x10E6/uL (ref 3.77–5.28)
RDW: 14 % (ref 11.7–15.4)
WBC: 9 10*3/uL (ref 3.4–10.8)

## 2020-05-06 LAB — GLUCOSE, 1 HOUR GESTATIONAL: Gestational Diabetes Screen: 116 mg/dL (ref 65–139)

## 2020-05-06 LAB — RPR: RPR Ser Ql: NONREACTIVE

## 2020-05-09 ENCOUNTER — Telehealth: Payer: Self-pay

## 2020-05-09 NOTE — Telephone Encounter (Signed)
Pt called operator stated that the person you are trying to reach is unavailable at this time. Will send pt a mychart message informing her physical examination form for [employer] has been completed and signed.

## 2020-05-19 ENCOUNTER — Encounter: Payer: Self-pay | Admitting: Obstetrics and Gynecology

## 2020-05-19 ENCOUNTER — Other Ambulatory Visit: Payer: Self-pay

## 2020-05-19 ENCOUNTER — Ambulatory Visit (INDEPENDENT_AMBULATORY_CARE_PROVIDER_SITE_OTHER): Payer: Managed Care, Other (non HMO) | Admitting: Obstetrics and Gynecology

## 2020-05-19 VITALS — BP 129/85 | HR 98 | Wt 163.0 lb

## 2020-05-19 DIAGNOSIS — Z3A29 29 weeks gestation of pregnancy: Secondary | ICD-10-CM

## 2020-05-19 DIAGNOSIS — Z3483 Encounter for supervision of other normal pregnancy, third trimester: Secondary | ICD-10-CM

## 2020-05-19 DIAGNOSIS — Z111 Encounter for screening for respiratory tuberculosis: Secondary | ICD-10-CM

## 2020-05-19 NOTE — Progress Notes (Signed)
ROB: Patient doing well without complaint.  Reports daily movement.  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  Reviewed 28-week lab work

## 2020-05-23 LAB — QUANTIFERON-TB GOLD PLUS
QuantiFERON Mitogen Value: 4.98 IU/mL
QuantiFERON Nil Value: 0.03 IU/mL
QuantiFERON TB1 Ag Value: 0 IU/mL
QuantiFERON TB2 Ag Value: 0 IU/mL
QuantiFERON-TB Gold Plus: NEGATIVE

## 2020-06-08 ENCOUNTER — Telehealth: Payer: Self-pay | Admitting: Obstetrics and Gynecology

## 2020-06-08 ENCOUNTER — Emergency Department: Payer: Managed Care, Other (non HMO)

## 2020-06-08 ENCOUNTER — Encounter: Payer: Self-pay | Admitting: Emergency Medicine

## 2020-06-08 ENCOUNTER — Emergency Department
Admission: EM | Admit: 2020-06-08 | Discharge: 2020-06-08 | Disposition: A | Discharge status: Home / Self Care | Payer: Managed Care, Other (non HMO) | Source: Home / Self Care | Attending: Emergency Medicine | Admitting: Emergency Medicine

## 2020-06-08 ENCOUNTER — Encounter: Payer: Self-pay | Admitting: Obstetrics and Gynecology

## 2020-06-08 ENCOUNTER — Observation Stay
Admission: EM | Admit: 2020-06-08 | Discharge: 2020-06-09 | Disposition: A | Discharge status: Home / Self Care | Payer: Managed Care, Other (non HMO) | Attending: Obstetrics and Gynecology | Admitting: Obstetrics and Gynecology

## 2020-06-08 ENCOUNTER — Other Ambulatory Visit: Payer: Self-pay

## 2020-06-08 DIAGNOSIS — O26893 Other specified pregnancy related conditions, third trimester: Secondary | ICD-10-CM | POA: Insufficient documentation

## 2020-06-08 DIAGNOSIS — Z3A32 32 weeks gestation of pregnancy: Secondary | ICD-10-CM | POA: Diagnosis not present

## 2020-06-08 DIAGNOSIS — Z20822 Contact with and (suspected) exposure to covid-19: Secondary | ICD-10-CM | POA: Insufficient documentation

## 2020-06-08 DIAGNOSIS — R109 Unspecified abdominal pain: Secondary | ICD-10-CM | POA: Insufficient documentation

## 2020-06-08 DIAGNOSIS — O368130 Decreased fetal movements, third trimester, not applicable or unspecified: Secondary | ICD-10-CM | POA: Diagnosis not present

## 2020-06-08 DIAGNOSIS — R55 Syncope and collapse: Secondary | ICD-10-CM | POA: Diagnosis not present

## 2020-06-08 DIAGNOSIS — O139 Gestational [pregnancy-induced] hypertension without significant proteinuria, unspecified trimester: Secondary | ICD-10-CM

## 2020-06-08 DIAGNOSIS — O10913 Unspecified pre-existing hypertension complicating pregnancy, third trimester: Secondary | ICD-10-CM | POA: Insufficient documentation

## 2020-06-08 DIAGNOSIS — O26899 Other specified pregnancy related conditions, unspecified trimester: Secondary | ICD-10-CM

## 2020-06-08 DIAGNOSIS — R000 Tachycardia, unspecified: Secondary | ICD-10-CM

## 2020-06-08 DIAGNOSIS — O368190 Decreased fetal movements, unspecified trimester, not applicable or unspecified: Secondary | ICD-10-CM | POA: Diagnosis present

## 2020-06-08 DIAGNOSIS — R519 Headache, unspecified: Secondary | ICD-10-CM

## 2020-06-08 LAB — URINALYSIS, COMPLETE (UACMP) WITH MICROSCOPIC
Bilirubin Urine: NEGATIVE
Glucose, UA: NEGATIVE mg/dL
Hgb urine dipstick: NEGATIVE
Ketones, ur: NEGATIVE mg/dL
Leukocytes,Ua: NEGATIVE
Nitrite: NEGATIVE
Protein, ur: 30 mg/dL — AB
Specific Gravity, Urine: 1.021 (ref 1.005–1.030)
pH: 6 (ref 5.0–8.0)

## 2020-06-08 LAB — CBC
HCT: 33.3 % — ABNORMAL LOW (ref 36.0–46.0)
Hemoglobin: 10.3 g/dL — ABNORMAL LOW (ref 12.0–15.0)
MCH: 22.2 pg — ABNORMAL LOW (ref 26.0–34.0)
MCHC: 30.9 g/dL (ref 30.0–36.0)
MCV: 71.6 fL — ABNORMAL LOW (ref 80.0–100.0)
Platelets: 167 10*3/uL (ref 150–400)
RBC: 4.65 MIL/uL (ref 3.87–5.11)
RDW: 14.9 % (ref 11.5–15.5)
WBC: 9.1 10*3/uL (ref 4.0–10.5)
nRBC: 0 % (ref 0.0–0.2)

## 2020-06-08 LAB — BASIC METABOLIC PANEL
Anion gap: 7 (ref 5–15)
BUN: 6 mg/dL (ref 6–20)
CO2: 21 mmol/L — ABNORMAL LOW (ref 22–32)
Calcium: 8.9 mg/dL (ref 8.9–10.3)
Chloride: 106 mmol/L (ref 98–111)
Creatinine, Ser: 0.72 mg/dL (ref 0.44–1.00)
GFR calc Af Amer: >60 mL/min (ref >60)
GFR calc non Af Amer: >60 mL/min (ref >60)
Glucose, Bld: 139 mg/dL — ABNORMAL HIGH (ref 70–99)
Potassium: 3.6 mmol/L (ref 3.5–5.1)
Sodium: 134 mmol/L — ABNORMAL LOW (ref 135–145)

## 2020-06-08 LAB — SARS CORONAVIRUS 2 BY RT PCR (HOSPITAL ORDER, PERFORMED IN ~~LOC~~ HOSPITAL LAB): SARS Coronavirus 2: NEGATIVE

## 2020-06-08 LAB — HEPATIC FUNCTION PANEL
ALT: 13 U/L (ref 0–44)
AST: 19 U/L (ref 15–41)
Albumin: 3.1 g/dL — ABNORMAL LOW (ref 3.5–5.0)
Alkaline Phosphatase: 135 U/L — ABNORMAL HIGH (ref 38–126)
Bilirubin, Direct: <0.1 mg/dL (ref 0.0–0.2)
Total Bilirubin: 0.5 mg/dL (ref 0.3–1.2)
Total Protein: 6.7 g/dL (ref 6.5–8.1)

## 2020-06-08 LAB — BRAIN NATRIURETIC PEPTIDE: B Natriuretic Peptide: 25.1 pg/mL (ref 0.0–100.0)

## 2020-06-08 LAB — TROPONIN I (HIGH SENSITIVITY): Troponin I (High Sensitivity): 4 ng/L (ref <18)

## 2020-06-08 LAB — PROTEIN / CREATININE RATIO, URINE
Creatinine, Urine: 65 mg/dL
Protein Creatinine Ratio: 0.23 mg/mg{Cre} — ABNORMAL HIGH (ref 0.00–0.15)
Total Protein, Urine: 15 mg/dL

## 2020-06-08 MED ORDER — ACETAMINOPHEN 500 MG PO TABS: 1000.0000 mg | ORAL_TABLET | Freq: Four times a day (QID) | ORAL | Status: DC | PRN

## 2020-06-08 MED ORDER — SODIUM CHLORIDE 0.9 % IV BOLUS
1000.0000 mL | Freq: Once | INTRAVENOUS | Status: AC
  Administered 2020-06-08: 1000 mL via INTRAVENOUS

## 2020-06-08 MED ORDER — ACETAMINOPHEN 500 MG PO TABS
ORAL_TABLET | ORAL | Status: AC
  Administered 2020-06-08: 1000 mg via ORAL
  Filled 2020-06-08: qty 2

## 2020-06-08 MED ORDER — IOHEXOL 350 MG/ML SOLN
75.0000 mL | Freq: Once | INTRAVENOUS | Status: AC | PRN
  Administered 2020-06-08: 75 mL via INTRAVENOUS

## 2020-06-08 NOTE — ED Provider Notes (Signed)
[facility] Emergency Department Provider Note  ____________________________________________   First MD Initiated Contact with Patient 06/08/20 1645     (approximate)  I have reviewed the triage vital signs and the nursing notes.   HISTORY  Chief Complaint Loss of Consciousness    HPI Michelle Thompson is a 24 y.o. female who is approximately [redacted] weeks pregnant who comes in for syncopal episode.  Patient states that she hit her head and now has a headache.  Patient that she is unconscious for about 2 minutes.  Patient reported having a blood pressure of 150/100-145/97.  Patient states that she did start having some shortness of breath that started yesterday, intermittent, worse with exertion, lying flat, nothing makes it better.  Denies having this in her prior pregnancy.  Denies any unilateral leg swelling.  She also reports prior to passing out she had a little bit of blurry vision and dizziness that lasted for a few seconds and then she passed out.  She no longer has any blurry vision.  She did hit the back of her head.  Denies any C-spine tenderness, numbness or tingling her arms or legs.  She does report some baseline abdominal cramping.  She denies any vaginal loss of fluids or vaginal bleeding.  Her prior pregnancy had no complications and she delivered at 40 weeks.  She denies falling onto her abdomen when she passed out.  She fell backwards.          Past Medical History:  Diagnosis Date  . Hypertension     There are no problems to display for this patient.   Past Surgical History:  Procedure Laterality Date  . NO PAST SURGERIES      Prior to Admission medications   Medication Sig Start Date End Date Taking? Authorizing Provider  Prenatal Vit-Fe Fumarate-FA (PRENATAL MULTIVITAMIN) TABS tablet Take 1 tablet by mouth daily at 12 noon.    [provider]    Allergies Motrin [ibuprofen]  Family History  Problem Relation Age of Onset    . Lupus Mother   . Healthy Father   . Asthma Brother   . Diabetes Maternal Grandmother   . COPD Maternal Grandmother   . Diabetes Maternal Grandfather   . Stroke Maternal Grandfather     Social History Social History   Tobacco Use  . Smoking status: Never Smoker  . Smokeless tobacco: Never Used  Substance Use Topics  . Alcohol use: No  . Drug use: No      Review of Systems Constitutional: No fever/chills Eyes: No visual changes. ENT: No sore throat. Cardiovascular: Denies chest pain.  Positive syncope Respiratory: Positive shortness of breath Gastrointestinal: No abdominal pain.  No nausea, no vomiting.  No diarrhea.  No constipation. Genitourinary: Negative for dysuria. Musculoskeletal: Negative for back pain. Skin: Negative for rash. Neurological: Positive headache, focal weakness or numbness. All other ROS negative ____________________________________________   PHYSICAL EXAM:  VITAL SIGNS: ED Triage Vitals  Enc Vitals Group     BP 06/08/20 1600 136/84     Pulse Rate 06/08/20 1600 (!) 121     Resp 06/08/20 1600 18     Temp 06/08/20 1600 98.7 F (37.1 C)     Temp Source 06/08/20 1600 Oral     SpO2 06/08/20 1600 99 %     Weight 06/08/20 1555 168 lb (76.2 kg)     Height 06/08/20 1555 5\' 2"  (1.575 m)     Head Circumference --  Peak Flow --      Pain Score 06/08/20 1555 5     Pain Loc --      Pain Edu? --      Excl. in GC? --     Constitutional: Alert and oriented. Well appearing and in no acute distress. Eyes: Conjunctivae are normal. EOMI. Head: Atraumatic. Nose: No congestion/rhinnorhea. Mouth/Throat: Mucous membranes are moist.   Neck: No stridor. Trachea Midline. FROM Cardiovascular: Tachycardic, regular rhythm. Grossly normal heart sounds.  Good peripheral circulation. Respiratory: Normal respiratory effort.  No retractions. Lungs CTAB. Gastrointestinal: Soft and nontender. No distention. No abdominal bruits.  Musculoskeletal: No lower  extremity tenderness nor edema.  No joint effusions. Neurologic:  Normal speech and language. No gross focal neurologic deficits are appreciated.  Skin:  Skin is warm, dry and intact. No rash noted. Psychiatric: Mood and affect are normal. Speech and behavior are normal. GU: Deferred   ____________________________________________   LABS (all labs ordered are listed, but only abnormal results are displayed)  Labs Reviewed  BASIC METABOLIC PANEL - Abnormal; Notable for the following components:      Result Value   Sodium 134 (*)    CO2 21 (*)    Glucose, Bld 139 (*)    All other components within normal limits  CBC - Abnormal; Notable for the following components:   Hemoglobin 10.3 (*)    HCT 33.3 (*)    MCV 71.6 (*)    MCH 22.2 (*)    All other components within normal limits  URINALYSIS, COMPLETE (UACMP) WITH MICROSCOPIC - Abnormal; Notable for the following components:   Color, Urine YELLOW (*)    APPearance HAZY (*)    Protein, ur 30 (*)    Bacteria, UA RARE (*)    All other components within normal limits  HEPATIC FUNCTION PANEL - Abnormal; Notable for the following components:   Albumin 3.1 (*)    Alkaline Phosphatase 135 (*)    All other components within normal limits  SARS CORONAVIRUS 2 BY RT PCR (HOSPITAL ORDER, PERFORMED IN [facility])  BRAIN NATRIURETIC PEPTIDE  CBG MONITORING, ED  TROPONIN I (HIGH SENSITIVITY)   ____________________________________________   ED ECG REPORT I, Concha Se, the attending physician, personally viewed and interpreted this ECG.  EKG is sinus tachycardia rate of 121, no ST elevation but does have T wave inversions in lead III, V2, V3, normal intervals.  Does have an S1 q 3 T3  Repeat EKG sinus tachycardia rate of 103, no ST elevations, continue T wave inversion in lead III and V2 and V3, normal intervals ____________________________________________  RADIOLOGY   Official radiology report(s): CT Head Wo  Contrast  Result Date: 06/08/2020 CLINICAL DATA:  Syncopal episode.  Fell and hit head.  Headache. EXAM: CT HEAD WITHOUT CONTRAST TECHNIQUE: Contiguous axial images were obtained from the base of the skull through the vertex without intravenous contrast. COMPARISON:  None. FINDINGS: Brain: The ventricles are normal in size and configuration. No extra-axial fluid collections are identified. The gray-white differentiation is maintained. No CT findings for acute hemispheric infarction or intracranial hemorrhage. No mass lesions. The brainstem and cerebellum are normal. Vascular: No hyperdense vessels or obvious aneurysm. Skull: No acute skull fracture.  No bone lesion. Sinuses/Orbits: The paranasal sinuses and mastoid air cells are clear. The globes are intact. Other: No scalp lesions, laceration or hematoma. IMPRESSION: Normal head CT. Electronically Signed   By: Rudie Meyer M.D.   On: 06/08/2020 18:25   CT  Angio Chest PE W and/or Wo Contrast  Result Date: 06/08/2020 CLINICAL DATA:  Syncopal episode.  Shortness of breath. EXAM: CT ANGIOGRAPHY CHEST WITH CONTRAST TECHNIQUE: Multidetector CT imaging of the chest was performed using the standard protocol during bolus administration of intravenous contrast. Multiplanar CT image reconstructions and MIPs were obtained to evaluate the vascular anatomy. CONTRAST:  19mL OMNIPAQUE IOHEXOL 350 MG/ML SOLN COMPARISON:  None. FINDINGS: Cardiovascular: The heart is normal in size. No pericardial effusion. The aorta is normal in caliber. No dissection. The pulmonary arteries are well opacified. No filling defects to suggest pulmonary embolism. Mediastinum/Nodes: No mediastinal or hilar mass or adenopathy. Lungs/Pleura: Breathing motion artifact but no infiltrates, edema or effusions. No pleural effusion or pneumothorax. Upper Abdomen: No significant upper abdominal findings. Stomach is full of food. Musculoskeletal: No breast masses, supraclavicular or axillary adenopathy. The  bony thorax is intact. No rib or sternal or vertebral body fractures are identified. Review of the MIP images confirms the above findings. IMPRESSION: 1. No CT findings for pulmonary embolism. 2. Normal thoracic aorta. 3. No acute pulmonary findings. Electronically Signed   By: Rudie Meyer M.D.   On: 06/08/2020 18:30    ____________________________________________   PROCEDURES  Procedure(s) performed (including Critical Care):  Procedures   ____________________________________________   INITIAL IMPRESSION / ASSESSMENT AND PLAN / ED COURSE  Michelle Thompson was evaluated in Emergency Department on 06/08/2020 for the symptoms described in the history of present illness. She was evaluated in the context of the global COVID-19 pandemic, which necessitated consideration that the patient might be at risk for infection with the SARS-CoV-2 virus that causes COVID-19. Institutional protocols and algorithms that pertain to the evaluation of patients at risk for COVID-19 are in a state of rapid change based on information released by regulatory bodies including the CDC and federal and state organizations. These policies and algorithms were followed during the patient's care in the ED.    Patient is a 24 year old who is [redacted] weeks pregnant who comes in with shortness of breath, tachycardia and syncope.  This is most concerning for the possibility of pulmonary embolism.  We discussed the pros and cons of CT imaging but given her high concern patient is willing to proceed with CT.  No unilateral leg swelling to suggest DVT and D-dimer would not be effective in this case due to patient being pregnant.  Also get labs to evaluate for heart failure although she is got no obvious swelling in her legs to suggest this.  Will get CT head due to patient hitting her head just to make sure there is no signs of intracranial hemorrhage in case we need to anticoagulate patient.  Patient denies any current blurry vision and the  blurry vision was only for a few seconds prior to passing out to suggest caverous venous thrombosis, extraocular movements are intact.  Denies any symptoms of Covid but will get testing.  She denies any shaking, tongue biting, urinary incontinence to suggest seizure.  No symptoms of active labor at this time we will continue to closely monitor I discussed with the OB/GYN team once medically cleared.  Should not fall onto her belly.  She fell onto her back.  Denies any vaginal bleeding.   Hemoglobin is at baseline.  Labs are reassuring.  Urine without evidence of UTI.  Cardiac markers are negative and symptoms started greater than 3 hours ago.  And BNP is negative.   CT PE is negative and CT head are negative.  D/w Dr.  Cherry-she stated that if patient felt that there was less fetal movement that that she can go upstairs to be evaluated after patient received fluids.  However after patient received fluids if she started feeling fetal movement better that she would feel comfortable with patient being discharged from the ER.  After the fluids patient heart rate has come down to the 90s.  Repeat EKG is continued to be stable.  Discussed with Dr. Valentino Saxon patient's highest blood pressure was 136/94 with 30 protein in her urine.  Does not have greater then 4 hours apart with elevated blood pressures to suggest a diagnosis of preeclampsia.  Discussed with Dr. Valentino Saxon and she stated that they can monitor her upstairs.  Dr. Valentino Saxon recommends we send patient upstairs to have fetal monitor done as well given decreased fetal movement per patient.  Patient's oxygen levels have remained normal, heart rate has trended down.  Continues to look well has no further episodes of syncopal issues.  Work-up has been reassuring.  This time patient is medically cleared.  Discussed with our charge nurse and the OB team was also we will discharge her to then go upstairs    ____________________________________________   FINAL  CLINICAL IMPRESSION(S) / ED DIAGNOSES   Final diagnoses:  Syncope and collapse  Tachycardia      MEDICATIONS GIVEN DURING THIS VISIT:  Medications  iohexol (OMNIPAQUE) 350 MG/ML injection 75 mL (75 mLs Intravenous Contrast Given 06/08/20 1744)  sodium chloride 0.9 % bolus 1,000 mL (1,000 mLs Intravenous New Bag/Given 06/08/20 1953)     ED Discharge Orders    None       Note:  This document was prepared using Dragon voice recognition software and may include unintentional dictation errors.   Concha Se, MD 06/08/20 2106

## 2020-06-08 NOTE — ED Notes (Signed)
Patient c/o pain at IV site. Site flushes easily without swelling. Patient requests that IV be removed. Dr. Fuller Plan aware.

## 2020-06-08 NOTE — Telephone Encounter (Signed)
Spoke with patient and she stated that she passed out today. Patient was standing talking to someone and just blacked out. She states that she fell on her back and hit her head. Patient denies any tenderness at the location where she hit her head. She said that she does have a headache. She has taken her BP several times since she blacked out and it ranges from 150/100 to 145/97. Patient states that she started having SOB last night. Denies chest pain. She is having some blurred vision and seeing spots. She said she is having more of the blurred vision. Patient said that she has been feeling the baby move just not as much. Told patient I would check with Dr. Valentino Saxon to see what she would like her to do.

## 2020-06-08 NOTE — Telephone Encounter (Addendum)
Spoke with patient and let her know that she needs to go to ED per Dr. Valentino Saxon since she hit her head.

## 2020-06-08 NOTE — Telephone Encounter (Signed)
Patient called in saying she passed out today and that her BP was 150/100 and her pulse was 120. Could you please advise? Patient stated she would like nurse to give her a call at the number [phone].

## 2020-06-08 NOTE — ED Notes (Signed)
Report given to [facility].

## 2020-06-08 NOTE — OB Triage Note (Signed)
Pt is a 24y/o G2P1 at [redacted]w[redacted]d with c/o decreased fetal movement noted after 1pm. Pt denies LOF, CTX and VB. Pt was seen in the ED for a syncope episode at 1pm and was cleared medically. Monitors applied and assessing. Initial FHT 150.

## 2020-06-08 NOTE — Final Progress Note (Signed)
L&D OB Triage Note  Michelle Thompson is a 24 y.o. G2P1001 female at [redacted]w[redacted]d, EDD Estimated Date of Delivery: 08/02/20 who presented to triage from the Emergency Room for complaints of decreased fetal movement.  She was evaluated in the Emergency Room for patient's complaints of shortness of breath, elevated blood pressures, syncopal episode yesterday with new onset of headache today. She was ruled out for PE, COVID infection and pre-eclampsia in the Emergency Room.  In L&D triage, she was evaluated by the nurses with no significant findings. Vital signs stable. An NST was performed and has been reviewed by MD.   NST INTERPRETATION: Indications: decreased fetal movement  Mode: External Baseline Rate (A): 140 bpm Variability: Moderate Accelerations: 15 x 15 Decelerations: None     Contraction Frequency (min): Rare; UI  Impression: reactive   Plan: NST performed was reviewed and was found to be reactive. She was discharged home with fetal kick count instructions.  Continue routine prenatal care. Follow up with OB/GYN as previously scheduled.     Hildred Laser, MD  [facility]

## 2020-06-08 NOTE — Discharge Instructions (Signed)
Hypertension During Pregnancy °High blood pressure (hypertension) is when the force of blood pumping through the arteries is too strong. Arteries are blood vessels that carry blood from the heart throughout the body. Hypertension during pregnancy can be mild or severe. Severe hypertension during pregnancy (preeclampsia) is a medical emergency that requires prompt evaluation and treatment. °Different types of hypertension can happen during pregnancy. These include: °· Chronic hypertension. This happens when you had high blood pressure before you became pregnant, and it continues during the pregnancy. Hypertension that develops before you are [redacted] weeks pregnant and continues during the pregnancy is also called chronic hypertension. If you have chronic hypertension, it will not go away after you have your baby. You will need follow-up visits with your health care provider after you have your baby. Your doctor may want you to keep taking medicine for your blood pressure. °· Gestational hypertension. This is hypertension that develops after the 20th week of pregnancy. Gestational hypertension usually goes away after you have your baby, but your health care provider will need to monitor your blood pressure to make sure that it is getting better. °· Preeclampsia. This is severe hypertension during pregnancy. This can cause serious complications for you and your baby and can also cause complications for you after the delivery of your baby. °· Postpartum preeclampsia. You may develop severe hypertension after giving birth. This usually occurs within 48 hours after childbirth but may occur up to 6 weeks after giving birth. This is rare. °How does this affect me? °Women who have hypertension during pregnancy have a greater chance of developing hypertension later in life or during future pregnancies. In some cases, hypertension during pregnancy can cause serious complications, such as: °· Stroke. °· Heart attack. °· Injury to  other organs, such as kidneys, lungs, or liver. °· Preeclampsia. °· Convulsions or seizures. °· Placental abruption. °How does this affect my baby? °Hypertension during pregnancy can affect your baby. Your baby may: °· Be born early (prematurely). °· Not weigh as much as he or she should at birth (low birth weight). °· Not tolerate labor well, leading to an unplanned cesarean delivery. °What are the risks? °There are certain factors that make it more likely for you to develop hypertension during pregnancy. These include: °· Having hypertension during a previous pregnancy. °· Being overweight. °· Being age 35 or older. °· Being pregnant for the first time. °· Being pregnant with more than one baby. °· Becoming pregnant using fertilization methods, such as IVF (in vitro fertilization). °· Having other medical problems, such as diabetes, kidney disease, or lupus. °· Having a family history of hypertension. °What can I do to lower my risk? °The exact cause of hypertension during pregnancy is not known. You may be able to lower your risk by: °· Maintaining a healthy weight. °· Eating a healthy and balanced diet. °· Following your health care provider's instructions about treating any long-term conditions that you had before becoming pregnant. °It is very important to keep all of your prenatal care appointments. Your health care provider will check your blood pressure and make sure that your pregnancy is progressing as expected. If a problem is found, early treatment can prevent complications. °How is this treated? °Treatment for hypertension during pregnancy varies depending on the type of hypertension you have and how serious it is. °· If you were taking medicine for high blood pressure before you became pregnant, talk with your health care provider. You may need to change medicine during pregnancy because   some medicines, like ACE inhibitors, may not be considered safe for your baby. °· If you have gestational  hypertension, your health care provider may order medicine to treat this during pregnancy. °· If you are at risk for preeclampsia, your health care provider may recommend that you take a low-dose aspirin during your pregnancy. °· If you have severe hypertension, you may need to be hospitalized so you and your baby can be monitored closely. You may also need to be given medicine to lower your blood pressure. This medicine may be given by mouth or through an IV. °· In some cases, if your condition gets worse, you may need to deliver your baby early. °Follow these instructions at home: °Eating and drinking ° °· Drink enough fluid to keep your urine pale yellow. °· Avoid caffeine. °Lifestyle °· Do not use any products that contain nicotine or tobacco, such as cigarettes, e-cigarettes, and chewing tobacco. If you need help quitting, ask your health care provider. °· Do not use alcohol or drugs. °· Avoid stress as much as possible. °· Rest and get plenty of sleep. °· Regular exercise can help to reduce your blood pressure. Ask your health care provider what kinds of exercise are best for you. °General instructions °· Take over-the-counter and prescription medicines only as told by your health care provider. °· Keep all prenatal and follow-up visits as told by your health care provider. This is important. °Contact a health care provider if: °· You have symptoms that your health care provider told you may require more treatment or monitoring, such as: °? Headaches. °? Nausea or vomiting. °? Abdominal pain. °? Dizziness. °? Light-headedness. °Get help right away if: °· You have: °? Severe abdominal pain that does not get better with treatment. °? A severe headache that does not get better. °? Vomiting that does not get better. °? Sudden, rapid weight gain. °? Sudden swelling in your hands, ankles, or face. °? Vaginal bleeding. °? Blood in your urine. °? Blurred or double vision. °? Shortness of breath or chest  pain. °? Weakness on one side of your body. °? Difficulty speaking. °· Your baby is not moving as much as usual. °Summary °· High blood pressure (hypertension) is when the force of blood pumping through the arteries is too strong. °· Hypertension during pregnancy can cause problems for you and your baby. °· Treatment for hypertension during pregnancy varies depending on the type of hypertension you have and how serious it is. °· Keep all prenatal and follow-up visits as told by your health care provider. This is important. °This information is not intended to replace advice given to you by your health care provider. Make sure you discuss any questions you have with your health care provider. °Document Revised: 04/09/2019 Document Reviewed: 01/13/2019 °[employer] Patient Education © 2020 [employer] ° ° ° ° °Fetal Movement Counts °Patient Name: ________________________________________________ Patient Due Date: ____________________ °What is a fetal movement count? ° °A fetal movement count is the number of times that you feel your baby move during a certain amount of time. This may also be called a fetal kick count. A fetal movement count is recommended for every pregnant woman. You may be asked to start counting fetal movements as early as week 28 of your pregnancy. °Pay attention to when your baby is most active. You may notice your baby's sleep and wake cycles. You may also notice things that make your baby move more. You should do a fetal movement count: °· When   your baby is normally most active. °· At the same time each day. °A good time to count movements is while you are resting, after having something to eat and drink. °How do I count fetal movements? °1. Find a quiet, comfortable area. Sit, or lie down on your side. °2. Write down the date, the start time and stop time, and the number of movements that you felt between those two times. Take this information with you to your health care visits. °3. Write down  your start time when you feel the first movement. °4. Count kicks, flutters, swishes, rolls, and jabs. You should feel at least 10 movements. °5. You may stop counting after you have felt 10 movements, or if you have been counting for 2 hours. Write down the stop time. °6. If you do not feel 10 movements in 2 hours, contact your health care provider for further instructions. Your health care provider may want to do additional tests to assess your baby's well-being. °Contact a health care provider if: °· You feel fewer than 10 movements in 2 hours. °· Your baby is not moving like he or she usually does. °Date: ____________ Start time: ____________ Stop time: ____________ Movements: ____________ °Date: ____________ Start time: ____________ Stop time: ____________ Movements: ____________ °Date: ____________ Start time: ____________ Stop time: ____________ Movements: ____________ °Date: ____________ Start time: ____________ Stop time: ____________ Movements: ____________ °Date: ____________ Start time: ____________ Stop time: ____________ Movements: ____________ °Date: ____________ Start time: ____________ Stop time: ____________ Movements: ____________ °Date: ____________ Start time: ____________ Stop time: ____________ Movements: ____________ °Date: ____________ Start time: ____________ Stop time: ____________ Movements: ____________ °Date: ____________ Start time: ____________ Stop time: ____________ Movements: ____________ °This information is not intended to replace advice given to you by your health care provider. Make sure you discuss any questions you have with your health care provider. °Document Revised: 08/06/2019 Document Reviewed: 08/06/2019 °[employer] Patient Education © 2020 [employer] ° °

## 2020-06-08 NOTE — ED Triage Notes (Signed)
Patient presents to the ED post syncopal episode.  Patient states she hit her head and now has a headache.  Patient states she was unconscious for approx. 2 min.  Patient denies nausea and vomiting.  Patient reports some hypertension.

## 2020-06-08 NOTE — Discharge Instructions (Addendum)
Discharged to go upstairs to OB/GYN further monitoring of your blood pressure per Dr. Valentino Saxon as well as fetal heart rate monitoring due to the decreased fetal movement.  Return to ER for worsening shortness of breath, fevers, abdominal pain, passing out again or any other concerns  Her CT PE, CT head and labs were otherwise reassuring.

## 2020-06-08 NOTE — ED Notes (Signed)
Patient ambulated with steady gait to and from hallway bathroom. Patient is sitting upright with legs over the edge of the stretcher and denies dizziness or headache at this time.

## 2020-06-09 DIAGNOSIS — O133 Gestational [pregnancy-induced] hypertension without significant proteinuria, third trimester: Secondary | ICD-10-CM

## 2020-06-09 DIAGNOSIS — Z3A32 32 weeks gestation of pregnancy: Secondary | ICD-10-CM | POA: Diagnosis not present

## 2020-06-09 DIAGNOSIS — O368130 Decreased fetal movements, third trimester, not applicable or unspecified: Secondary | ICD-10-CM | POA: Diagnosis not present

## 2020-06-09 NOTE — Progress Notes (Signed)
Pt d/c home to self care. Pt given d/c instructions on when to seek medical care. Pt verbalized understanding.

## 2020-06-09 NOTE — Progress Notes (Signed)
ROB-Pt present for routine prenatal care. Pt stated having SOB and swelling in her hands.

## 2020-06-10 ENCOUNTER — Ambulatory Visit (INDEPENDENT_AMBULATORY_CARE_PROVIDER_SITE_OTHER): Payer: Managed Care, Other (non HMO) | Admitting: Obstetrics and Gynecology

## 2020-06-10 ENCOUNTER — Encounter: Payer: Self-pay | Admitting: Obstetrics and Gynecology

## 2020-06-10 ENCOUNTER — Other Ambulatory Visit: Payer: Self-pay

## 2020-06-10 VITALS — BP 125/79 | HR 93 | Wt 166.6 lb

## 2020-06-10 DIAGNOSIS — O1203 Gestational edema, third trimester: Secondary | ICD-10-CM

## 2020-06-10 DIAGNOSIS — Z3483 Encounter for supervision of other normal pregnancy, third trimester: Secondary | ICD-10-CM

## 2020-06-10 DIAGNOSIS — Z3A32 32 weeks gestation of pregnancy: Secondary | ICD-10-CM

## 2020-06-10 LAB — POCT URINALYSIS DIPSTICK OB
Bilirubin, UA: NEGATIVE
Blood, UA: NEGATIVE
Glucose, UA: NEGATIVE
Ketones, UA: NEGATIVE
Nitrite, UA: NEGATIVE
Spec Grav, UA: 1.02 (ref 1.010–1.025)
Urobilinogen, UA: 0.2 E.U./dL
pH, UA: 6 (ref 5.0–8.0)

## 2020-06-10 NOTE — Progress Notes (Signed)
ROB: Patient was seen in Emergency Room and L&D triage 2 days ago for complaints of SOB, elevated blood pressures, and had decreased fetal movement. Also noting nausea and vomiting after she got discharged from the hospital.  Felt a little better yesterday and steadily improving. Concerned about having to have a CT scan and radiation effects to the pregnancy. Discussed concerns, given reassurance. Notes swelling of hands and feet. Trace swelling in hands and feet. PIH labs negative 2 days ago, BP wnl today.  Discussed elevation of legs, compression wear as needed. RTC in 2 weeks.

## 2020-06-10 NOTE — Patient Instructions (Signed)
Third Trimester of Pregnancy  The third trimester is from week 28 through week 40 (months 7 through 9). This trimester is when your unborn baby (fetus) is growing very fast. At the end of the ninth month, the unborn baby is about 20 inches in length. It weighs about 6-10 pounds. Follow these instructions at home: Medicines  Take over-the-counter and prescription medicines only as told by your doctor. Some medicines are safe and some medicines are not safe during pregnancy.  Take a prenatal vitamin that contains at least 600 micrograms (mcg) of folic acid.  If you have trouble pooping (constipation), take medicine that will make your stool soft (stool softener) if your doctor approves. Eating and drinking   Eat regular, healthy meals.  Avoid raw meat and uncooked cheese.  If you get low calcium from the food you eat, talk to your doctor about taking a daily calcium supplement.  Eat four or five small meals rather than three large meals a day.  Avoid foods that are high in fat and sugars, such as fried and sweet foods.  To prevent constipation: ? Eat foods that are high in fiber, like fresh fruits and vegetables, whole grains, and beans. ? Drink enough fluids to keep your pee (urine) clear or pale yellow. Activity  Exercise only as told by your doctor. Stop exercising if you start to have cramps.  Avoid heavy lifting, wear low heels, and sit up straight.  Do not exercise if it is too hot, too humid, or if you are in a place of great height (high altitude).  You may continue to have sex unless your doctor tells you not to. Relieving pain and discomfort  Wear a good support bra if your breasts are tender.  Take frequent breaks and rest with your legs raised if you have leg cramps or low back pain.  Take warm water baths (sitz baths) to soothe pain or discomfort caused by hemorrhoids. Use hemorrhoid cream if your doctor approves.  If you develop puffy, bulging veins (varicose  veins) in your legs: ? Wear support hose or compression stockings as told by your doctor. ? Raise (elevate) your feet for 15 minutes, 3-4 times a day. ? Limit salt in your food. Safety  Wear your seat belt when driving.  Make a list of emergency phone numbers, including numbers for family, friends, the hospital, and police and fire departments. Preparing for your baby's arrival To prepare for the arrival of your baby:  Take prenatal classes.  Practice driving to the hospital.  Visit the hospital and tour the maternity area.  Talk to your work about taking leave once the baby comes.  Pack your hospital bag.  Prepare the baby's room.  Go to your doctor visits.  Buy a rear-facing car seat. Learn how to install it in your car. General instructions  Do not use hot tubs, steam rooms, or saunas.  Do not use any products that contain nicotine or tobacco, such as cigarettes and e-cigarettes. If you need help quitting, ask your doctor.  Do not drink alcohol.  Do not douche or use tampons or scented sanitary pads.  Do not cross your legs for long periods of time.  Do not travel for long distances unless you must. Only do so if your doctor says it is okay.  Visit your dentist if you have not gone during your pregnancy. Use a soft toothbrush to brush your teeth. Be gentle when you floss.  Avoid cat litter boxes and soil  used by cats. These carry germs that can cause birth defects in the baby and can cause a loss of your baby (miscarriage) or stillbirth.  Keep all your prenatal visits as told by your doctor. This is important. Contact a doctor if:  You are not sure if you are in labor or if your water has broken.  You are dizzy.  You have mild cramps or pressure in your lower belly.  You have a nagging pain in your belly area.  You continue to feel sick to your stomach, you throw up, or you have watery poop.  You have bad smelling fluid coming from your vagina.  You have  pain when you pee. Get help right away if:  You have a fever.  You are leaking fluid from your vagina.  You are spotting or bleeding from your vagina.  You have severe belly cramps or pain.  You lose or gain weight quickly.  You have trouble catching your breath and have chest pain.  You notice sudden or extreme puffiness (swelling) of your face, hands, ankles, feet, or legs.  You have not felt the baby move in over an hour.  You have severe headaches that do not go away with medicine.  You have trouble seeing.  You are leaking, or you are having a gush of fluid, from your vagina before you are 37 weeks.  You have regular belly spasms (contractions) before you are 37 weeks. Summary  The third trimester is from week 28 through week 40 (months 7 through 9). This time is when your unborn baby is growing very fast.  Follow your doctor's advice about medicine, food, and activity.  Get ready for the arrival of your baby by taking prenatal classes, getting all the baby items ready, preparing the baby's room, and visiting your doctor to be checked.  Get help right away if you are bleeding from your vagina, or you have chest pain and trouble catching your breath, or if you have not felt your baby move in over an hour. This information is not intended to replace advice given to you by your health care provider. Make sure you discuss any questions you have with your health care provider. Document Revised: 04/09/2019 Document Reviewed: 01/22/2017 Elsevier Patient Education  [place]. Common Medications Safe in Pregnancy  Acne:      Constipation:  Benzoyl Peroxide     Colace  Clindamycin      Dulcolax Suppository  Topica Erythromycin     Fibercon  Salicylic Acid      Metamucil         Miralax AVOID:        Senakot   Accutane    Cough:  Retin-A       Cough Drops  Tetracycline      Phenergan w/ Codeine if Rx  Minocycline      Robitussin (Plain &  DM)  Antibiotics:     Crabs/Lice:  Ceclor       RID  Cephalosporins    AVOID:  E-Mycins      Kwell  Keflex  Macrobid/Macrodantin   Diarrhea:  Penicillin      Kao-Pectate  Zithromax      Imodium AD         PUSH FLUIDS AVOID:       Cipro     Fever:  Tetracycline      Tylenol (Regular or Extra  Minocycline       Strength)  Levaquin  Extra Strength-Do not          Exceed 8 tabs/24 hrs Caffeine:        <200mg/day (equiv. To 1 cup of coffee or  approx. 3 12 oz sodas)         Gas: Cold/Hayfever:       Gas-X  Benadryl      Mylicon  Claritin       Phazyme  **Claritin-D        Chlor-Trimeton    Headaches:  Dimetapp      ASA-Free Excedrin  Drixoral-Non-Drowsy     Cold Compress  Mucinex (Guaifenasin)     Tylenol (Regular or Extra  Sudafed/Sudafed-12 Hour     Strength)  **Sudafed PE Pseudoephedrine   Tylenol Cold & Sinus     Vicks Vapor Rub  Zyrtec  **AVOID if Problems With Blood Pressure         Heartburn: Avoid lying down for at least 1 hour after meals  Aciphex      Maalox     Rash:  Milk of Magnesia     Benadryl    Mylanta       1% Hydrocortisone Cream  Pepcid  Pepcid Complete   Sleep Aids:  Prevacid      Ambien   Prilosec       Benadryl  Rolaids       Chamomile Tea  Tums (Limit 4/day)     Unisom  Zantac       Tylenol PM         Warm milk-add vanilla or  Hemorrhoids:       Sugar for taste  Anusol/Anusol H.C.  (RX: Analapram 2.5%)  Sugar Substitutes:  Hydrocortisone OTC     Ok in moderation  Preparation H      Tucks        Vaseline lotion applied to tissue with wiping    Herpes:     Throat:  Acyclovir      Oragel  Famvir  Valtrex     Vaccines:         Flu Shot Leg Cramps:       *Gardasil  Benadryl      Hepatitis A         Hepatitis B Nasal Spray:       Pneumovax  Saline Nasal Spray     Polio Booster         Tetanus Nausea:       Tuberculosis test or PPD  Vitamin B6 25 mg TID   AVOID:    Dramamine      *Gardasil  Emetrol       Live  Poliovirus  Ginger Root 250 mg QID    MMR (measles, mumps &  High Complex Carbs @ Bedtime    rebella)  Sea Bands-Accupressure    Varicella (Chickenpox)  Unisom 1/2 tab TID     *No known complications           If received before Pain:         Known pregnancy;   Darvocet       Resume series after  Lortab        Delivery  Percocet    Yeast:   Tramadol      Femstat  Tylenol 3      Gyne-lotrimin  Ultram       Monistat  Vicodin           MISC:           All Sunscreens           Hair Coloring/highlights          Insect Repellant's          (Including DEET)         Mystic Tans Breastfeeding  Choosing to breastfeed is one of the best decisions you can make for yourself and your baby. A change in hormones during pregnancy causes your breasts to make breast milk in your milk-producing glands. Hormones prevent breast milk from being released before your baby is born. They also prompt milk flow after birth. Once breastfeeding has begun, thoughts of your baby, as well as his or her sucking or crying, can stimulate the release of milk from your milk-producing glands. Benefits of breastfeeding Research shows that breastfeeding offers many health benefits for infants and mothers. It also offers a cost-free and convenient way to feed your baby. For your baby  Your first milk (colostrum) helps your baby's digestive system to function better.  Special cells in your milk (antibodies) help your baby to fight off infections.  Breastfed babies are less likely to develop asthma, allergies, obesity, or type 2 diabetes. They are also at lower risk for sudden infant death syndrome (SIDS).  Nutrients in breast milk are better able to meet your baby's needs compared to infant formula.  Breast milk improves your baby's brain development. For you  Breastfeeding helps to create a very special bond between you and your baby.  Breastfeeding is convenient. Breast milk costs nothing and is always available at the  correct temperature.  Breastfeeding helps to burn calories. It helps you to lose the weight that you gained during pregnancy.  Breastfeeding makes your uterus return faster to its size before pregnancy. It also slows bleeding (lochia) after you give birth.  Breastfeeding helps to lower your risk of developing type 2 diabetes, osteoporosis, rheumatoid arthritis, cardiovascular disease, and breast, ovarian, uterine, and endometrial cancer later in life. Breastfeeding basics Starting breastfeeding  Find a comfortable place to sit or lie down, with your neck and back well-supported.  Place a pillow or a rolled-up blanket under your baby to bring him or her to the level of your breast (if you are seated). Nursing pillows are specially designed to help support your arms and your baby while you breastfeed.  Make sure that your baby's tummy (abdomen) is facing your abdomen.  Gently massage your breast. With your fingertips, massage from the outer edges of your breast inward toward the nipple. This encourages milk flow. If your milk flows slowly, you may need to continue this action during the feeding.  Support your breast with 4 fingers underneath and your thumb above your nipple (make the letter "C" with your hand). Make sure your fingers are well away from your nipple and your baby's mouth.  Stroke your baby's lips gently with your finger or nipple.  When your baby's mouth is open wide enough, quickly bring your baby to your breast, placing your entire nipple and as much of the areola as possible into your baby's mouth. The areola is the colored area around your nipple. ? More areola should be visible above your baby's upper lip than below the lower lip. ? Your baby's lips should be opened and extended outward (flanged) to ensure an adequate, comfortable latch. ? Your baby's tongue should be between his or her lower gum and your breast.  Make sure that your baby's mouth is correctly positioned  around your nipple (latched). Your  baby's lips should create a seal on your breast and be turned out (everted).  It is common for your baby to suck about 2-3 minutes in order to start the flow of breast milk. Latching Teaching your baby how to latch onto your breast properly is very important. An improper latch can cause nipple pain, decreased milk supply, and poor weight gain in your baby. Also, if your baby is not latched onto your nipple properly, he or she may swallow some air during feeding. This can make your baby fussy. Burping your baby when you switch breasts during the feeding can help to get rid of the air. However, teaching your baby to latch on properly is still the best way to prevent fussiness from swallowing air while breastfeeding. Signs that your baby has successfully latched onto your nipple  Silent tugging or silent sucking, without causing you pain. Infant's lips should be extended outward (flanged).  Swallowing heard between every 3-4 sucks once your milk has started to flow (after your let-down milk reflex occurs).  Muscle movement above and in front of his or her ears while sucking. Signs that your baby has not successfully latched onto your nipple  Sucking sounds or smacking sounds from your baby while breastfeeding.  Nipple pain. If you think your baby has not latched on correctly, slip your finger into the corner of your baby's mouth to break the suction and place it between your baby's gums. Attempt to start breastfeeding again. Signs of successful breastfeeding Signs from your baby  Your baby will gradually decrease the number of sucks or will completely stop sucking.  Your baby will fall asleep.  Your baby's body will relax.  Your baby will retain a small amount of milk in his or her mouth.  Your baby will let go of your breast by himself or herself. Signs from you  Breasts that have increased in firmness, weight, and size 1-3 hours after  feeding.  Breasts that are softer immediately after breastfeeding.  Increased milk volume, as well as a change in milk consistency and color by the fifth day of breastfeeding.  Nipples that are not sore, cracked, or bleeding. Signs that your baby is getting enough milk  Wetting at least 1-2 diapers during the first 24 hours after birth.  Wetting at least 5-6 diapers every 24 hours for the first week after birth. The urine should be clear or pale yellow by the age of 5 days.  Wetting 6-8 diapers every 24 hours as your baby continues to grow and develop.  At least 3 stools in a 24-hour period by the age of 5 days. The stool should be soft and yellow.  At least 3 stools in a 24-hour period by the age of 7 days. The stool should be seedy and yellow.  No loss of weight greater than 10% of birth weight during the first 3 days of life.  Average weight gain of 4-7 oz (113-198 g) per week after the age of 4 days.  Consistent daily weight gain by the age of 5 days, without weight loss after the age of 2 weeks. After a feeding, your baby may spit up a small amount of milk. This is normal. Breastfeeding frequency and duration Frequent feeding will help you make more milk and can prevent sore nipples and extremely full breasts (breast engorgement). Breastfeed when you feel the need to reduce the fullness of your breasts or when your baby shows signs of hunger. This is called "breastfeeding on   demand." Signs that your baby is hungry include:  Increased alertness, activity, or restlessness.  Movement of the head from side to side.  Opening of the mouth when the corner of the mouth or cheek is stroked (rooting).  Increased sucking sounds, smacking lips, cooing, sighing, or squeaking.  Hand-to-mouth movements and sucking on fingers or hands.  Fussing or crying. Avoid introducing a pacifier to your baby in the first 4-6 weeks after your baby is born. After this time, you may choose to use a  pacifier. Research has shown that pacifier use during the first year of a baby's life decreases the risk of sudden infant death syndrome (SIDS). Allow your baby to feed on each breast as long as he or she wants. When your baby unlatches or falls asleep while feeding from the first breast, offer the second breast. Because newborns are often sleepy in the first few weeks of life, you may need to awaken your baby to get him or her to feed. Breastfeeding times will vary from baby to baby. However, the following rules can serve as a guide to help you make sure that your baby is properly fed:  Newborns (babies 4 weeks of age or younger) may breastfeed every 1-3 hours.  Newborns should not go without breastfeeding for longer than 3 hours during the day or 5 hours during the night.  You should breastfeed your baby a minimum of 8 times in a 24-hour period. Breast milk pumping     Pumping and storing breast milk allows you to make sure that your baby is exclusively fed your breast milk, even at times when you are unable to breastfeed. This is especially important if you go back to work while you are still breastfeeding, or if you are not able to be present during feedings. Your lactation consultant can help you find a method of pumping that works best for you and give you guidelines about how long it is safe to store breast milk. Caring for your breasts while you breastfeed Nipples can become dry, cracked, and sore while breastfeeding. The following recommendations can help keep your breasts moisturized and healthy:  Avoid using soap on your nipples.  Wear a supportive bra designed especially for nursing. Avoid wearing underwire-style bras or extremely tight bras (sports bras).  Air-dry your nipples for 3-4 minutes after each feeding.  Use only cotton bra pads to absorb leaked breast milk. Leaking of breast milk between feedings is normal.  Use lanolin on your nipples after breastfeeding. Lanolin  helps to maintain your skin's normal moisture barrier. Pure lanolin is not harmful (not toxic) to your baby. You may also hand express a few drops of breast milk and gently massage that milk into your nipples and allow the milk to air-dry. In the first few weeks after giving birth, some women experience breast engorgement. Engorgement can make your breasts feel heavy, warm, and tender to the touch. Engorgement peaks within 3-5 days after you give birth. The following recommendations can help to ease engorgement:  Completely empty your breasts while breastfeeding or pumping. You may want to start by applying warm, moist heat (in the shower or with warm, water-soaked hand towels) just before feeding or pumping. This increases circulation and helps the milk flow. If your baby does not completely empty your breasts while breastfeeding, pump any extra milk after he or she is finished.  Apply ice packs to your breasts immediately after breastfeeding or pumping, unless this is too uncomfortable for you. To   do this: ? Put ice in a plastic bag. ? Place a towel between your skin and the bag. ? Leave the ice on for 20 minutes, 2-3 times a day.  Make sure that your baby is latched on and positioned properly while breastfeeding. If engorgement persists after 48 hours of following these recommendations, contact your health care provider or a Science writer. Overall health care recommendations while breastfeeding  Eat 3 healthy meals and 3 snacks every day. Well-nourished mothers who are breastfeeding need an additional 450-500 calories a day. You can meet this requirement by increasing the amount of a balanced diet that you eat.  Drink enough water to keep your urine pale yellow or clear.  Rest often, relax, and continue to take your prenatal vitamins to prevent fatigue, stress, and low vitamin and mineral levels in your body (nutrient deficiencies).  Do not use any products that contain nicotine or  tobacco, such as cigarettes and e-cigarettes. Your baby may be harmed by chemicals from cigarettes that pass into breast milk and exposure to secondhand smoke. If you need help quitting, ask your health care provider.  Avoid alcohol.  Do not use illegal drugs or marijuana.  Talk with your health care provider before taking any medicines. These include over-the-counter and prescription medicines as well as vitamins and herbal supplements. Some medicines that may be harmful to your baby can pass through breast milk.  It is possible to become pregnant while breastfeeding. If birth control is desired, ask your health care provider about options that will be safe while breastfeeding your baby. Where to find more information: [employer]: www.llli.org Contact a health care provider if:  You feel like you want to stop breastfeeding or have become frustrated with breastfeeding.  Your nipples are cracked or bleeding.  Your breasts are red, tender, or warm.  You have: ? Painful breasts or nipples. ? A swollen area on either breast. ? A fever or chills. ? Nausea or vomiting. ? Drainage other than breast milk from your nipples.  Your breasts do not become full before feedings by the fifth day after you give birth.  You feel sad and depressed.  Your baby is: ? Too sleepy to eat well. ? Having trouble sleeping. ? More than 3 week old and wetting fewer than 6 diapers in a 24-hour period. ? Not gaining weight by 61 days of age.  Your baby has fewer than 3 stools in a 24-hour period.  Your baby's skin or the white parts of his or her eyes become yellow. Get help right away if:  Your baby is overly tired (lethargic) and does not want to wake up and feed.  Your baby develops an unexplained fever. Summary  Breastfeeding offers many health benefits for infant and mothers.  Try to breastfeed your infant when he or she shows early signs of hunger.  Gently tickle or stroke  your baby's lips with your finger or nipple to allow the baby to open his or her mouth. Bring the baby to your breast. Make sure that much of the areola is in your baby's mouth. Offer one side and burp the baby before you offer the other side.  Talk with your health care provider or lactation consultant if you have questions or you face problems as you breastfeed. This information is not intended to replace advice given to you by your health care provider. Make sure you discuss any questions you have with your health care provider. Document Revised: 03/13/2018  Document Reviewed: 01/18/2017 Elsevier Patient Education  [employer].

## 2020-06-22 ENCOUNTER — Inpatient Hospital Stay (HOSPITAL_COMMUNITY)
Admission: AD | Admit: 2020-06-22 | Discharge: 2020-06-23 | Disposition: A | Discharge status: Home / Self Care | Payer: Managed Care, Other (non HMO) | Attending: Obstetrics and Gynecology | Admitting: Obstetrics and Gynecology

## 2020-06-22 ENCOUNTER — Encounter (HOSPITAL_COMMUNITY): Payer: Self-pay | Admitting: Obstetrics and Gynecology

## 2020-06-22 ENCOUNTER — Telehealth: Payer: Self-pay | Admitting: Obstetrics and Gynecology

## 2020-06-22 ENCOUNTER — Other Ambulatory Visit: Payer: Self-pay

## 2020-06-22 DIAGNOSIS — Z823 Family history of stroke: Secondary | ICD-10-CM | POA: Diagnosis not present

## 2020-06-22 DIAGNOSIS — O26893 Other specified pregnancy related conditions, third trimester: Secondary | ICD-10-CM | POA: Diagnosis not present

## 2020-06-22 DIAGNOSIS — O4703 False labor before 37 completed weeks of gestation, third trimester: Secondary | ICD-10-CM | POA: Insufficient documentation

## 2020-06-22 DIAGNOSIS — O10913 Unspecified pre-existing hypertension complicating pregnancy, third trimester: Secondary | ICD-10-CM | POA: Insufficient documentation

## 2020-06-22 DIAGNOSIS — R109 Unspecified abdominal pain: Secondary | ICD-10-CM | POA: Diagnosis present

## 2020-06-22 DIAGNOSIS — R519 Headache, unspecified: Secondary | ICD-10-CM | POA: Insufficient documentation

## 2020-06-22 DIAGNOSIS — Z833 Family history of diabetes mellitus: Secondary | ICD-10-CM | POA: Diagnosis not present

## 2020-06-22 DIAGNOSIS — O4700 False labor before 37 completed weeks of gestation, unspecified trimester: Secondary | ICD-10-CM

## 2020-06-22 DIAGNOSIS — Z825 Family history of asthma and other chronic lower respiratory diseases: Secondary | ICD-10-CM | POA: Diagnosis not present

## 2020-06-22 DIAGNOSIS — Z886 Allergy status to analgesic agent status: Secondary | ICD-10-CM | POA: Diagnosis not present

## 2020-06-22 DIAGNOSIS — Z3A34 34 weeks gestation of pregnancy: Secondary | ICD-10-CM | POA: Diagnosis not present

## 2020-06-22 DIAGNOSIS — O10919 Unspecified pre-existing hypertension complicating pregnancy, unspecified trimester: Secondary | ICD-10-CM | POA: Diagnosis present

## 2020-06-22 MED ORDER — HYOSCYAMINE SULFATE 0.125 MG SL SUBL
0.1250 mg | SUBLINGUAL_TABLET | Freq: Once | SUBLINGUAL | Status: AC
  Administered 2020-06-22: 0.125 mg via SUBLINGUAL
  Filled 2020-06-22: qty 1

## 2020-06-22 MED ORDER — SIMETHICONE 80 MG PO CHEW
80.0000 mg | CHEWABLE_TABLET | Freq: Four times a day (QID) | ORAL | Status: DC | PRN
  Administered 2020-06-22: 80 mg via ORAL
  Filled 2020-06-22: qty 1

## 2020-06-22 MED ORDER — LACTATED RINGERS IV SOLN: INTRAVENOUS | Status: DC

## 2020-06-22 MED ORDER — NIFEDIPINE 10 MG PO CAPS
10.0000 mg | ORAL_CAPSULE | Freq: Once | ORAL | Status: AC
  Administered 2020-06-22: 10 mg via ORAL
  Filled 2020-06-22: qty 1

## 2020-06-22 MED ORDER — BUTALBITAL-APAP-CAFFEINE 50-325-40 MG PO TABS
2.0000 | ORAL_TABLET | Freq: Once | ORAL | Status: AC
  Administered 2020-06-23: 2 via ORAL
  Filled 2020-06-22: qty 2

## 2020-06-22 NOTE — Telephone Encounter (Signed)
Pt called in and stated that she hasnt got a call back. I saw where doctor cherry messaged back. I read the message and told her that a nurse will call you but it was okay to lose her mucus plug. I told the pt that if anything changes like she starts bleeding, having contractions, or any nausea, vomiting or diarrhea & if her water breaks to please call in. The pt verbally understood. I told her a nurse would call to go more into detail. Please advise

## 2020-06-22 NOTE — Telephone Encounter (Signed)
Pt called in and stated that her mucus plug came out at 1:00 am. Pt stated that before it came out yesterday afternoon in class she was in pain. Pt not bleeding or in pain no vomiting.

## 2020-06-22 NOTE — MAU Note (Signed)
Pt c/o of contractions that started at 1730 tonight. Lost mucous plug at 0130 last night. Denies VB, LOF, reporting decreased fetal movement.

## 2020-06-22 NOTE — MAU Provider Note (Addendum)
History     CSN: [id number]  Arrival date and time: 06/22/20 4098   First Provider Initiated Contact with Patient 06/22/20 1951      Chief Complaint  Patient presents with   Contractions   HPI   Ms.Michelle Thompson is a 24 y.o. female G2P1001 @ [redacted]w[redacted]d here with contractions that started today. She does not know how often she is feeling the contractions. The contractions are painful. She is rating the contraction pain 7/10. She has no leaking of fluid or bleeding. She reports losing her mucus plug this morning. She has not taken any medication for the contractions. She has no hx of preterm labor or birth with previous pregnancy.  [facility] is [facility] care at [facility]   Chronic HTN in pregnancy:  5 weeks BP: 143/93 11 weeks BP: 148/90 15 weeks BP: 141/83 19 weeks BP: 139/87  OB History    Gravida  2   Para  1   Term  1   Preterm      AB      Living  1     SAB      TAB      Ectopic      Multiple      Live Births  1           Past Medical History:  Diagnosis Date   Hypertension     Past Surgical History:  Procedure Laterality Date   NO PAST SURGERIES      Family History  Problem Relation Age of Onset   Lupus Mother    Healthy Father    Asthma Brother    Diabetes Maternal Grandmother    COPD Maternal Grandmother    Diabetes Maternal Grandfather    Stroke Maternal Grandfather     Social History   Tobacco Use   Smoking status: Never Smoker   Smokeless tobacco: Never Used  Building services engineer Use: Never used  Substance Use Topics   Alcohol use: No   Drug use: No    Allergies:  Allergies  Allergen Reactions   Motrin [Ibuprofen] Other (See Comments)    Unknown reaction-childhood    Medications Prior to Admission  Medication Sig Dispense Refill Last Dose   Prenatal Vit-Fe Fumarate-FA (PRENATAL MULTIVITAMIN) TABS tablet Take 1 tablet by mouth daily at 12 noon.   06/21/2020 at Unknown time    Review of Systems   Gastrointestinal: Positive for abdominal pain.  Genitourinary: Negative for vaginal bleeding and vaginal discharge.   Physical Exam   Blood pressure 127/84, pulse (!) 112, temperature 98.5 F (36.9 C), temperature source Oral, resp. rate 17, height 5\' 2"  (1.575 m), weight 77.3 kg, last menstrual period 10/31/2019, SpO2 98 %.   Patient Vitals for the past 24 hrs:  BP Temp Temp src Pulse Resp SpO2 Height Weight  06/22/20 2014 127/84 -- -- -- -- -- -- --  06/22/20 1946 101/69 -- -- (!) 112 -- -- -- --  06/22/20 1945 -- -- -- -- -- 98 % -- --  06/22/20 1944 129/78 -- -- (!) 114 -- -- -- --  06/22/20 1919 (!) 144/91 98.5 F (36.9 C) Oral (!) 177 17 100 % 5\' 2"  (1.575 m) 77.3 kg    Patient Vitals for the past 24 hrs:  BP Temp Temp src Pulse Resp SpO2 Height Weight  06/22/20 2014 127/84 -- -- -- -- -- -- --  06/22/20 1946 101/69 -- -- (!) 112 -- -- -- --  06/22/20 1945 -- -- -- -- --  98 % -- --  06/22/20 1944 129/78 -- -- (!) 114 -- -- -- --  06/22/20 1919 (!) 144/91 98.5 F (36.9 C) Oral (!) 177 17 100 % 5\' 2"  (1.575 m) 77.3 kg    Physical Exam  Constitutional: She is oriented to person, place, and time. She does not appear ill. No distress.  HENT:  Head: Normocephalic.  Eyes: Pupils are equal, round, and reactive to light.  Respiratory: Effort normal.  GI: Soft. Normal appearance. There is no abdominal tenderness. There is no rebound and no guarding.  Genitourinary:    Vulva normal.     Genitourinary Comments: Cervix: closed, thick, anterior.    Neurological: She is alert and oriented to person, place, and time.  Skin: Skin is warm.   Fetal Tracing: Baseline: 140 bpm Variability: Moderate  Accelerations: 15x15 Decelerations: None Toco: Occasional, irregular pattern   MAU Course  Procedures  None  MDM  CHTN: Initial BP elevated, subsequent BP's grossly normal.  Procardia X 1 dose for contractions.  Oral hydration encouraged, cervix is closed  Report given to M.  Jimmye Norman CNM who resumes care of the patient.   Lezlie Lye, NP  2055:  Still having painful contractions 30" after dose.              Ordered second dose and some IV hydration 2300:  Has had full series and IV fluids. Cervix unchanged.  UCs not tracing on monitor but patient still reporting cramping. And contractions.  Will try dose of Levsin and Simethicone, in case this may be intestinal cramping.    Got significant relief from Levsin and Mylicon, so suspect some ot the cramping was intestinal Did c/o headache (likely from Procardia), treated with Fioricet and Reglan.   Advised to followup with Dr Marcelline Mates.  States plans to deliver here even though she gets care in [place] (does not like that hospital).  Will try to call to see if she can transfer to our clinic since she lives in [place] now  Assessment and Plan  A;  Single IUP at [redacted]w[redacted]d       Preterm uterine contractions       GI colic       Headache  P:   Discharge home       PTL precautions     Encouraged to return here or to other Urgent Care/ED if she develops worsening of symptoms, increase in pain, fever, or other concerning symptoms.    Seabron Spates, CNM

## 2020-06-22 NOTE — Telephone Encounter (Signed)
Please contact patient and assess for any signs of labor. Is ok if she loses her mucus plug.

## 2020-06-23 DIAGNOSIS — O26893 Other specified pregnancy related conditions, third trimester: Secondary | ICD-10-CM

## 2020-06-23 DIAGNOSIS — O4703 False labor before 37 completed weeks of gestation, third trimester: Secondary | ICD-10-CM

## 2020-06-23 DIAGNOSIS — Z3A34 34 weeks gestation of pregnancy: Secondary | ICD-10-CM

## 2020-06-23 DIAGNOSIS — O10913 Unspecified pre-existing hypertension complicating pregnancy, third trimester: Secondary | ICD-10-CM

## 2020-06-23 DIAGNOSIS — R519 Headache, unspecified: Secondary | ICD-10-CM

## 2020-06-23 DIAGNOSIS — O368130 Decreased fetal movements, third trimester, not applicable or unspecified: Secondary | ICD-10-CM

## 2020-06-23 MED ORDER — METOCLOPRAMIDE HCL 10 MG PO TABS
5.0000 mg | ORAL_TABLET | Freq: Once | ORAL | Status: AC
  Administered 2020-06-23: 5 mg via ORAL
  Filled 2020-06-23: qty 1

## 2020-06-23 MED ORDER — OXYCODONE HCL 5 MG PO TABS: 5.0000 mg | ORAL_TABLET | Freq: Once | ORAL | Status: DC

## 2020-06-23 NOTE — Discharge Instructions (Signed)

## 2020-06-23 NOTE — Telephone Encounter (Signed)
Called pt to check on her. Pt was seen in the ED and was given medication to help stop her contractions. Pt stated that she was doing a lot better. Pt was advised to keep an eye on her contraction and record them. Pt voiced that she understood.

## 2020-06-30 ENCOUNTER — Other Ambulatory Visit: Payer: Self-pay

## 2020-06-30 ENCOUNTER — Encounter: Payer: Self-pay | Admitting: Obstetrics and Gynecology

## 2020-06-30 ENCOUNTER — Ambulatory Visit (INDEPENDENT_AMBULATORY_CARE_PROVIDER_SITE_OTHER): Payer: Managed Care, Other (non HMO) | Admitting: Obstetrics and Gynecology

## 2020-06-30 VITALS — BP 126/83 | HR 105 | Wt 168.6 lb

## 2020-06-30 DIAGNOSIS — Z3A35 35 weeks gestation of pregnancy: Secondary | ICD-10-CM

## 2020-06-30 DIAGNOSIS — Z3483 Encounter for supervision of other normal pregnancy, third trimester: Secondary | ICD-10-CM

## 2020-06-30 LAB — POCT URINALYSIS DIPSTICK OB
Bilirubin, UA: NEGATIVE
Blood, UA: NEGATIVE
Glucose, UA: NEGATIVE
Ketones, UA: NEGATIVE
Leukocytes, UA: NEGATIVE
Nitrite, UA: NEGATIVE
Spec Grav, UA: 1.01 (ref 1.010–1.025)
Urobilinogen, UA: 0.2 E.U./dL
pH, UA: 6.5 (ref 5.0–8.0)

## 2020-06-30 NOTE — Progress Notes (Signed)
ROB: Patient feels much better today.  She has no complaints.  Says her breathing is slightly better except when she is lying down.  Reports daily fetal movement.  Cultures next visit.

## 2020-07-08 ENCOUNTER — Other Ambulatory Visit: Payer: Self-pay

## 2020-07-08 ENCOUNTER — Encounter: Payer: Self-pay | Admitting: Obstetrics and Gynecology

## 2020-07-08 ENCOUNTER — Ambulatory Visit (INDEPENDENT_AMBULATORY_CARE_PROVIDER_SITE_OTHER): Payer: Managed Care, Other (non HMO) | Admitting: Obstetrics and Gynecology

## 2020-07-08 VITALS — BP 123/86 | HR 106 | Wt 172.3 lb

## 2020-07-08 DIAGNOSIS — Z3483 Encounter for supervision of other normal pregnancy, third trimester: Secondary | ICD-10-CM

## 2020-07-08 DIAGNOSIS — R030 Elevated blood-pressure reading, without diagnosis of hypertension: Secondary | ICD-10-CM

## 2020-07-08 DIAGNOSIS — O479 False labor, unspecified: Secondary | ICD-10-CM

## 2020-07-08 DIAGNOSIS — R102 Pelvic and perineal pain: Secondary | ICD-10-CM

## 2020-07-08 DIAGNOSIS — Z3A36 36 weeks gestation of pregnancy: Secondary | ICD-10-CM

## 2020-07-08 DIAGNOSIS — O26893 Other specified pregnancy related conditions, third trimester: Secondary | ICD-10-CM

## 2020-07-08 LAB — POCT URINALYSIS DIPSTICK OB
Bilirubin, UA: NEGATIVE
Blood, UA: NEGATIVE
Glucose, UA: NEGATIVE
Ketones, UA: NEGATIVE
Leukocytes, UA: NEGATIVE
Nitrite, UA: NEGATIVE
Spec Grav, UA: 1.015 (ref 1.010–1.025)
Urobilinogen, UA: 0.2 E.U./dL
pH, UA: 7 (ref 5.0–8.0)

## 2020-07-08 NOTE — Progress Notes (Signed)
ROB-Pt present routine prenatal and 36 weeks labs. Pt stated that she did not feel well and was unable to explain the feeling. Pt c/o of lower abd pain x3 days, chills and overall body sickness.

## 2020-07-08 NOTE — Progress Notes (Signed)
ROB: Patient has complaints of general malaise.  She notes that it is difficult to describe what she is feeling, however does note some pain in her pelvis (more right-sided) that has been ongoing for ~ 3 days, experiencing chills, and just overall not feeling well. She also has been feeling her belly tighten, and come sometimes be very strong.  She denies fevers, cough, vaginal bleeding. Does note fetal movement. Advised patient to rest at home, drink fluids, warm baths and Benadryl as needed for likely [facility], and given exercises to decrease pelvic discomfort. Advised on if symptoms worsen to proceed to the ER for evaluation, and may need COVID testing. Initial BP elevated today (145/95, but repeat normal). Patient has had 1 other elevated BP in the early second trimester. No proteinuria today. Continue to monitor for PIH. 36 week labs done today. RTC in 1 week.

## 2020-07-09 ENCOUNTER — Inpatient Hospital Stay
Admission: EM | Admit: 2020-07-09 | Discharge: 2020-07-09 | Disposition: A | Discharge status: Home / Self Care | Payer: Managed Care, Other (non HMO) | Attending: Obstetrics and Gynecology | Admitting: Obstetrics and Gynecology

## 2020-07-09 ENCOUNTER — Encounter: Payer: Self-pay | Admitting: Obstetrics and Gynecology

## 2020-07-09 ENCOUNTER — Other Ambulatory Visit: Payer: Self-pay

## 2020-07-09 DIAGNOSIS — O10919 Unspecified pre-existing hypertension complicating pregnancy, unspecified trimester: Secondary | ICD-10-CM

## 2020-07-09 DIAGNOSIS — O26893 Other specified pregnancy related conditions, third trimester: Secondary | ICD-10-CM | POA: Insufficient documentation

## 2020-07-09 DIAGNOSIS — N858 Other specified noninflammatory disorders of uterus: Secondary | ICD-10-CM | POA: Insufficient documentation

## 2020-07-09 DIAGNOSIS — R1084 Generalized abdominal pain: Secondary | ICD-10-CM | POA: Diagnosis not present

## 2020-07-09 DIAGNOSIS — Z3A36 36 weeks gestation of pregnancy: Secondary | ICD-10-CM | POA: Diagnosis not present

## 2020-07-09 MED ORDER — LACTATED RINGERS IV BOLUS
500.0000 mL | Freq: Once | INTRAVENOUS | Status: AC
  Administered 2020-07-09: 500 mL via INTRAVENOUS

## 2020-07-09 MED ORDER — HYDROXYZINE HCL 25 MG PO TABS
25.0000 mg | ORAL_TABLET | Freq: Once | ORAL | Status: DC
  Filled 2020-07-09: qty 1

## 2020-07-09 NOTE — OB Triage Note (Signed)
Patient here for complaints of ctx worsening since yesterday. She rates them an 8 of 10 and states that she has tried a warm bath and taken tylenol multiple times. Denies LOF or bleeding.

## 2020-07-09 NOTE — Discharge Instructions (Signed)
Please keep your next scheduled appointment.  If you have any questions you may call the on call provider.  If you have urgent concerns please go to the nearest emergency department.

## 2020-07-10 DIAGNOSIS — N858 Other specified noninflammatory disorders of uterus: Secondary | ICD-10-CM | POA: Diagnosis present

## 2020-07-10 LAB — STREP GP B NAA: Strep Gp B NAA: POSITIVE — AB

## 2020-07-10 NOTE — Final Progress Note (Signed)
L&D OB Triage Note  Michelle Thompson is a 24 y.o. G2P1001 female at [redacted]w[redacted]d, EDD Estimated Date of Delivery: 08/02/20 who presented to triage for complaints of worsening contractions.  She has been noting contractions since yesterday, but noted that today they felt worse. Took ES Tylenol at home which did not help much. She was evaluated by the nurses with no significant findings for active labor. Vital signs stable. An NST was performed and has been reviewed by MD. She was treated with IVF bolus.  She declined use of Vistaril.    Physical Exam:  Blood pressure 132/82, last menstrual period 10/31/2019.  Cervical Exam: Dilation: 1 Effacement (%): 50 Station: -3 Exam by:: LSE rn     NST INTERPRETATION: Indications: rule out uterine contractions  Mode: External Baseline Rate (A): 145 bpm Variability: Moderate Accelerations: 15 x 15 Decelerations: None     Contraction Frequency (min): x1 with uterine irritability  Impression: reactive   Plan: NST performed was reviewed and was found to be reactive. She was discharged home with bleeding/preterm labor precautions.  Counseled again on warm baths, continued use of Tylenol, Benadry/Vistaril as needed, stretches and positions for discomfort, and use of birthing ball.   Continue routine prenatal care. Follow up with OB/GYN as previously scheduled.     Hildred Laser, MD [facility]

## 2020-07-11 LAB — GC/CHLAMYDIA PROBE AMP
Chlamydia trachomatis, NAA: NEGATIVE
Neisseria Gonorrhoeae by PCR: NEGATIVE

## 2020-07-12 ENCOUNTER — Telehealth: Payer: Self-pay | Admitting: Obstetrics and Gynecology

## 2020-07-12 ENCOUNTER — Ambulatory Visit (INDEPENDENT_AMBULATORY_CARE_PROVIDER_SITE_OTHER): Payer: Managed Care, Other (non HMO) | Admitting: Obstetrics and Gynecology

## 2020-07-12 ENCOUNTER — Other Ambulatory Visit: Payer: Self-pay

## 2020-07-12 ENCOUNTER — Encounter: Payer: Self-pay | Admitting: Obstetrics and Gynecology

## 2020-07-12 VITALS — BP 143/78 | HR 101 | Wt 173.2 lb

## 2020-07-12 DIAGNOSIS — Z3483 Encounter for supervision of other normal pregnancy, third trimester: Secondary | ICD-10-CM

## 2020-07-12 DIAGNOSIS — Z3A37 37 weeks gestation of pregnancy: Secondary | ICD-10-CM

## 2020-07-12 LAB — POCT URINALYSIS DIPSTICK OB
Bilirubin, UA: NEGATIVE
Blood, UA: NEGATIVE
Glucose, UA: NEGATIVE
Ketones, UA: NEGATIVE
Leukocytes, UA: NEGATIVE
Nitrite, UA: NEGATIVE
Spec Grav, UA: 1.015 (ref 1.010–1.025)
Urobilinogen, UA: 0.2 E.U./dL
pH, UA: 7 (ref 5.0–8.0)

## 2020-07-12 LAB — PROTEIN / CREATININE RATIO, URINE
Creatinine, Urine: 44.1 mg/dL
Protein, Ur: 11.6 mg/dL
Protein/Creat Ratio: 263 mg/g creat — ABNORMAL HIGH (ref 0–200)

## 2020-07-12 NOTE — Telephone Encounter (Signed)
Pt was checking out and said she needs a morning for her next appt. We dont have any open appts. Where can I put her on the 20th 21st or 22nd? Please let me know so I can call the pt

## 2020-07-12 NOTE — Telephone Encounter (Signed)
You can contact my 9:30 on the 22nd, and see if she received her medication in the mail.  Her appointment may need to be pushed out a couple of weeks if she has not started it yet.  Then you can put Michelle Thompson is this spot.   Dr. Valentino Saxon

## 2020-07-12 NOTE — Progress Notes (Signed)
ROB: Repeat blood pressure performed.  136/88  Urinary protein creatinine and CBC obtained.  Continue to monitor for PIH.  Signs and symptoms of PIH discussed-reinforced.  No large wt gain.  GBS positive discussed.  Patient with complaint of generalized body aches, malaise and mild headache. -Covid testing discussed.  Patient states she had a negative Covid test 2 weeks ago.  No one else sick at home.

## 2020-07-13 ENCOUNTER — Observation Stay
Admission: EM | Admit: 2020-07-13 | Discharge: 2020-07-14 | Disposition: A | Discharge status: Home / Self Care | Payer: Managed Care, Other (non HMO) | Attending: Obstetrics and Gynecology | Admitting: Obstetrics and Gynecology

## 2020-07-13 ENCOUNTER — Telehealth: Payer: Self-pay | Admitting: Obstetrics and Gynecology

## 2020-07-13 DIAGNOSIS — R0989 Other specified symptoms and signs involving the circulatory and respiratory systems: Secondary | ICD-10-CM

## 2020-07-13 DIAGNOSIS — R102 Pelvic and perineal pain: Secondary | ICD-10-CM | POA: Diagnosis not present

## 2020-07-13 DIAGNOSIS — O99013 Anemia complicating pregnancy, third trimester: Secondary | ICD-10-CM | POA: Diagnosis not present

## 2020-07-13 DIAGNOSIS — O26899 Other specified pregnancy related conditions, unspecified trimester: Secondary | ICD-10-CM | POA: Diagnosis present

## 2020-07-13 DIAGNOSIS — R519 Headache, unspecified: Secondary | ICD-10-CM | POA: Diagnosis not present

## 2020-07-13 DIAGNOSIS — O212 Late vomiting of pregnancy: Secondary | ICD-10-CM | POA: Insufficient documentation

## 2020-07-13 DIAGNOSIS — O26893 Other specified pregnancy related conditions, third trimester: Secondary | ICD-10-CM | POA: Diagnosis present

## 2020-07-13 DIAGNOSIS — Z20822 Contact with and (suspected) exposure to covid-19: Secondary | ICD-10-CM | POA: Insufficient documentation

## 2020-07-13 DIAGNOSIS — I998 Other disorder of circulatory system: Secondary | ICD-10-CM | POA: Insufficient documentation

## 2020-07-13 DIAGNOSIS — O99413 Diseases of the circulatory system complicating pregnancy, third trimester: Secondary | ICD-10-CM | POA: Diagnosis not present

## 2020-07-13 DIAGNOSIS — Z3A37 37 weeks gestation of pregnancy: Secondary | ICD-10-CM | POA: Diagnosis not present

## 2020-07-13 DIAGNOSIS — O139 Gestational [pregnancy-induced] hypertension without significant proteinuria, unspecified trimester: Secondary | ICD-10-CM | POA: Diagnosis present

## 2020-07-13 LAB — CBC WITH DIFFERENTIAL/PLATELET
Basophils Absolute: 0 10*3/uL (ref 0.0–0.2)
Basos: 0 %
EOS (ABSOLUTE): 0 10*3/uL (ref 0.0–0.4)
Eos: 1 %
Hematocrit: 31.9 % — ABNORMAL LOW (ref 34.0–46.6)
Hemoglobin: 9.6 g/dL — ABNORMAL LOW (ref 11.1–15.9)
Immature Grans (Abs): 0 10*3/uL (ref 0.0–0.1)
Immature Granulocytes: 0 %
Lymphocytes Absolute: 1.6 10*3/uL (ref 0.7–3.1)
Lymphs: 20 %
MCH: 21.5 pg — ABNORMAL LOW (ref 26.6–33.0)
MCHC: 30.1 g/dL — ABNORMAL LOW (ref 31.5–35.7)
MCV: 71 fL — ABNORMAL LOW (ref 79–97)
Monocytes Absolute: 0.6 10*3/uL (ref 0.1–0.9)
Monocytes: 8 %
Neutrophils Absolute: 5.6 10*3/uL (ref 1.4–7.0)
Neutrophils: 71 %
Platelets: 167 10*3/uL (ref 150–450)
RBC: 4.47 x10E6/uL (ref 3.77–5.28)
RDW: 16.5 % — ABNORMAL HIGH (ref 11.7–15.4)
WBC: 7.9 10*3/uL (ref 3.4–10.8)

## 2020-07-13 LAB — SARS CORONAVIRUS 2 BY RT PCR (HOSPITAL ORDER, PERFORMED IN ~~LOC~~ HOSPITAL LAB): SARS Coronavirus 2: NEGATIVE

## 2020-07-13 MED ORDER — OXYCODONE-ACETAMINOPHEN 5-325 MG PO TABS
1.0000 | ORAL_TABLET | Freq: Four times a day (QID) | ORAL | Status: DC | PRN
  Administered 2020-07-13: 2 via ORAL

## 2020-07-13 MED ORDER — FERROUS SULFATE 325 (65 FE) MG PO TABS: 325.0000 mg | ORAL_TABLET | Freq: Every day | ORAL | 1 refills | Status: DC

## 2020-07-13 MED ORDER — ONDANSETRON 4 MG PO TBDP
ORAL_TABLET | ORAL | Status: AC
  Filled 2020-07-13: qty 1

## 2020-07-13 MED ORDER — LABETALOL HCL 100 MG PO TABS
100.0000 mg | ORAL_TABLET | Freq: Two times a day (BID) | ORAL | Status: DC
  Administered 2020-07-13: 100 mg via ORAL

## 2020-07-13 MED ORDER — ONDANSETRON 4 MG PO TBDP
4.0000 mg | ORAL_TABLET | Freq: Once | ORAL | Status: AC
  Administered 2020-07-13: 4 mg via ORAL

## 2020-07-13 MED ORDER — PROMETHAZINE HCL 25 MG/ML IJ SOLN
INTRAMUSCULAR | Status: AC
  Filled 2020-07-13: qty 1

## 2020-07-13 MED ORDER — LABETALOL HCL 100 MG PO TABS
ORAL_TABLET | ORAL | Status: AC
  Filled 2020-07-13: qty 1

## 2020-07-13 MED ORDER — OXYCODONE HCL 5 MG PO TABS: 5.0000 mg | ORAL_TABLET | ORAL | Status: DC | PRN

## 2020-07-13 MED ORDER — PROMETHAZINE HCL 25 MG/ML IJ SOLN
25.0000 mg | Freq: Four times a day (QID) | INTRAMUSCULAR | Status: DC | PRN
  Administered 2020-07-13: 25 mg via INTRAMUSCULAR

## 2020-07-13 MED ORDER — SODIUM CHLORIDE 0.9 % IV BOLUS
500.0000 mL | Freq: Once | INTRAVENOUS | Status: AC
  Administered 2020-07-13: 500 mL via INTRAVENOUS

## 2020-07-13 MED ORDER — OXYCODONE-ACETAMINOPHEN 5-325 MG PO TABS
ORAL_TABLET | ORAL | Status: AC
  Filled 2020-07-13: qty 2

## 2020-07-13 NOTE — Telephone Encounter (Signed)
Tried to call the patient 3 times. Phone says that patient is not taking calls at this time.

## 2020-07-13 NOTE — Telephone Encounter (Signed)
Patient called in saying she is still having headaches, her bp is high and she cant keep any food down. Patient was wanting to be seen today. Could you please advise?

## 2020-07-13 NOTE — OB Triage Note (Signed)
Presents with complaint of headache 10/10, for 3 days. Has taken Tylenol but did not help. Has experienced nausea and vomiting today . Denies epigastric pain., Visual disturbances, or swelling. Reflexes 1+

## 2020-07-13 NOTE — Final Progress Note (Addendum)
L&D OB Triage Note  HPI:  Michelle Thompson is a 24 y.o. G33P1001 female at [redacted]w[redacted]d. Estimated Date of Delivery: 08/02/20 who presented to triage for complaints of headache 10/10 for 3 days, not relieved by Tylenol.  She also has been experiencing nausea and vomiting today. Notes that her blood pressures were high yesterday when she took them at home. She denied epigastric pain, visual disturbances. She also continues to note pelvic pain that she "cannot find the words to describe".  Of note, patient was seen in the office yesterday, had labs performed to r/o pre-eclampsia, was negative.    OB History  Gravida Para Term Preterm AB Living  2 1 1     1   SAB TAB Ectopic Multiple Live Births          1    # Outcome Date GA Lbr Len/2nd Weight Sex Delivery Anes PTL Lv  2 Current           1 Term 01/14/16    01/16/16 Vag-Spont   LIV    Patient Active Problem List   Diagnosis Date Noted  . Headache in pregnancy, antepartum, third trimester 07/13/2020  . Uterine irritability 07/10/2020  . Chronic hypertension affecting pregnancy 06/22/2020  . Headache in pregnancy, antepartum 06/08/2020  . Decreased fetal movement 06/08/2020  . Gestational hypertension     Past Medical History:  Diagnosis Date  . Hypertension     No current facility-administered medications on file prior to encounter.   Current Outpatient Medications on File Prior to Encounter  Medication Sig Dispense Refill  . Prenatal Vit-Fe Fumarate-FA (PRENATAL MULTIVITAMIN) TABS tablet Take 1 tablet by mouth daily at 12 noon.      Allergies  Allergen Reactions  . Motrin [Ibuprofen] Other (See Comments)    Unknown reaction-childhood     ROS:  Review of Systems - Negative except what is noted in HPI. Denies contractions, leakage of fluids, vaginal bleeding, or decreased fetal movement.    Physical Exam:  Temp:  [98.2 F (36.8 C)] 98.2 F (36.8 C) (07/14 1742) Pulse Rate:  [89-125] 97 (07/14 2332) Resp:  [22] 22 (07/14 1742) BP:  (116-145)/(70-98) 137/80 (07/14 2332) Weight:  [78.5 kg] 78.5 kg (07/14 1742)  General appearance: alert and no distress Abdomen: soft, mildly tender in lower pelvis where fetal head is palpable. Gravid uterus.  Pelvic: deferred Extremities: extremities normal, atraumatic, no cyanosis or edema   NST INTERPRETATION: Indications: rule out uterine contractions  Mode: External Baseline Rate (A): 130 bpm Variability: Moderate Accelerations: 15 x 15 Decelerations: None     Contraction Frequency (min): occ;UI  Impression: reactive    Labs:  Routine Prenatal on 07/12/2020  Component Date Value Ref Range Status  . Color, UA 07/12/2020 yellow   Final  . Clarity, UA 07/12/2020 clear   Final  . Glucose, UA 07/12/2020 Negative  Negative Final  . Bilirubin, UA 07/12/2020 neg   Final  . Ketones, UA 07/12/2020 neg   Final  . Spec Grav, UA 07/12/2020 1.015  1.010 - 1.025 Final  . Blood, UA 07/12/2020 neg   Final  . pH, UA 07/12/2020 7.0  5.0 - 8.0 Final  . POC,PROTEIN,UA 07/12/2020 Trace  Negative, Trace, Small (1+), Moderate (2+), Large (3+), 4+ Final  . Urobilinogen, UA 07/12/2020 0.2  0.2 or 1.0 E.U./dL Final  . Nitrite, UA 07/14/2020 neg   Final  . Leukocytes, UA 07/12/2020 Negative  Negative Final  . Creatinine, Urine 07/12/2020 44.1  Not Estab. mg/dL Final  .  Protein, Ur 07/12/2020 11.6  Not Estab. mg/dL Final  . Protein/Creat Ratio 07/12/2020 263* 0 - 200 mg/g creat Final  . WBC 07/12/2020 7.9  3.4 - 10.8 x10E3/uL Final  . RBC 07/12/2020 4.47  3.77 - 5.28 x10E6/uL Final  . Hemoglobin 07/12/2020 9.6* 11.1 - 15.9 g/dL Final  . Hematocrit 02/72/5366 31.9* 34.0 - 46.6 % Final  . MCV 07/12/2020 71* 79 - 97 fL Final  . MCH 07/12/2020 21.5* 26.6 - 33.0 pg Final  . MCHC 07/12/2020 30.1* 31 - 35 g/dL Final  . RDW 44/02/4741 16.5* 11.7 - 15.4 % Final  . Platelets 07/12/2020 167  150 - 450 x10E3/uL Final  . Neutrophils 07/12/2020 71  Not Estab. % Final  . Lymphs 07/12/2020 20  Not  Estab. % Final  . Monocytes 07/12/2020 8  Not Estab. % Final  . Eos 07/12/2020 1  Not Estab. % Final  . Basos 07/12/2020 0  Not Estab. % Final  . Neutrophils Absolute 07/12/2020 5.6  1 - 7 x10E3/uL Final  . Lymphocytes Absolute 07/12/2020 1.6  0 - 3 x10E3/uL Final  . Monocytes Absolute 07/12/2020 0.6  0 - 0 x10E3/uL Final  . EOS (ABSOLUTE) 07/12/2020 0.0  0.0 - 0.4 x10E3/uL Final  . Basophils Absolute 07/12/2020 0.0  0 - 0 x10E3/uL Final  . Immature Granulocytes 07/12/2020 0  Not Estab. % Final  . Immature Grans (Abs) 07/12/2020 0.0  0.0 - 0.1 x10E3/uL Final     Assessment:  24 y.o. G2P1001 at [redacted]w[redacted]d with:  1.  Pelvic pain 2. Headache 3. Nausea/vomiting 4. Labile BPs, likely GHTN 5. Anemia of pregnancy  Plan:  1. Pelvic pain in pregnancy - given patient abdominal binder as it is most likely due to fetal positioning low in the pelvis. Continued to encourage use of birthing ball.  2. Headache - improved (although did vomit pain medications after 1 hour post-intake).  3. Nausea/vomiting - relieved with IM Phenergan (vomited 1 hr after Zofran intake). Passed PO challenge.  4. Labile BPs, likely GHTN. Attempted to treat with low dose of Labetalol however vomited medication after 1 hour. However BPs did respond, decreasing to 110s-120s/70s-80s. Unclear if this also helped headache or if IVF and Phenergan helped. Advised to continue to monitor BPs at home. If continues to be elevated, can consider treatment with Labetalol for 1 week vs IOL in 37th week. Must weight risks vs benefits. 5. Patient with anemia of pregnancy noted in 3rd trimester. Will recommend daily iron supplement.   Follow up in clinic in 1 week for next OB visit.   Hildred Laser, MD [facility]

## 2020-07-13 NOTE — Telephone Encounter (Signed)
Spoke with patient and she is still having the headaches. She has started vomiting now. She has blurred vision when she is vomiting. She stated that she feels weak. Spoke with Dr. Valentino Saxon about this and she said she would need to got to the hospital. Patient verbalized understanding.

## 2020-07-14 DIAGNOSIS — O99013 Anemia complicating pregnancy, third trimester: Secondary | ICD-10-CM

## 2020-07-14 DIAGNOSIS — R102 Pelvic and perineal pain: Secondary | ICD-10-CM

## 2020-07-14 DIAGNOSIS — O26893 Other specified pregnancy related conditions, third trimester: Secondary | ICD-10-CM | POA: Diagnosis not present

## 2020-07-15 ENCOUNTER — Encounter: Payer: Managed Care, Other (non HMO) | Admitting: Obstetrics and Gynecology

## 2020-07-19 ENCOUNTER — Encounter (HOSPITAL_COMMUNITY): Payer: Self-pay | Admitting: Obstetrics and Gynecology

## 2020-07-19 ENCOUNTER — Inpatient Hospital Stay (HOSPITAL_COMMUNITY)
Admission: AD | Admit: 2020-07-19 | Discharge: 2020-07-22 | DRG: 806 | Disposition: A | Discharge status: Home / Self Care | Payer: Managed Care, Other (non HMO) | Attending: Obstetrics and Gynecology | Admitting: Obstetrics and Gynecology

## 2020-07-19 DIAGNOSIS — O119 Pre-existing hypertension with pre-eclampsia, unspecified trimester: Secondary | ICD-10-CM | POA: Diagnosis present

## 2020-07-19 DIAGNOSIS — O1002 Pre-existing essential hypertension complicating childbirth: Secondary | ICD-10-CM | POA: Diagnosis present

## 2020-07-19 DIAGNOSIS — O99824 Streptococcus B carrier state complicating childbirth: Secondary | ICD-10-CM | POA: Diagnosis present

## 2020-07-19 DIAGNOSIS — O113 Pre-existing hypertension with pre-eclampsia, third trimester: Secondary | ICD-10-CM | POA: Diagnosis not present

## 2020-07-19 DIAGNOSIS — O114 Pre-existing hypertension with pre-eclampsia, complicating childbirth: Secondary | ICD-10-CM | POA: Diagnosis not present

## 2020-07-19 DIAGNOSIS — O99013 Anemia complicating pregnancy, third trimester: Secondary | ICD-10-CM | POA: Diagnosis present

## 2020-07-19 DIAGNOSIS — B951 Streptococcus, group B, as the cause of diseases classified elsewhere: Secondary | ICD-10-CM

## 2020-07-19 DIAGNOSIS — Z3A38 38 weeks gestation of pregnancy: Secondary | ICD-10-CM

## 2020-07-19 DIAGNOSIS — R030 Elevated blood-pressure reading, without diagnosis of hypertension: Secondary | ICD-10-CM | POA: Diagnosis not present

## 2020-07-19 DIAGNOSIS — O10913 Unspecified pre-existing hypertension complicating pregnancy, third trimester: Secondary | ICD-10-CM | POA: Diagnosis not present

## 2020-07-19 DIAGNOSIS — D62 Acute posthemorrhagic anemia: Secondary | ICD-10-CM | POA: Diagnosis not present

## 2020-07-19 DIAGNOSIS — O9081 Anemia of the puerperium: Secondary | ICD-10-CM | POA: Diagnosis not present

## 2020-07-19 DIAGNOSIS — O10919 Unspecified pre-existing hypertension complicating pregnancy, unspecified trimester: Secondary | ICD-10-CM | POA: Diagnosis present

## 2020-07-19 DIAGNOSIS — Z20822 Contact with and (suspected) exposure to covid-19: Secondary | ICD-10-CM | POA: Diagnosis present

## 2020-07-19 NOTE — MAU Provider Note (Signed)
Chief Complaint:  Contractions and Hypertension   First Provider Initiated Contact with Patient 07/19/20 2325     HPI: Michelle Thompson is a 24 y.o. G2P1001 at 6w0dwho presents to maternity admissions reporting contractions, elevated BPs and daily headaches.  Did get some relief from Tylenol this am but still has a headache tonight.  No photopsia. She reports good fetal movement, denies LOF, vaginal bleeding, vaginal itching/burning, urinary symptoms, dizziness, n/v, diarrhea, constipation or fever/chills.  She denies  RUQ abdominal pain. Gets care at [facility] but has always planned to deliver here  Hypertension This is a recurrent problem. The current episode started more than 1 month ago. The problem has been gradually worsening since onset. Associated symptoms include headaches. Pertinent negatives include no anxiety, blurred vision, chest pain, malaise/fatigue, peripheral edema or shortness of breath. There are no associated agents to hypertension. Past treatments include nothing. There are no compliance problems.     RN Note: Patient reports feeling contractions every 10-15 mins.  Denies LOF/VB.  + FM.  States she also checked her BP yesterday and it was elevated (140s/90s) denies HA/visual disturbances/epigastric pain.    Past Medical History: Past Medical History:  Diagnosis Date  . Hypertension     Past obstetric history: OB History  Gravida Para Term Preterm AB Living  2 1 1     1   SAB TAB Ectopic Multiple Live Births          1    # Outcome Date GA Lbr Len/2nd Weight Sex Delivery Anes PTL Lv  2 Current           1 Term 01/14/16    01/16/16 Vag-Spont   LIV    Past Surgical History: Past Surgical History:  Procedure Laterality Date  . NO PAST SURGERIES      Family History: Family History  Problem Relation Age of Onset  . Lupus Mother   . Healthy Father   . Asthma Brother   . Diabetes Maternal Grandmother   . COPD Maternal Grandmother   . Diabetes Maternal Grandfather    . Stroke Maternal Grandfather     Social History: Social History   Tobacco Use  . Smoking status: Never Smoker  . Smokeless tobacco: Never Used  Vaping Use  . Vaping Use: Never used  Substance Use Topics  . Alcohol use: No  . Drug use: No    Allergies:  Allergies  Allergen Reactions  . Motrin [Ibuprofen] Other (See Comments)    Unknown reaction-childhood    Meds:  Medications Prior to Admission  Medication Sig Dispense Refill Last Dose  . Prenatal Vit-Fe Fumarate-FA (PRENATAL MULTIVITAMIN) TABS tablet Take 1 tablet by mouth daily at 12 noon.   07/19/2020 at Unknown time  . ferrous sulfate (FERROUSUL) 325 (65 FE) MG tablet Take 1 tablet (325 mg total) by mouth daily with breakfast. 60 tablet 1     I have reviewed patient's Past Medical Hx, Surgical Hx, Family Hx, Social Hx, medications and allergies.   ROS:  Review of Systems  Constitutional: Negative for malaise/fatigue.  Eyes: Negative for blurred vision.  Respiratory: Negative for shortness of breath.   Cardiovascular: Negative for chest pain.  Neurological: Positive for headaches.   Other systems negative  Physical Exam   Patient Vitals for the past 24 hrs:  BP Temp Pulse Resp SpO2 Weight  07/19/20 2313 (!) 149/96 98.5 F (36.9 C) (!) 128 19 96 % --  07/19/20 2300 -- -- -- -- -- 79 kg  Vitals:   07/19/20 2300 07/19/20 2313 07/19/20 2330 07/19/20 2345  BP:  (!) 149/96 (!) 154/89 (!) 152/103  Pulse:  (!) 128 100 (!) 105  Resp:  19    Temp:  98.5 F (36.9 C)    SpO2:  96% 99% 100%  Weight: 79 kg       Constitutional: Well-developed, well-nourished female in no acute distress.  Cardiovascular: normal rate and rhythm Respiratory: normal effort, clear to auscultation bilaterally GI: Abd soft, non-tender, gravid appropriate for gestational age.   No rebound or guarding. MS: Extremities nontender, no edema, normal ROM Neurologic: Alert and oriented x 4.  DTRs 2+, no clonus GU: Neg CVAT.  PELVIC EXAM:   Dilation: 1 Effacement (%): 50 Cervical Position: Anterior Station: Ballotable Presentation: Vertex Exam by:: Latricia Heft, RN  FHT:  Baseline 140 , moderate variability, accelerations present, no decelerations Contractions: q 3-4 mins Irregular     Labs: Results for orders placed or performed during the hospital encounter of 07/19/20 (from the past 24 hour(s))  Sample to Blood Bank     Status: None   Collection Time: 07/19/20 11:39 PM  Result Value Ref Range   Blood Bank Specimen SAMPLE AVAILABLE FOR TESTING    Sample Expiration      07/21/2020,2359 Performed at [facility], [address] [place], [place] [address]    Labs pending  O/Positive/-- (01/11 1612)  Imaging:  No results found.  MAU Course/MDM: I have ordered preeclampsia labs.  NST reviewed, reassuring Consult Dr Donavan Foil and Dr Morene Antu with presentation, exam findings and test results.  It is evident that she is chronic hypertension given her elevations as far back as 5 weeks, but now has worsening BPs (not quite severe) and daily headaches.  No photopsia.  Will admit for preeclampsia and induce/augment labor.   Assessment: Single IUP at [redacted]w[redacted]d Chronic hypertension with superimposed preeclampsia   Plan: Admit to Labor Routine orders Cytotec for further ripening MD to follow  Wynelle Bourgeois CNM, MSN Certified Nurse-Midwife 07/19/2020 11:33 PM

## 2020-07-19 NOTE — MAU Note (Signed)
Patient reports feeling contractions every 10-15 mins.  Denies LOF/VB.  + FM.  States she also checked her BP yesterday and it was elevated (140s/90s) denies HA/visual disturbances/epigastric pain.

## 2020-07-20 ENCOUNTER — Inpatient Hospital Stay (HOSPITAL_COMMUNITY): Payer: Managed Care, Other (non HMO) | Admitting: Anesthesiology

## 2020-07-20 ENCOUNTER — Encounter (HOSPITAL_COMMUNITY): Payer: Self-pay | Admitting: Obstetrics and Gynecology

## 2020-07-20 ENCOUNTER — Other Ambulatory Visit: Payer: Self-pay

## 2020-07-20 DIAGNOSIS — O1404 Mild to moderate pre-eclampsia, complicating childbirth: Secondary | ICD-10-CM | POA: Diagnosis not present

## 2020-07-20 DIAGNOSIS — O1002 Pre-existing essential hypertension complicating childbirth: Secondary | ICD-10-CM | POA: Diagnosis present

## 2020-07-20 DIAGNOSIS — Z3A38 38 weeks gestation of pregnancy: Secondary | ICD-10-CM | POA: Diagnosis not present

## 2020-07-20 DIAGNOSIS — Z20822 Contact with and (suspected) exposure to covid-19: Secondary | ICD-10-CM | POA: Diagnosis present

## 2020-07-20 DIAGNOSIS — D62 Acute posthemorrhagic anemia: Secondary | ICD-10-CM | POA: Diagnosis not present

## 2020-07-20 DIAGNOSIS — B951 Streptococcus, group B, as the cause of diseases classified elsewhere: Secondary | ICD-10-CM

## 2020-07-20 DIAGNOSIS — R030 Elevated blood-pressure reading, without diagnosis of hypertension: Secondary | ICD-10-CM | POA: Diagnosis present

## 2020-07-20 DIAGNOSIS — O99824 Streptococcus B carrier state complicating childbirth: Secondary | ICD-10-CM | POA: Diagnosis present

## 2020-07-20 DIAGNOSIS — O114 Pre-existing hypertension with pre-eclampsia, complicating childbirth: Secondary | ICD-10-CM | POA: Diagnosis present

## 2020-07-20 DIAGNOSIS — O9081 Anemia of the puerperium: Secondary | ICD-10-CM | POA: Diagnosis not present

## 2020-07-20 DIAGNOSIS — O119 Pre-existing hypertension with pre-eclampsia, unspecified trimester: Secondary | ICD-10-CM | POA: Diagnosis present

## 2020-07-20 LAB — CBC
HCT: 32.2 % — ABNORMAL LOW (ref 36.0–46.0)
HCT: 35.3 % — ABNORMAL LOW (ref 36.0–46.0)
Hemoglobin: 10 g/dL — ABNORMAL LOW (ref 12.0–15.0)
Hemoglobin: 10.6 g/dL — ABNORMAL LOW (ref 12.0–15.0)
MCH: 21.9 pg — ABNORMAL LOW (ref 26.0–34.0)
MCH: 22.4 pg — ABNORMAL LOW (ref 26.0–34.0)
MCHC: 30 g/dL (ref 30.0–36.0)
MCHC: 31.1 g/dL (ref 30.0–36.0)
MCV: 72 fL — ABNORMAL LOW (ref 80.0–100.0)
MCV: 72.9 fL — ABNORMAL LOW (ref 80.0–100.0)
Platelets: 144 10*3/uL — ABNORMAL LOW (ref 150–400)
Platelets: 157 10*3/uL (ref 150–400)
RBC: 4.47 MIL/uL (ref 3.87–5.11)
RBC: 4.84 MIL/uL (ref 3.87–5.11)
RDW: 16.3 % — ABNORMAL HIGH (ref 11.5–15.5)
RDW: 16.6 % — ABNORMAL HIGH (ref 11.5–15.5)
WBC: 8.3 10*3/uL (ref 4.0–10.5)
WBC: 9.2 10*3/uL (ref 4.0–10.5)
nRBC: 0 % (ref 0.0–0.2)
nRBC: 0 % (ref 0.0–0.2)

## 2020-07-20 LAB — COMPREHENSIVE METABOLIC PANEL
ALT: 15 U/L (ref 0–44)
AST: 23 U/L (ref 15–41)
Albumin: 2.8 g/dL — ABNORMAL LOW (ref 3.5–5.0)
Alkaline Phosphatase: 199 U/L — ABNORMAL HIGH (ref 38–126)
Anion gap: 10 (ref 5–15)
BUN: 7 mg/dL (ref 6–20)
CO2: 20 mmol/L — ABNORMAL LOW (ref 22–32)
Calcium: 9.5 mg/dL (ref 8.9–10.3)
Chloride: 105 mmol/L (ref 98–111)
Creatinine, Ser: 0.81 mg/dL (ref 0.44–1.00)
GFR calc Af Amer: >60 mL/min (ref >60)
GFR calc non Af Amer: >60 mL/min (ref >60)
Glucose, Bld: 120 mg/dL — ABNORMAL HIGH (ref 70–99)
Potassium: 3.7 mmol/L (ref 3.5–5.1)
Sodium: 135 mmol/L (ref 135–145)
Total Bilirubin: 0.4 mg/dL (ref 0.3–1.2)
Total Protein: 5.9 g/dL — ABNORMAL LOW (ref 6.5–8.1)

## 2020-07-20 LAB — PROTEIN / CREATININE RATIO, URINE
Creatinine, Urine: 42.22 mg/dL
Protein Creatinine Ratio: 0.21 mg/mg{Cre} — ABNORMAL HIGH (ref 0.00–0.15)
Total Protein, Urine: 9 mg/dL

## 2020-07-20 LAB — TYPE AND SCREEN
ABO/RH(D): O POS
Antibody Screen: NEGATIVE

## 2020-07-20 LAB — SARS CORONAVIRUS 2 BY RT PCR (HOSPITAL ORDER, PERFORMED IN ~~LOC~~ HOSPITAL LAB): SARS Coronavirus 2: NEGATIVE

## 2020-07-20 LAB — SAMPLE TO BLOOD BANK

## 2020-07-20 LAB — RPR: RPR Ser Ql: NONREACTIVE

## 2020-07-20 MED ORDER — PHENYLEPHRINE 40 MCG/ML (10ML) SYRINGE FOR IV PUSH (FOR BLOOD PRESSURE SUPPORT): 80.0000 ug | PREFILLED_SYRINGE | INTRAVENOUS | Status: DC | PRN

## 2020-07-20 MED ORDER — SODIUM CHLORIDE (PF) 0.9 % IJ SOLN
INTRAMUSCULAR | Status: DC | PRN
  Administered 2020-07-20: 12 mL/h via EPIDURAL

## 2020-07-20 MED ORDER — OXYTOCIN BOLUS FROM INFUSION
333.0000 mL | Freq: Once | INTRAVENOUS | Status: AC
  Administered 2020-07-20: 333 mL via INTRAVENOUS

## 2020-07-20 MED ORDER — LACTATED RINGERS IV SOLN: 500.0000 mL | Freq: Once | INTRAVENOUS | Status: DC

## 2020-07-20 MED ORDER — FENTANYL CITRATE (PF) 100 MCG/2ML IJ SOLN
INTRAMUSCULAR | Status: AC
  Filled 2020-07-20: qty 2

## 2020-07-20 MED ORDER — LACTATED RINGERS IV SOLN: 500.0000 mL | INTRAVENOUS | Status: DC | PRN

## 2020-07-20 MED ORDER — DIPHENHYDRAMINE HCL 50 MG/ML IJ SOLN
25.0000 mg | Freq: Once | INTRAMUSCULAR | Status: AC
  Administered 2020-07-20: 25 mg via INTRAVENOUS

## 2020-07-20 MED ORDER — ONDANSETRON HCL 4 MG/2ML IJ SOLN
4.0000 mg | Freq: Four times a day (QID) | INTRAMUSCULAR | Status: DC | PRN
  Administered 2020-07-20: 4 mg via INTRAVENOUS
  Filled 2020-07-20: qty 2

## 2020-07-20 MED ORDER — LACTATED RINGERS IV SOLN: INTRAVENOUS | Status: DC

## 2020-07-20 MED ORDER — MISOPROSTOL 25 MCG QUARTER TABLET
25.0000 ug | ORAL_TABLET | ORAL | Status: DC | PRN
  Filled 2020-07-20: qty 1

## 2020-07-20 MED ORDER — OXYTOCIN-SODIUM CHLORIDE 30-0.9 UT/500ML-% IV SOLN
1.0000 m[IU]/min | INTRAVENOUS | Status: DC
  Administered 2020-07-20: 2 m[IU]/min via INTRAVENOUS

## 2020-07-20 MED ORDER — EPHEDRINE 5 MG/ML INJ: 10.0000 mg | INTRAVENOUS | Status: DC | PRN

## 2020-07-20 MED ORDER — LIDOCAINE HCL (PF) 1 % IJ SOLN: 30.0000 mL | INTRAMUSCULAR | Status: DC | PRN

## 2020-07-20 MED ORDER — TERBUTALINE SULFATE 1 MG/ML IJ SOLN: 0.2500 mg | Freq: Once | INTRAMUSCULAR | Status: DC | PRN

## 2020-07-20 MED ORDER — OXYCODONE-ACETAMINOPHEN 5-325 MG PO TABS: 1.0000 | ORAL_TABLET | ORAL | Status: DC | PRN

## 2020-07-20 MED ORDER — PHENYLEPHRINE 40 MCG/ML (10ML) SYRINGE FOR IV PUSH (FOR BLOOD PRESSURE SUPPORT)
PREFILLED_SYRINGE | INTRAVENOUS | Status: AC
  Filled 2020-07-20: qty 10

## 2020-07-20 MED ORDER — PENICILLIN G POT IN DEXTROSE 60000 UNIT/ML IV SOLN
3.0000 10*6.[IU] | INTRAVENOUS | Status: DC
  Administered 2020-07-20 (×4): 3 10*6.[IU] via INTRAVENOUS
  Filled 2020-07-20 (×5): qty 50

## 2020-07-20 MED ORDER — OXYTOCIN-SODIUM CHLORIDE 30-0.9 UT/500ML-% IV SOLN
2.5000 [IU]/h | INTRAVENOUS | Status: DC
  Administered 2020-07-20: 2.5 [IU]/h via INTRAVENOUS
  Filled 2020-07-20: qty 500

## 2020-07-20 MED ORDER — FENTANYL CITRATE (PF) 100 MCG/2ML IJ SOLN
100.0000 ug | INTRAMUSCULAR | Status: DC | PRN
  Administered 2020-07-20: 100 ug via INTRAVENOUS

## 2020-07-20 MED ORDER — FENTANYL-BUPIVACAINE-NACL 0.5-0.125-0.9 MG/250ML-% EP SOLN: 12.0000 mL/h | EPIDURAL | Status: DC | PRN

## 2020-07-20 MED ORDER — ACETAMINOPHEN 325 MG PO TABS: 650.0000 mg | ORAL_TABLET | ORAL | Status: DC | PRN

## 2020-07-20 MED ORDER — OXYCODONE-ACETAMINOPHEN 5-325 MG PO TABS: 2.0000 | ORAL_TABLET | ORAL | Status: DC | PRN

## 2020-07-20 MED ORDER — MISOPROSTOL 50MCG HALF TABLET
50.0000 ug | ORAL_TABLET | ORAL | Status: DC | PRN
  Administered 2020-07-20 (×2): 50 ug via BUCCAL
  Filled 2020-07-20 (×2): qty 1

## 2020-07-20 MED ORDER — LACTATED RINGERS IV SOLN
500.0000 mL | Freq: Once | INTRAVENOUS | Status: AC
  Administered 2020-07-20: 500 mL via INTRAVENOUS

## 2020-07-20 MED ORDER — SOD CITRATE-CITRIC ACID 500-334 MG/5ML PO SOLN: 30.0000 mL | ORAL | Status: DC | PRN

## 2020-07-20 MED ORDER — DIPHENHYDRAMINE HCL 50 MG/ML IJ SOLN: 12.5000 mg | INTRAMUSCULAR | Status: DC | PRN

## 2020-07-20 MED ORDER — SODIUM CHLORIDE 0.9 % IV SOLN
5.0000 10*6.[IU] | Freq: Once | INTRAVENOUS | Status: AC
  Administered 2020-07-20: 5 10*6.[IU] via INTRAVENOUS
  Filled 2020-07-20: qty 5

## 2020-07-20 MED ORDER — LIDOCAINE HCL (PF) 1 % IJ SOLN
INTRAMUSCULAR | Status: DC | PRN
  Administered 2020-07-20: 5 mL via EPIDURAL

## 2020-07-20 MED ORDER — FENTANYL-BUPIVACAINE-NACL 0.5-0.125-0.9 MG/250ML-% EP SOLN
EPIDURAL | Status: AC
  Filled 2020-07-20: qty 250

## 2020-07-20 NOTE — Progress Notes (Signed)
Labor Progress Note Michelle Thompson is a 24 y.o. G2P1001 at [redacted]w[redacted]d presented for headache and elevated BP and admitted for IOL for Pre-E. S: Feeling some ctx but overall comfortable.   O:  BP (!) 147/109   Pulse 99   Temp 98 F (36.7 C) (Oral)   Resp 18   Ht 5\' 2"  (1.575 m)   Wt 79 kg   LMP 10/31/2019   SpO2 96%   BMI 31.86 kg/m  EFM: 145, moderate variability, pos accels, no decels, reactive TOCO: q5-26m  CVE: Dilation: 3 Effacement (%): 50 Cervical Position: Anterior Station: -3 Presentation: Vertex Exam by:: Dr. [email or web address]   A&P: 24 y.o. G2P1001 [redacted]w[redacted]d here for IOL for mild Pre-E. #Labor: Progressing well. S/p FB and Cytotec x1. Will give second dose; likely Pit thereafter. AROM PRN. Anticipate SVD. #Pain: per patient request #FWB: Cat I #GBS positive; PCN #Mild Pre-E: Asymptomatic currently. One severe-range BP otherwise mild range. Possible cHTN but no meds outside of pregnancy. Labs WNL. Cont to monitor.   [redacted]w[redacted]d, MD 6:17 AM

## 2020-07-20 NOTE — Progress Notes (Signed)
Michelle Thompson is a 24 y.o. G2P1001 at [redacted]w[redacted]d admitted for induction of labor due to PreE.  Subjective: Patient is feeling her contractions and is able to breathe through them.   Objective: BP (!) 138/100   Pulse (!) 103   Temp 98 F (36.7 C) (Oral)   Resp 16   Ht 5\' 2"  (1.575 m)   Wt 79 kg   LMP 10/31/2019   SpO2 96%   BMI 31.86 kg/m  No intake/output data recorded.  FHT:  FHR: 140 bpm, variability: moderate,  accelerations:  Present,  decelerations:  Absent UC:   irregular, every 1-3 minutes  SVE:   Dilation: 3.5 Effacement (%): 50 Station: -3 Exam by:: Davis,RN   Labs: Lab Results  Component Value Date   WBC 9.2 07/19/2020   HGB 10.0 (L) 07/19/2020   HCT 32.2 (L) 07/19/2020   MCV 72.0 (L) 07/19/2020   PLT 157 07/19/2020    Assessment / Plan: Michelle Thompson is a 24 y.o. G2P1001 at [redacted]w[redacted]d admitted for IOL for mild Pre-E.  #Labor Progressing well. S/p FB and Cytotec x2. Starting Pitocin at this time. AROM PRN. Anticipate SVD. #FWB:  Cat 1 #Pain Control: per patient request, planning epidural (CBC per anesthesia) #I/D: GBS positive--PCN #Anticipated MOD:  NSVD #Mild Pre-E: Asymptomatic. One severe BP reading, otherwise has been mild range. Possible cHTN but not medicated outside of pregnancy. Labs WNL. Continue monitoring BP.   [redacted]w[redacted]d DO PGY1, Family Medicine Resident 07/20/2020, 10:40 AM

## 2020-07-20 NOTE — Anesthesia Procedure Notes (Signed)
Epidural Patient location during procedure: OB Start time: 07/20/2020 3:08 PM End time: 07/20/2020 3:19 PM  Staffing Anesthesiologist: Achille Rich, MD Performed: anesthesiologist   Preanesthetic Checklist Completed: patient identified, IV checked, site marked, risks and benefits discussed, monitors and equipment checked, pre-op evaluation and timeout performed  Epidural Patient position: sitting Prep: DuraPrep Patient monitoring: heart rate, cardiac monitor, continuous pulse ox and blood pressure Approach: midline Location: L2-L3 Injection technique: LOR saline  Needle:  Needle type: Tuohy  Needle gauge: 17 G Needle length: 9 cm Needle insertion depth: 5 cm Catheter type: closed end flexible Catheter size: 19 Gauge Catheter at skin depth: 11 cm Test dose: negative and Other  Assessment Events: blood not aspirated, injection not painful, no injection resistance and negative IV test  Additional Notes Informed consent obtained prior to proceeding including risk of failure, 1% risk of PDPH, risk of minor discomfort and bruising.  Discussed rare but serious complications including epidural abscess, permanent nerve injury, epidural hematoma.  Discussed alternatives to epidural analgesia and patient desires to proceed.  Timeout performed pre-procedure verifying patient name, procedure, and platelet count.  Patient tolerated procedure well. Reason for block:procedure for pain

## 2020-07-20 NOTE — Progress Notes (Signed)
Labor Progress Note Michelle Thompson is a 24 y.o. G2P1001 at [redacted]w[redacted]d presented for IOL due to PreE.  S:  Patient is comfortable at this time without epidural. She is able to tolerate the contractions.   O:  BP (!) 144/98    Pulse 97    Temp 98.6 F (37 C) (Oral)    Resp 16    Ht 5\' 2"  (1.575 m)    Wt 79 kg    LMP 10/31/2019    SpO2 100%    BMI 31.86 kg/m   CVE: Dilation: 5 Effacement (%): 60, 70 Cervical Position: Anterior Station: -2 Presentation: Vertex Exam by:: Davis,RN   A&P:Michelle Thompson is a 24 y.o. G2P1001 [redacted]w[redacted]d admitted for IOL for Pre-E.   #Labor: Progressing well. S/p FB and Cytotec. Continue to titrate Pitocin. AROM prn.  #Pain: per patient request #FWB: Category I, FHR 135, Variability moderate, Accelerations present, Decelerations absent, UC every 4-5 mins #GBS positive, PCN #Pre-E, mild: asymptomatic with one episode of sever BP reading. Vitals stable. Continue to monitor vitals. Lab wnl.    Anticipate vaginal delivery.  [redacted]w[redacted]d, MD 2:22 PM

## 2020-07-20 NOTE — Progress Notes (Signed)
LABOR PROGRESS NOTE  Loghan Subia is a 24 y.o. G2P1001 at [redacted]w[redacted]d admitted for IOL due to mild preeclampsia.  Subjective: Patient is doing well and very comfortable. Pain is controlled. Family at bedside. No concerns at this time.  Objective: BP 127/62   Pulse 100   Temp 99.6 F (37.6 C) (Axillary)   Resp 16   Ht 5\' 2"  (1.575 m)   Wt 79 kg   LMP 10/31/2019   SpO2 99%   BMI 31.86 kg/m  or  Vitals:   07/20/20 1906 07/20/20 1930 07/20/20 2001 07/20/20 2034  BP: 123/85 127/70 133/75 127/62  Pulse: 94 91 92 100  Resp: 16 16  16   Temp: 98 F (36.7 C) 98.5 F (36.9 C)  99.6 F (37.6 C)  TempSrc: Axillary Oral  Axillary  SpO2:      Weight:      Height:        Dilation: 5 Effacement (%): 70 Cervical Position: Middle Station: -2 Presentation: Vertex Exam by:: , md FHT: Baseline rate 155, moderate varibility, + accels, - decels Toco: Contractions every 1-2 minutes  Labs: Lab Results  Component Value Date   WBC 8.3 07/20/2020   HGB 10.6 (L) 07/20/2020   HCT 35.3 (L) 07/20/2020   MCV 72.9 (L) 07/20/2020   PLT 144 (L) 07/20/2020    Patient Active Problem List   Diagnosis Date Noted  . Preeclampsia complicating hypertension 07/20/2020  . Pelvic pain in pregnancy, antepartum, third trimester   . Anemia of pregnancy in third trimester   . Headache in pregnancy, antepartum, third trimester 07/13/2020  . Uterine irritability 07/10/2020  . Chronic hypertension affecting pregnancy 06/22/2020  . Decreased fetal movement 06/08/2020  . Gestational hypertension     Assessment / Plan: 24 y.o. G2P1001 at [redacted]w[redacted]d here for IOL due to mild preeclampsia.  Labor: Progressing well. SVE now 5/70/-2. AROM performed with moderate amount of clear fluid. Mom and baby tolerated procedure well and fetal head well applied. Will continue augmentation with Pitocin and reassess in 3 hours. Fetal Wellbeing: Cat 1 tracing ID: PCN ordered  Pain Control: Epidural Anticipated  MOD:  NSVD Mild preeclampsia s/i on cHTN: BP within normal limits at this time. Asymptomatic. Will continue to monitor closely.  [redacted]w[redacted]d, MD Family Medicine PGY-3 07/20/2020, 9:27 PM

## 2020-07-20 NOTE — Progress Notes (Signed)
Patient ID: Michelle Thompson, female   DOB: 10/23/1996, 24 y.o.   MRN: [id number] Michelle Thompson is a 24 y.o. G2P1001 at [redacted]w[redacted]d.  Subjective: Comfortable w/ epidural  Objective: BP (!) 138/96   Pulse 99   Temp 98.6 F (37 C) (Oral)   Resp 18   Ht 5\' 2"  (1.575 m)   Wt 79 kg   LMP 10/31/2019   SpO2 99%   BMI 31.86 kg/m    FHT:  FHR: 155 bpm, variability: mod,  accelerations:  15x15,  decelerations:  Mild variables UC:   Q 2-4 minutes, moderate Dilation: 5 Effacement (%): 60 Cervical Position: Middle Station: -2 Presentation: Vertex BBOW Exam by:: [email or web address], CNM  Labs: NA  Assessment / Plan: [redacted]w[redacted]d week IUP Labor: Early-active Fetal Wellbeing:  Category I Pain Control:  Epidural Anticipated MOD:  SVD Continue increasing pitocin PRN to achieve adequate contractions.   [redacted]w[redacted]d, Katrinka Blazing, [place] 07/20/2020 4:37 PM

## 2020-07-20 NOTE — Discharge Summary (Signed)
Postpartum Discharge Summary     Patient Name: Michelle Thompson DOB: [birth year] MRN: [id number]  Date of admission: 07/19/2020 Delivery date:07/20/2020  Delivering provider: Chauncey Mann  Date of discharge: 07/22/2020  Admitting diagnosis: Preeclampsia complicating hypertension [O11.9] Intrauterine pregnancy: [redacted]w[redacted]d    Secondary diagnosis:  Active Problems:   Chronic hypertension affecting pregnancy   Anemia of pregnancy in third trimester   Preeclampsia complicating hypertension   Positive GBS test  Additional problems: None    Discharge diagnosis: Term Pregnancy Delivered and Preeclampsia (mild)                                              Post partum procedures:None Augmentation: AROM, Pitocin, Cytotec and IP Foley Complications: None  Hospital course: Induction of Labor With Vaginal Delivery   24y.o. yo G2P1001 at 331w1das admitted to the hospital 07/19/2020 for induction of labor.  Indication for induction: cHTN with mild Pre-E.  Patient had an uncomplicated labor course as follows: Initial SVE upon admission was 1/50/-3. Patient received Cytotec x2 and FB placement with progression to 3.5/50/-3. Patient was started on Pitocin at 1030 and progressed to 5/70/-2. AROM performed at 2038 with clear fluid and patient ultimately progressed to 10/100/+2 at 2200.  Membrane Rupture Time/Date: 8:38 PM ,07/20/2020   Delivery Method:Vaginal, Spontaneous  Episiotomy: None  Lacerations:  None  Details of delivery can be found in separate delivery note. BP's monitored and elevated; Norvasc 5 mg initiated and ultimately increased to 10 mg on day of discharge. Patient had a routine postpartum course. Patient is discharged home 07/22/20.  Newborn Data: Birth date:07/20/2020  Birth time:10:27 PM  Gender:Female  Living status:Living  Apgars:8 ,9  Weight:2906 g   Magnesium Sulfate received: No BMZ received: No Rhophylac:N/A MMR:N/A T-DaP:Given prenatally Flu:  No Transfusion:No  Physical exam  Vitals:   07/21/20 0955 07/21/20 1338 07/21/20 1931 07/22/20 0637  BP: (!) 146/90 (!) 142/94 (!) 142/95 (!) 152/109  Pulse: 90 91 86 93  Resp: '18 16 18 18  ' Temp: 98.2 F (36.8 C)  97.7 F (36.5 C) 98.1 F (36.7 C)  TempSrc: Oral  Oral Oral  SpO2:   99%   Weight:      Height:       General: alert, cooperative and no distress Lochia: appropriate Uterine Fundus: firm Incision: N/A DVT Evaluation: No evidence of DVT seen on physical exam. Labs: Lab Results  Component Value Date   WBC 11.6 (H) 07/20/2020   HGB 9.9 (L) 07/20/2020   HCT 32.2 (L) 07/20/2020   MCV 72.5 (L) 07/20/2020   PLT 141 (L) 07/20/2020   CMP Latest Ref Rng & Units 07/19/2020  Glucose 70 - 99 mg/dL 120(H)  BUN 6 - 20 mg/dL 7  Creatinine 0.44 - 1.00 mg/dL 0.81  Sodium 135 - 145 mmol/L 135  Potassium 3.5 - 5.1 mmol/L 3.7  Chloride 98 - 111 mmol/L 105  CO2 22 - 32 mmol/L 20(L)  Calcium 8.9 - 10.3 mg/dL 9.5  Total Protein 6.5 - 8.1 g/dL 5.9(L)  Total Bilirubin 0.3 - 1.2 mg/dL 0.4  Alkaline Phos 38 - 126 U/L 199(H)  AST 15 - 41 U/L 23  ALT 0 - 44 U/L 15   Edinburgh Score: Edinburgh Postnatal Depression Scale Screening Tool 07/21/2020  I have been able to laugh and see the funny side of things.  0  I have looked forward with enjoyment to things. 0  I have blamed myself unnecessarily when things went wrong. 0  I have been anxious or worried for no good reason. 0  I have felt scared or panicky for no good reason. 0  Things have been getting on top of me. 0  I have been so unhappy that I have had difficulty sleeping. 0  I have felt sad or miserable. 0  I have been so unhappy that I have been crying. 0  The thought of harming myself has occurred to me. 0  Edinburgh Postnatal Depression Scale Total 0     After visit meds:  Allergies as of 07/22/2020      Reactions   Fentanyl Shortness Of Breath, Itching   Questionable SOB after IV Fentanyl      Medication List     STOP taking these medications   ferrous sulfate 325 (65 FE) MG tablet Commonly known as: FerrouSul     TAKE these medications   acetaminophen 325 MG tablet Commonly known as: Tylenol Take 2 tablets (650 mg total) by mouth every 6 (six) hours as needed (for pain scale < 4).   amLODipine 10 MG tablet Commonly known as: NORVASC Take 1 tablet (10 mg total) by mouth daily.   ibuprofen 600 MG tablet Commonly known as: ADVIL Take 1 tablet (600 mg total) by mouth every 8 (eight) hours as needed for mild pain.   prenatal multivitamin Tabs tablet Take 1 tablet by mouth daily at 12 noon.        Discharge home in stable condition Infant Feeding: Bottle Infant Disposition:home with mother Discharge instruction: per After Visit Summary and Postpartum booklet. Activity: Advance as tolerated. Pelvic rest for 6 weeks.  Diet: routine diet Future Appointments: Future Appointments  Date Time Provider [place]  07/27/2020  1:20 PM [place] None  08/23/2020  1:00 PM [place], Kathie Dike, CNM [facility] None   Follow up Visit:  F/[facility] requested at [facility] per patient request.   Please schedule this patient for a In person postpartum visit in 4 weeks with the following provider: Any provider. Additional Postpartum F/U:BP check 1 week and Nexplanon insertion in 4-6 weeks  Low risk pregnancy complicated by: HTN Delivery mode:  Vaginal, Spontaneous  Anticipated Birth Control:  Nexplanon outpatient    07/22/2020 Chauncey Mann, MD

## 2020-07-20 NOTE — Progress Notes (Signed)
Patient ID: Michelle Thompson, female   DOB: [birth year], 24 y.o.   MRN: [id number] Elaijah Munoz is a 24 y.o. G2P1001 at [redacted]w[redacted]d.  Subjective: Comfortable w/ epidural.   Objective: BP (!) 104/53   Pulse 84   Temp 98.6 F (37 C) (Oral)   Resp 16   Ht 5\' 2"  (1.575 m)   Wt 79 kg   LMP 10/31/2019   SpO2 99%   BMI 31.86 kg/m    FHT:  FHR: 145 bpm, variability: mod,  accelerations:  15x15,  decelerations:  Mild variables UC:   Q 1-5 minutes, mod-strong Dilation: 5 Effacement (%): 60 Cervical Position: Middle Station: -2 Presentation: Vertex Exam by:: [email or web address], CNM  Baby slightly oblique w/ head to maternal right. Not well-applied  Labs: NA  Assessment / Plan: [redacted]w[redacted]d week IUP Labor: protracted active. Suspect fetal mal-position. Will try exaggerated sims, consider abd binder. AROM when well-applied and consider IUPC.   Fetal Wellbeing:  Category I Pain Control:  Edpiral Anticipated MOD:  SVD  [redacted]w[redacted]d, CNM 07/20/2020 6:53 PM

## 2020-07-20 NOTE — Plan of Care (Signed)
A.Deiontae Rabel, RN 

## 2020-07-20 NOTE — H&P (Addendum)
LABOR AND DELIVERY ADMISSION HISTORY AND PHYSICAL NOTE  Michelle Thompson is a 24 y.o. female G2P1001 with IUP at [redacted]w[redacted]d by [place] presenting for headaches and elevated BP. Patient reports feeling badly for the last week or so with intermittently elevated blood pressures and headaches. She does not report having high blood pressures before pregnancy but did have some that were elevated early on in her pregnancy. She has not had any SOB, RUQ pain, LE edema, or vision changes. She reports good fetal movement and no LOF or vaginal bleeding. She has been feeling contractions every 10-15 minutes or so.  Prenatal History/Complications: PNC at [place] Pregnancy complications:  - cHTN - Mild preeclampsia - Anemia  [place] 03/10/2020: Singleton intrauterine pregnancy is visualized with FHR at 142 BPM. Biometrics give an (U/S) Gestational age of [redacted]w[redacted]d and an (U/S) EDD of 08/02/2020; this correlates with the clinically established Estimated Date of Delivery: 08/02/20 Fetal presentation is Variable.  EFW: 287 g ( 10 oz). Placenta: anterior. Grade: 1 AFI: subjectively normal.  Past Medical History: Past Medical History:  Diagnosis Date  . Hypertension     Past Surgical History: Past Surgical History:  Procedure Laterality Date  . NO PAST SURGERIES      Obstetrical History: OB History    Gravida  2   Para  1   Term  1   Preterm      AB      Living  1     SAB      TAB      Ectopic      Multiple      Live Births  1           Social History: Social History   Socioeconomic History  . Marital status: Single    Spouse name: Not on file  . Number of children: Not on file  . Years of education: Not on file  . Highest education level: Not on file  Occupational History  . Not on file  Tobacco Use  . Smoking status: Never Smoker  . Smokeless tobacco: Never Used  Vaping Use  . Vaping Use: Never used  Substance and Sexual Activity  . Alcohol use: No  . Drug use: No  . Sexual  activity: Yes    Birth control/protection: None  Other Topics Concern  . Not on file  Social History Narrative  . Not on file   Social Determinants of Health   Financial Resource Strain:   . Difficulty of Paying Living Expenses:   Food Insecurity:   . Worried About Programme researcher, broadcasting/film/video in the Last Year:   . Barista in the Last Year:   Transportation Needs:   . Freight forwarder (Medical):   Marland Kitchen Lack of Transportation (Non-Medical):   Physical Activity:   . Days of Exercise per Week:   . Minutes of Exercise per Session:   Stress:   . Feeling of Stress :   Social Connections:   . Frequency of Communication with Friends and Family:   . Frequency of Social Gatherings with Friends and Family:   . Attends Religious Services:   . Active Member of Clubs or Organizations:   . Attends Banker Meetings:   Marland Kitchen Marital Status:     Family History: Family History  Problem Relation Age of Onset  . Lupus Mother   . Healthy Father   . Asthma Brother   . Diabetes Maternal Grandmother   . COPD Maternal Grandmother   .  Diabetes Maternal Grandfather   . Stroke Maternal Grandfather     Allergies: Allergies  Allergen Reactions  . Motrin [Ibuprofen] Other (See Comments)    Unknown reaction-childhood    Medications Prior to Admission  Medication Sig Dispense Refill Last Dose  . Prenatal Vit-Fe Fumarate-FA (PRENATAL MULTIVITAMIN) TABS tablet Take 1 tablet by mouth daily at 12 noon.   07/19/2020 at Unknown time  . ferrous sulfate (FERROUSUL) 325 (65 FE) MG tablet Take 1 tablet (325 mg total) by mouth daily with breakfast. 60 tablet 1      Review of Systems  All systems reviewed and negative except as stated in HPI  Physical Exam Blood pressure (!) 141/79, pulse 95, temperature 98.8 F (37.1 C), temperature source Oral, resp. rate 16, height 5\' 2"  (1.575 m), weight 79 kg, last menstrual period 10/31/2019, SpO2 97 %. General appearance: Alert, oriented,  NAD Lungs: Normal respiratory effort Heart: Regular rate Abdomen: Soft, non-tender; gravid, FH appropriate for GA Extremities: No calf swelling or tenderness Presentation: Cephalic confirmed via SVE Fetal monitoring: Baseline 145, moderate variability, + accels, - decels Uterine activity: Uterine irritability Dilation: 1 Effacement (%): Thick Station: -3 Exam by:: Dr [email or web address]   Prenatal labs: ABO, Rh: --/--/O POS (07/20 2339) Antibody: NEG (07/20 2339) Rubella: 15.40 (01/11 1612) RPR: Non Reactive (05/06 0841)  HBsAg: Negative (01/11 1612)  HIV: Non Reactive (01/11 1612)  GC/Chlamydia: Negative 07/08/20 GBS: Positive/-- (07/09 1355)  1-hr GTT: 116 05/05/20 Genetic screening: Normal  Anatomy 07/05/20: Normal - Female   Prenatal Transfer Tool  Maternal Diabetes: No Genetic Screening: Normal Maternal Ultrasounds/Referrals: Normal Fetal Ultrasounds or other Referrals:  None Maternal Substance Abuse:  No Significant Maternal Medications:  None Significant Maternal Lab Results: Group B Strep positive  Results for orders placed or performed during the hospital encounter of 07/19/20 (from the past 24 hour(s))  Protein / creatinine ratio, urine   Collection Time: 07/19/20 11:24 PM  Result Value Ref Range   Creatinine, Urine 42.22 mg/dL   Total Protein, Urine 9 mg/dL   Protein Creatinine Ratio 0.21 (H) 0.00 - 0.15 mg/mg[Cre]  CBC   Collection Time: 07/19/20 11:39 PM  Result Value Ref Range   WBC 9.2 4.0 - 10.5 K/uL   RBC 4.47 3.87 - 5.11 MIL/uL   Hemoglobin 10.0 (L) 12.0 - 15.0 g/dL   HCT 07/21/20 (L) 36 - 46 %   MCV 72.0 (L) 80.0 - 100.0 fL   MCH 22.4 (L) 26.0 - 34.0 pg   MCHC 31.1 30.0 - 36.0 g/dL   RDW 03.0 (H) 09.2 - 33.0 %   Platelets 157 150 - 400 K/uL   nRBC 0.0 0.0 - 0.2 %  Comprehensive metabolic panel   Collection Time: 07/19/20 11:39 PM  Result Value Ref Range   Sodium 135 135 - 145 mmol/L   Potassium 3.7 3.5 - 5.1 mmol/L   Chloride 105 98 - 111 mmol/L   CO2 20 (L) 22  - 32 mmol/L   Glucose, Bld 120 (H) 70 - 99 mg/dL   BUN 7 6 - 20 mg/dL   Creatinine, Ser 07/21/20 0.44 - 1.00 mg/dL   Calcium 9.5 8.9 - 2.26 mg/dL   Total Protein 5.9 (L) 6.5 - 8.1 g/dL   Albumin 2.8 (L) 3.5 - 5.0 g/dL   AST 23 15 - 41 U/L   ALT 15 0 - 44 U/L   Alkaline Phosphatase 199 (H) 38 - 126 U/L   Total Bilirubin 0.4 0.3 - 1.2 mg/dL   GFR  calc non Af Amer >60 >60 mL/min   GFR calc Af Amer >60 >60 mL/min   Anion gap 10 5 - 15  Sample to Blood Bank   Collection Time: 07/19/20 11:39 PM  Result Value Ref Range   Blood Bank Specimen SAMPLE AVAILABLE FOR TESTING    Sample Expiration      07/21/2020,2359 Performed at [facility], [address] [place], [place] [address]   Type and screen [facility]   Collection Time: 07/19/20 11:39 PM  Result Value Ref Range   ABO/RH(D) O POS    Antibody Screen NEG    Sample Expiration      07/22/2020,2359 Performed at [facility], [address], [place], [place] [address]   SARS Coronavirus 2 by RT PCR (hospital order, performed in [facility] hospital lab) Nasopharyngeal Nasopharyngeal Swab   Collection Time: 07/20/20  1:02 AM   Specimen: Nasopharyngeal Swab  Result Value Ref Range   SARS Coronavirus 2 NEGATIVE NEGATIVE    Patient Active Problem List   Diagnosis Date Noted  . Preeclampsia complicating hypertension 07/20/2020  . Pelvic pain in pregnancy, antepartum, third trimester   . Anemia of pregnancy in third trimester   . Headache in pregnancy, antepartum, third trimester 07/13/2020  . Uterine irritability 07/10/2020  . Chronic hypertension affecting pregnancy 06/22/2020  . Decreased fetal movement 06/08/2020  . Gestational hypertension     Assessment: Gabrille Kilbride is a 24 y.o. G2P1001 at [redacted]w[redacted]d here for IOL for mild preeclampsia superimposed on cHTN (no meds).  #Labor: Initial SVE 1/50/-3. Induction initiated with buccal Cytotec x1 and FB placement. Will plan to reassess in 4  hours. #Pain: Planning for epidural; per patient request #FWB: Cat 1 tracing #ID:  Positive, PCN ordered #MOF: Bottle #MOC: Nexplanon outpatient #Circ:  Yes #Preeclampsia s/i cHTN: Hx elevated blood pressures early in pregnancy now more persistent with headaches. No other symptoms at this time. No chronic antihypertensive medications. BP mild range and headache has now resolved. CBC and CMP stable with P:Cr 0.21. Will continue to monitor pressures, exam, and labs closely. #Anemia: On PO iron. Hgb on admission 10.0.  Worthy Rancher, MD Family Medicine PGY-3 07/20/2020, 2:30 AM  I saw and evaluated the patient. I agree with the findings and the plan of care as documented in the resident's note. EFW 3200g. Anticipate SVD.   Jerilynn Birkenhead, MD [facility] Family Medicine Fellow, [facility] for [employer], [facility]

## 2020-07-20 NOTE — Anesthesia Preprocedure Evaluation (Signed)
Anesthesia Evaluation  Patient identified by MRN, date of birth, ID band Patient awake    Reviewed: Allergy & Precautions, H&P , NPO status , Patient's Chart, lab work & pertinent test results  Airway Mallampati: II   Neck ROM: full    Dental   Pulmonary    breath sounds clear to auscultation       Cardiovascular hypertension,  Rhythm:regular Rate:Normal     Neuro/Psych  Headaches,    GI/Hepatic   Endo/Other    Renal/GU      Musculoskeletal   Abdominal   Peds  Hematology   Anesthesia Other Findings   Reproductive/Obstetrics (+) Pregnancy                             Anesthesia Physical Anesthesia Plan  ASA: II  Anesthesia Plan: Epidural   Post-op Pain Management:    Induction: Intravenous  PONV Risk Score and Plan: 2 and Treatment may vary due to age or medical condition  Airway Management Planned: Natural Airway  Additional Equipment:   Intra-op Plan:   Post-operative Plan:   Informed Consent: I have reviewed the patients History and Physical, chart, labs and discussed the procedure including the risks, benefits and alternatives for the proposed anesthesia with the patient or authorized representative who has indicated his/her understanding and acceptance.       Plan Discussed with: Anesthesiologist  Anesthesia Plan Comments:         Anesthesia Quick Evaluation

## 2020-07-20 NOTE — Progress Notes (Signed)
RN updated Katrinka Blazing, CNM of pt's request for IV fentanyl but previous nurse stated in report that after receiving became itchy and pt complained of shortness of breath, previous RN reported O2 sats WNL, Smith, CNM made aware of side effects/reaction and IV Fentanyl discontinued. Requests RN to notify anesthesia.

## 2020-07-21 ENCOUNTER — Encounter: Payer: Managed Care, Other (non HMO) | Admitting: Obstetrics and Gynecology

## 2020-07-21 LAB — CBC
HCT: 32.2 % — ABNORMAL LOW (ref 36.0–46.0)
Hemoglobin: 9.9 g/dL — ABNORMAL LOW (ref 12.0–15.0)
MCH: 22.3 pg — ABNORMAL LOW (ref 26.0–34.0)
MCHC: 30.7 g/dL (ref 30.0–36.0)
MCV: 72.5 fL — ABNORMAL LOW (ref 80.0–100.0)
Platelets: 141 10*3/uL — ABNORMAL LOW (ref 150–400)
RBC: 4.44 MIL/uL (ref 3.87–5.11)
RDW: 16.3 % — ABNORMAL HIGH (ref 11.5–15.5)
WBC: 11.6 10*3/uL — ABNORMAL HIGH (ref 4.0–10.5)
nRBC: 0 % (ref 0.0–0.2)

## 2020-07-21 MED ORDER — BENZOCAINE-MENTHOL 20-0.5 % EX AERO: 1.0000 "application " | INHALATION_SPRAY | CUTANEOUS | Status: DC | PRN

## 2020-07-21 MED ORDER — DIBUCAINE (PERIANAL) 1 % EX OINT: 1.0000 "application " | TOPICAL_OINTMENT | CUTANEOUS | Status: DC | PRN

## 2020-07-21 MED ORDER — WITCH HAZEL-GLYCERIN EX PADS: 1.0000 "application " | MEDICATED_PAD | CUTANEOUS | Status: DC | PRN

## 2020-07-21 MED ORDER — ACETAMINOPHEN 325 MG PO TABS
650.0000 mg | ORAL_TABLET | Freq: Four times a day (QID) | ORAL | Status: DC | PRN
  Administered 2020-07-21: 650 mg via ORAL
  Filled 2020-07-21: qty 2

## 2020-07-21 MED ORDER — ONDANSETRON HCL 4 MG/2ML IJ SOLN: 4.0000 mg | INTRAMUSCULAR | Status: DC | PRN

## 2020-07-21 MED ORDER — PRENATAL MULTIVITAMIN CH
1.0000 | ORAL_TABLET | Freq: Every day | ORAL | Status: DC
  Administered 2020-07-21 – 2020-07-22 (×2): 1 via ORAL
  Filled 2020-07-21 (×2): qty 1

## 2020-07-21 MED ORDER — AMLODIPINE BESYLATE 5 MG PO TABS
5.0000 mg | ORAL_TABLET | Freq: Every day | ORAL | Status: DC
  Administered 2020-07-21: 5 mg via ORAL
  Filled 2020-07-21: qty 1

## 2020-07-21 MED ORDER — SENNOSIDES-DOCUSATE SODIUM 8.6-50 MG PO TABS
2.0000 | ORAL_TABLET | ORAL | Status: DC
  Administered 2020-07-21 – 2020-07-22 (×2): 2 via ORAL
  Filled 2020-07-21 (×2): qty 2

## 2020-07-21 MED ORDER — IBUPROFEN 600 MG PO TABS
600.0000 mg | ORAL_TABLET | Freq: Three times a day (TID) | ORAL | Status: DC | PRN
  Administered 2020-07-21 – 2020-07-22 (×4): 600 mg via ORAL
  Filled 2020-07-21 (×4): qty 1

## 2020-07-21 MED ORDER — DIPHENHYDRAMINE HCL 25 MG PO CAPS: 25.0000 mg | ORAL_CAPSULE | Freq: Four times a day (QID) | ORAL | Status: DC | PRN

## 2020-07-21 MED ORDER — ONDANSETRON HCL 4 MG PO TABS: 4.0000 mg | ORAL_TABLET | ORAL | Status: DC | PRN

## 2020-07-21 MED ORDER — SIMETHICONE 80 MG PO CHEW: 80.0000 mg | CHEWABLE_TABLET | ORAL | Status: DC | PRN

## 2020-07-21 MED ORDER — TETANUS-DIPHTH-ACELL PERTUSSIS 5-2.5-18.5 LF-MCG/0.5 IM SUSP: 0.5000 mL | Freq: Once | INTRAMUSCULAR | Status: DC

## 2020-07-21 MED ORDER — MEASLES, MUMPS & RUBELLA VAC IJ SOLR: 0.5000 mL | Freq: Once | INTRAMUSCULAR | Status: DC

## 2020-07-21 MED ORDER — COCONUT OIL OIL: 1.0000 "application " | TOPICAL_OIL | Status: DC | PRN

## 2020-07-21 NOTE — Progress Notes (Addendum)
POSTPARTUM PROGRESS NOTE  Post Partum Day 1  Subjective:  Michelle Thompson is a 24 y.o. [id number] s/p NSVD at [redacted]w[redacted]d.  She reports she is doing well. No acute events overnight. She denies any problems with ambulating, voiding or PO intake. Pain is well controlled.  Lochia is appropriate.  Objective: Blood pressure 132/78, pulse 71, temperature 98.4 F (36.9 C), temperature source Oral, resp. rate 18, height 5\' 2"  (1.575 m), weight 79 kg, last menstrual period 10/31/2019, SpO2 99 %, unknown if currently breastfeeding.  Physical Exam:  General: Alert, cooperative and no distress Chest: No respiratory distress Heart: Regular rate, distal pulses intact Abdomen: Soft, nontender Uterine Fundus: Firm DVT Evaluation: No calf swelling or tenderness Extremities: No edema Skin: Warm, dry  Recent Labs    07/20/20 1045 07/20/20 2322  HGB 10.6* 9.9*  HCT 35.3* 32.2*    Assessment/Plan: Michelle Thompson is a 24 y.o. 21 s/p NSVD at [redacted]w[redacted]d  PPD#1 - Doing well. Continue routine postpartum care.   1. Mild preeclampsia s/i cHTN: BP within normal limits since delivery. Asymptomatic. Will continue to monitor pressures. 2. Contraception: Nexplanon outpatient 3. Feeding: Bottle; doing well  Dispo: Plan for discharge PPD#2.   LOS: 1 day   [redacted]w[redacted]d, MD Family Medicine PGY-3 07/21/2020, 7:55 AM   I saw and evaluated the patient. I agree with the findings and the plan of care as documented in the resident's note. Norvasc 5 mg initiated due to normal to mild range BP's.   07/23/2020, MD [facility] Family Medicine Fellow, [facility] for [facility]

## 2020-07-21 NOTE — Anesthesia Postprocedure Evaluation (Signed)
Anesthesia Post Note  Patient: Michelle Thompson  Procedure(s) Performed: AN AD HOC LABOR EPIDURAL     Patient location during evaluation: Mother Baby Anesthesia Type: Epidural Level of consciousness: awake and alert Pain management: pain level controlled Vital Signs Assessment: post-procedure vital signs reviewed and stable Respiratory status: spontaneous breathing, nonlabored ventilation and respiratory function stable Cardiovascular status: stable Postop Assessment: no headache, no backache and epidural receding Anesthetic complications: no   No complications documented.  Last Vitals:  Vitals:   07/21/20 0130 07/21/20 0520  BP: 130/86 132/78  Pulse: 99 71  Resp: 18 18  Temp: 37 C 36.9 C  SpO2: 98% 99%    Last Pain:  Vitals:   07/21/20 0520  TempSrc: Oral  PainSc: 0-No pain   Pain Goal:                   [facility] Records

## 2020-07-22 MED ORDER — IBUPROFEN 600 MG PO TABS: 600.0000 mg | ORAL_TABLET | Freq: Three times a day (TID) | ORAL | 0 refills | Status: DC | PRN

## 2020-07-22 MED ORDER — AMLODIPINE BESYLATE 5 MG PO TABS
10.0000 mg | ORAL_TABLET | Freq: Every day | ORAL | Status: DC
  Administered 2020-07-22: 10 mg via ORAL
  Filled 2020-07-22: qty 2

## 2020-07-22 MED ORDER — AMLODIPINE BESYLATE 10 MG PO TABS: 10.0000 mg | ORAL_TABLET | Freq: Every day | ORAL | 0 refills | Status: DC

## 2020-07-22 MED ORDER — ACETAMINOPHEN 325 MG PO TABS: 650.0000 mg | ORAL_TABLET | Freq: Four times a day (QID) | ORAL | 0 refills | Status: DC | PRN

## 2020-07-22 MED FILL — AMLODIPINE BESYLATE 10 MG T: 10 | 30 days supply | Qty: 30 | Fill #0

## 2020-07-22 MED FILL — IBUPROFEN 600 MG TABS: 600 | 10 days supply | Qty: 30 | Fill #0

## 2020-07-22 MED FILL — ACETAMINOPHEN 325 MG TABS: 325 | 7 days supply | Qty: 30 | Fill #0

## 2020-07-22 NOTE — Discharge Instructions (Signed)

## 2020-07-27 ENCOUNTER — Ambulatory Visit: Payer: Managed Care, Other (non HMO)

## 2020-08-02 ENCOUNTER — Other Ambulatory Visit: Payer: Self-pay

## 2020-08-02 ENCOUNTER — Ambulatory Visit (INDEPENDENT_AMBULATORY_CARE_PROVIDER_SITE_OTHER): Payer: Managed Care, Other (non HMO)

## 2020-08-02 VITALS — BP 129/83 | HR 96 | Wt 152.0 lb

## 2020-08-02 DIAGNOSIS — R300 Dysuria: Secondary | ICD-10-CM

## 2020-08-02 LAB — POCT URINALYSIS DIPSTICK
Bilirubin, UA: NEGATIVE
Glucose, UA: NEGATIVE
Nitrite, UA: NEGATIVE
Protein, UA: POSITIVE — AB
Spec Grav, UA: >=1.03 — AB (ref 1.010–1.025)
Urobilinogen, UA: 0.2 E.U./dL
pH, UA: 5 (ref 5.0–8.0)

## 2020-08-02 MED ORDER — NITROFURANTOIN MONOHYD MACRO 100 MG PO CAPS
100.0000 mg | ORAL_CAPSULE | Freq: Two times a day (BID) | ORAL | 0 refills | Status: DC
Start: 2020-08-02 — End: 2020-12-12

## 2020-08-02 NOTE — Progress Notes (Signed)
Agree with A & P. 

## 2020-08-02 NOTE — Progress Notes (Signed)
Subjective:  Michelle Thompson is a 23 y.o. female here for BP check.   Hypertension ROS: taking medications as instructed, no medication side effects noted, no TIA's, no chest pain on exertion, no dyspnea on exertion and no swelling of ankles.    Objective:  BP 129/83    Pulse 96    Wt 152 lb (68.9 kg)    Breastfeeding No    BMI 27.80 kg/m   Appearance alert, well appearing, and in no distress. General exam BP noted to be well controlled today in office.    Assessment:   Blood Pressure well controlled.   Plan:  Current treatment plan is effective, no change in therapy.Marland Kitchen

## 2020-08-06 ENCOUNTER — Other Ambulatory Visit: Payer: Self-pay | Admitting: Obstetrics & Gynecology

## 2020-08-06 DIAGNOSIS — R300 Dysuria: Secondary | ICD-10-CM

## 2020-08-06 LAB — URINE CULTURE

## 2020-08-06 MED ORDER — AMPICILLIN 500 MG PO CAPS: 500.0000 mg | ORAL_CAPSULE | Freq: Three times a day (TID) | ORAL | 0 refills | Status: DC

## 2020-08-09 ENCOUNTER — Telehealth: Payer: Self-pay

## 2020-08-09 NOTE — Telephone Encounter (Signed)
Called to advise of results and rx, no answer, vm full.

## 2020-08-13 ENCOUNTER — Other Ambulatory Visit: Payer: Self-pay | Admitting: Family Medicine

## 2020-08-13 DIAGNOSIS — R300 Dysuria: Secondary | ICD-10-CM

## 2020-08-13 MED ORDER — AMPICILLIN 500 MG PO CAPS: 500.0000 mg | ORAL_CAPSULE | Freq: Three times a day (TID) | ORAL | 0 refills | Status: DC

## 2020-08-23 ENCOUNTER — Encounter: Payer: Self-pay | Admitting: Advanced Practice Midwife

## 2020-08-23 ENCOUNTER — Other Ambulatory Visit: Payer: Self-pay

## 2020-08-23 ENCOUNTER — Ambulatory Visit (INDEPENDENT_AMBULATORY_CARE_PROVIDER_SITE_OTHER): Payer: Managed Care, Other (non HMO) | Admitting: Advanced Practice Midwife

## 2020-08-23 VITALS — BP 127/88 | HR 97 | Wt 153.0 lb

## 2020-08-23 DIAGNOSIS — Z3009 Encounter for other general counseling and advice on contraception: Secondary | ICD-10-CM

## 2020-08-23 DIAGNOSIS — O10919 Unspecified pre-existing hypertension complicating pregnancy, unspecified trimester: Secondary | ICD-10-CM

## 2020-08-23 LAB — POCT URINE PREGNANCY: Preg Test, Ur: NEGATIVE

## 2020-08-23 MED ORDER — ETONOGESTREL 68 MG ~~LOC~~ IMPL: 68.0000 mg | DRUG_IMPLANT | Freq: Once | SUBCUTANEOUS | Status: DC

## 2020-08-23 NOTE — Progress Notes (Signed)
Post Partum Visit Note  Michelle Thompson is a 24 y.o. G49P2002 female who presents for a postpartum visit. She is 4 weeks postpartum following a Vaginal Delivery.  I have fully reviewed the prenatal and intrapartum course. The delivery was at 38gestational weeks.  Anesthesia: epidural. Postpartum course has been Unremarkable. Baby is doing well. Baby is feeding by Bottle Feeding Michelle Thompson Start . Bleeding no bleeding. Bowel function is normal. Bladder function is normal. Patient is not sexually active. Contraception method is none. Postpartum depression screening: negative. EPDS = 0    The pregnancy intention screening data noted above was reviewed. Potential methods of contraception were discussed. The patient elected to proceed with Hormonal Implant.    Edinburgh Postnatal Depression Scale - 08/23/20 1315      Edinburgh Postnatal Depression Scale:  In the Past 7 Days   I have been able to laugh and see the funny side of things. 0    I have looked forward with enjoyment to things. 0    I have blamed myself unnecessarily when things went wrong. 0    I have been anxious or worried for no good reason. 0    I have felt scared or panicky for no good reason. 0    Things have been getting on top of me. 0    I have been so unhappy that I have had difficulty sleeping. 0    I have felt sad or miserable. 0    I have been so unhappy that I have been crying. 0    The thought of harming myself has occurred to me. 0    Edinburgh Postnatal Depression Scale Total 0            The following portions of the patient's history were reviewed and updated as appropriate: allergies, current medications, past family history, past medical history, past social history, past surgical history and problem list.  Review of Systems Pertinent items noted in HPI and remainder of comprehensive ROS otherwise negative.    Objective:  Blood pressure 127/88, pulse 97, weight 153 lb (69.4 kg), not currently  breastfeeding. BP 127/88   Pulse 97   Wt 153 lb (69.4 kg)   Breastfeeding No   BMI 27.98 kg/m   VS reviewed, nursing note reviewed,  Constitutional: well developed, well nourished, no distress HEENT: normocephalic CV: normal rate Pulm/chest wall: normal effort Abdomen: soft Neuro: alert and oriented x 3 Skin: warm, dry Psych: affect normal    Nexplanon Insertion Procedure Patient identified, informed consent performed, consent signed.   Patient does understand that irregular bleeding is a very common side effect of this medication. She was advised to have backup contraception for one week after placement. Pregnancy test in clinic today was negative.  Appropriate time out taken.  Patient's left arm was prepped and draped in the usual sterile fashion.. The ruler used to measure and mark insertion area.  Patient was prepped with alcohol swab and then injected with 3 ml of 1% lidocaine.  She was prepped with betadine, Nexplanon removed from packaging,  Device confirmed in needle, then inserted full length of needle and withdrawn per handbook instructions. Nexplanon was able to palpated in the patient's arm; patient palpated the insert herself. There was minimal blood loss.  Patient insertion site covered with guaze and a pressure bandage to reduce any bruising.  The patient tolerated the procedure well and was given post procedure instructions.    Assessment:    1. Encounter for  counseling regarding contraception --Nexplanon placed at today's visit, see procedure note above.  - POCT urine pregnancy - etonogestrel (NEXPLANON) implant 68 mg  2. Postpartum examination following vaginal delivery --Pt treated for  UTI recently, wants to test for UTI again. - Urine Culture  3. Chronic hypertension affecting pregnancy --GHTN vs CHTN with some elevated BP in early pregnancy, then admission with HTN and headaches at 38 weeks.   --Norvasc 10 mg daily since discharge from  Hospital, BPs run  110s to 120s. --D/C medication and take BP daily with home cuff.  If elevated over 140/90, call office for follow up appt or see PCP.  Plan:   Essential components of care per ACOG recommendations:  1.  Mood and well being: Patient with negative depression screening today. Reviewed local resources for support.  - Patient does not use tobacco.  - hx of drug use? No   2. Infant care and feeding:  -Patient currently breastmilk feeding? No  -Social determinants of health (SDOH) reviewed in EPIC. No concerns  3. Sexuality, contraception and birth spacing - Patient does not want a pregnancy in the next year.   - Reviewed forms of contraception in tiered fashion. Patient desired Nexplanon today.   - Discussed birth spacing of 18 months  4. Sleep and fatigue -Encouraged family/partner/community support of 4 hrs of uninterrupted sleep to help with mood and fatigue  5. Physical Recovery  - Discussed patients delivery - Patient had no laceration/repair, perineal healing reviewed. Patient expressed understanding - Patient has urinary incontinence? No - Patient is safe to resume physical and sexual activity  6.  Health Maintenance - Pt was not 23 at beginning of pregnancy so Pap not done, not collected today. --Pap in next year   7. Chronic Disease --See above for HTN, pt to stop Norvasc today, take BPs at home. - PCP follow up  Sharen Counter, CNM Center for [employer], [facility]

## 2020-08-23 NOTE — Patient Instructions (Signed)
Nexplanon Instructions After Insertion  Keep bandage clean and dry for 24 hours  May use ice/Tylenol/Ibuprofen for soreness or pain  If you develop fever, drainage or increased warmth from incision site-contact office immediately   

## 2020-08-25 LAB — URINE CULTURE

## 2020-09-27 ENCOUNTER — Ambulatory Visit: Payer: Managed Care, Other (non HMO) | Admitting: Advanced Practice Midwife

## 2020-10-18 ENCOUNTER — Encounter: Payer: Self-pay | Admitting: Advanced Practice Midwife

## 2020-10-18 ENCOUNTER — Ambulatory Visit (INDEPENDENT_AMBULATORY_CARE_PROVIDER_SITE_OTHER): Payer: Managed Care, Other (non HMO) | Admitting: Advanced Practice Midwife

## 2020-10-18 ENCOUNTER — Other Ambulatory Visit: Payer: Self-pay

## 2020-10-18 VITALS — BP 118/77 | HR 89 | Wt 155.6 lb

## 2020-10-18 DIAGNOSIS — Z3009 Encounter for other general counseling and advice on contraception: Secondary | ICD-10-CM | POA: Diagnosis not present

## 2020-10-18 DIAGNOSIS — Z3046 Encounter for surveillance of implantable subdermal contraceptive: Secondary | ICD-10-CM

## 2020-10-18 MED ORDER — PHEXXI 1.8-1-0.4 % VA GEL: 5.0000 g | VAGINAL | 11 refills | Status: DC | PRN

## 2020-10-18 NOTE — Progress Notes (Signed)
Pt is here for nexplanon removal.   Nexplanon inserted 08/23/20.   Pt reports c/o mood swings and lethargy since nexplanon insertion, pt also reports vaginal spotting since insertion.

## 2020-10-18 NOTE — Progress Notes (Addendum)
° ° °  GYNECOLOGY CLINIC PROCEDURE NOTE  Denessa Cavan is a 24 y.o. [phone] here for Nexplanon removal.  Last pap smear was in 2017 and was normal. She reports mood swings and irregular bleeding with Nexplanon.  She reports mood changes 2 weeks after device was placed and just doesn't feel like herself.  Denies any thoughts of harming herself or others but does feel down/depressed. No other gynecologic concerns.   Nexplanon Removal Patient identified, informed consent performed, consent signed.   Appropriate time out taken. Nexplanon site identified.  Area prepped in usual sterile fashon. One ml of 1% lidocaine was used to anesthetize the area at the distal end of the implant. A small stab incision was made right beside the implant on the distal portion.  The Nexplanon rod was grasped using hemostats and removed without difficulty.  There was minimal blood loss. There were no complications.  3 ml of 1% lidocaine was injected around the incision for post-procedure analgesia.  Steri-strips were applied over the small incision.  A pressure bandage was applied to reduce any bruising.  The patient tolerated the procedure well and was given post procedure instructions.    A/P: 1. Encounter for counseling regarding contraception --Discussed LARCs as most effective forms of birth control.  Discussed benefits/risks of other methods.  Pt desires nonhormonal contraception. Reviewed options including natural family planning, barrier methods, Phexxi.  Pt desires to try Phexxi.  Samples given from office.  --Pap/annual and follow up contraceptive visit scheduled in 3 months  - Lactic Ac-Citric Ac-Pot Bitart (PHEXXI) 1.8-1-0.4 % GEL; Place 5 g vaginally as needed.  Dispense: 60 g; Refill: 11  2. Encounter for Nexplanon removal --Nexplanon removed without difficulty.  See procedure note.  Sharen Counter, CNM 9:01 AM

## 2020-10-18 NOTE — Patient Instructions (Signed)
Lactic acid; Citric acid; Potassium bitartrate vaginal gel What is this medicine? LACTIC ACID; CITRIC ACID; POTASSIUM BITARTRATE (LAK tuhk AS id; SIH trik AS id; poe TASS ee um bi TAAR treit) is used for birth control when you have sexual intercourse to help prevent pregnancy. This medicine may be used for other purposes; ask your health care provider or pharmacist if you have questions. COMMON BRAND NAME(S): PHEXXI What should I tell my health care provider before I take this medicine? They need to know if you have any of these conditions:  frequent kidney or urinary tract infections  HIV or AIDS  pelvic or vaginal infection  sexually transmitted disease, like herpes, gonorrhea, or chlamydia  an unusual or allergic reaction to lactic acid, citric acid, potassium bitartrate, other medicines, foods, dyes, or preservatives  pregnant or trying to get pregnant  breast-feeding How should I use this medicine? This medicine is for use in the vagina only. Follow the directions on the prescription label. Wash hands before and after use. To prevent pregnancy, it is very important that this product is used properly. This product must be applied before sexual contact begins. Do not use it in the rectum. Make sure you carefully read and follow the instructions that come with the product. If you have vaginal sex more than 1 time within 1 hour, you must use a new applicator. Use exactly as directed. Talk to your pediatrician regarding the use of this medicine in children. Special care may be needed. Overdosage: If you think you have taken too much of this medicine contact a poison control center or emergency room at once. NOTE: This medicine is only for you. Do not share this medicine with others. What if I miss a dose? Use before each time you have sexual intercourse as directed on the label. If you miss a dose, you may become pregnant. What may interact with this medicine? Interactions are not  expected. This birth control may be used with other medicines used in the vagina to treat infections including miconazole, metronidazole, and tioconazole. If you have questions ask your doctor or pharmacist. Do not use any other vaginal products at the same time without talking to your health care professional. This list may not describe all possible interactions. Give your health care provider a list of all the medicines, herbs, non-prescription drugs, or dietary supplements you use. Also tell them if you smoke, drink alcohol, or use illegal drugs. Some items may interact with your medicine. What should I watch for while using this medicine? This product does not protect you against HIV infection (AIDS) or other sexually transmitted diseases. This product may be used at any time of your menstrual cycle. This product may be used as soon as your healthcare provider tells you it is safe for you to have sex after childbirth, abortion, or miscarriage. This product may be used with most hormonal birth control methods; a vaginal diaphragm; or latex, polyurethane and polyisoprene condoms. Avoid using this product with contraceptive vaginal rings. What side effects may I notice from receiving this medicine? Side effects that you should report to your doctor or health care professional as soon as possible:  allergic reactions like skin rash, itching or hives, swelling  signs and symptoms of infection like fever; chills; pelvic pain; cloudy urine; pain or trouble passing urine  vaginal discharge, itching or odor  vaginal irritation or burning Side effects that usually do not require medical attention (report these to your doctor or health care professional   if they continue or are bothersome):  mild vaginal irritation This list may not describe all possible side effects. Call your doctor for medical advice about side effects. You may report side effects to FDA at 1-800-FDA-1088. Where should I keep my  medicine? Keep out of the reach of children. Store at room temperature between 15 and 30 degrees C (59 and 86 degrees F). Keep in the foil pouch until ready for use. Follow the directions on the product label. Throw away any unused medicine after the expiration date. NOTE: This sheet is a summary. It may not cover all possible information. If you have questions about this medicine, talk to your doctor, pharmacist, or health care provider.  2020 Elsevier/Gold Standard (2019-05-28 09:34:36)  

## 2020-10-31 ENCOUNTER — Ambulatory Visit: Payer: Managed Care, Other (non HMO) | Admitting: Obstetrics & Gynecology

## 2020-11-14 ENCOUNTER — Encounter: Payer: Self-pay | Admitting: Obstetrics and Gynecology

## 2020-11-14 ENCOUNTER — Ambulatory Visit (INDEPENDENT_AMBULATORY_CARE_PROVIDER_SITE_OTHER): Payer: Managed Care, Other (non HMO) | Admitting: Obstetrics and Gynecology

## 2020-11-14 ENCOUNTER — Other Ambulatory Visit: Payer: Self-pay

## 2020-11-14 VITALS — BP 130/84 | HR 104 | Ht 62.0 in | Wt 161.0 lb

## 2020-11-14 DIAGNOSIS — Z3201 Encounter for pregnancy test, result positive: Secondary | ICD-10-CM | POA: Diagnosis not present

## 2020-11-14 DIAGNOSIS — Q831 Accessory breast: Secondary | ICD-10-CM | POA: Diagnosis not present

## 2020-11-14 LAB — POCT URINE PREGNANCY: Preg Test, Ur: POSITIVE — AB

## 2020-11-14 NOTE — Progress Notes (Signed)
24 yp P2 presenting today for the evaluation of a lump under left arm. Patient reports noticing this lump in April while she was still pregnant. The lump grew larger and at times was tender. Patient is not currently breastfeeding. Patient states that over the course of this past week the lump has significantly decreased in size and is no linger bothersome. Patient is sexually active using phexxi for contraception. Patient reports recent onset of nausea and fatigue with positive pregnancy test 2 days ago. Patient admits that this was not a planned pregnancy but considers it a blessing. 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 Patient is without any other complaints  Past Medical History:  Diagnosis Date   Hypertension    Past Surgical History:  Procedure Laterality Date   NO PAST SURGERIES     Family History  Problem Relation Age of Onset   Lupus Mother    Healthy Father    Asthma Brother    Diabetes Maternal Grandmother    COPD Maternal Grandmother    Diabetes Maternal Grandfather    Stroke Maternal Grandfather    Social History   Tobacco Use   Smoking status: Never Smoker   Smokeless tobacco: Never Used  Building services engineer Use: Never used  Substance Use Topics   Alcohol use: No   Drug use: No   ROS See pertinent in HPI. All other systems reviewed and negative  Blood pressure 130/84, pulse (!) 104, height 5\' 2"  (1.575 m), weight 161 lb (73 kg), last menstrual period 10/18/2020, not currently breastfeeding. GENERAL: Well-developed, well-nourished female in no acute distress.  BREASTS: Symmetric in size. No palpable masses or lymphadenopathy, skin changes, or nipple drainage. Bilateral accessory breast lobes palpated in axilla, more prominent on left side. Both non tender ABDOMEN: Soft, nontender, nondistended. No organomegaly. EXTREMITIES: No cyanosis, clubbing, or edema, 2+ distal pulses.  UPT positive  A/P 24 yo here with breast tissue in axilla  and confirmed pregnant - Reassurance provided regarding physical findings - Patient was extremely anxious to discuss new pregnancy as she recently delivered in 06/2020.  - Advised patient to take prenatal vitamins - Follow up for new ob with pap smear - Patient will be scheduled for new ob intake

## 2020-11-14 NOTE — Progress Notes (Signed)
Pt reports that she had lump under her left arm that developed around April and began getting bigger, pt states it recently decreased in size last week. Pt reports the lump is tender.

## 2020-11-29 ENCOUNTER — Inpatient Hospital Stay (HOSPITAL_COMMUNITY)
Admission: AD | Admit: 2020-11-29 | Discharge: 2020-11-30 | Disposition: A | Discharge status: Still In Hospital | Payer: Managed Care, Other (non HMO) | Attending: Obstetrics & Gynecology | Admitting: Obstetrics & Gynecology

## 2020-11-29 ENCOUNTER — Other Ambulatory Visit: Payer: Self-pay

## 2020-11-29 ENCOUNTER — Encounter (HOSPITAL_COMMUNITY): Payer: Self-pay | Admitting: Obstetrics & Gynecology

## 2020-11-29 DIAGNOSIS — O26891 Other specified pregnancy related conditions, first trimester: Secondary | ICD-10-CM | POA: Insufficient documentation

## 2020-11-29 DIAGNOSIS — T63301A Toxic effect of unspecified spider venom, accidental (unintentional), initial encounter: Secondary | ICD-10-CM | POA: Diagnosis not present

## 2020-11-29 DIAGNOSIS — Z3A01 Less than 8 weeks gestation of pregnancy: Secondary | ICD-10-CM | POA: Insufficient documentation

## 2020-11-29 NOTE — MAU Note (Signed)
Got bit about 4 days ago, got worse and worse.  Got bigger and bigger (area is red, warm to touch), thigh is hurting and there is pus draining from it. Has been taking Benadryl.

## 2020-11-29 NOTE — MAU Provider Note (Signed)
First Provider Initiated Contact with Patient 11/29/20 1849      S [name] is a 24 y.o. G3P2002 at [redacted]w[redacted]d patient who presents to MAU today with complaint of spider bite. Patient reports she got bit by a spider 4 days ago, and since that time the area has become red, swollen, tender enlarging and draining pus. Patient denies any pregnancy related concerns including abdominal or pelvic pain and bleeding. Patient reports she called the nurse line for her office and she was told to come to MAU for evaluation.  O BP 138/84 (BP Location: Right Arm)   Pulse 79   Temp 98.5 F (36.9 C) (Oral)   Resp 16   Ht 5\' 2"  (1.575 m)   Wt 74.5 kg   LMP 10/15/2020   SpO2 100%   BMI 30.05 kg/m    Physical Exam Vitals and nursing note reviewed.  Constitutional:      General: She is not in acute distress.    Appearance: Normal appearance. She is not ill-appearing, toxic-appearing or diaphoretic.  HENT:     Head: Normocephalic and atraumatic.  Pulmonary:     Effort: Pulmonary effort is normal.  Neurological:     Mental Status: She is alert and oriented to person, place, and time.  Psychiatric:        Mood and Affect: Mood normal.        Behavior: Behavior normal.        Thought Content: Thought content normal.        Judgment: Judgment normal.    A Medical screening exam complete Spider Bite  P Discharge from MAU in stable condition Report called to [facility], [facility] in the ED Patient transferred to [facility] for further evaluation Warning signs for worsening condition that would warrant emergency follow-up discussed Patient may return to MAU as needed   Sanaiya Welliver, [facility], NP 11/29/2020 7:04 PM

## 2020-11-29 NOTE — MAU Note (Signed)
Transporter arrived to take pt to ER via wc

## 2020-11-30 ENCOUNTER — Ambulatory Visit: Payer: Self-pay

## 2020-12-11 ENCOUNTER — Other Ambulatory Visit: Payer: Self-pay

## 2020-12-11 ENCOUNTER — Encounter (HOSPITAL_COMMUNITY): Payer: Self-pay | Admitting: Obstetrics and Gynecology

## 2020-12-11 ENCOUNTER — Inpatient Hospital Stay (HOSPITAL_COMMUNITY)
Admission: AD | Admit: 2020-12-11 | Discharge: 2020-12-12 | Disposition: A | Discharge status: Home / Self Care | Payer: Managed Care, Other (non HMO) | Attending: Obstetrics and Gynecology | Admitting: Obstetrics and Gynecology

## 2020-12-11 DIAGNOSIS — O219 Vomiting of pregnancy, unspecified: Secondary | ICD-10-CM

## 2020-12-11 DIAGNOSIS — Z3A08 8 weeks gestation of pregnancy: Secondary | ICD-10-CM | POA: Insufficient documentation

## 2020-12-11 LAB — URINALYSIS, ROUTINE W REFLEX MICROSCOPIC
Bacteria, UA: NONE SEEN
Bilirubin Urine: NEGATIVE
Glucose, UA: NEGATIVE mg/dL
Hgb urine dipstick: NEGATIVE
Ketones, ur: 80 mg/dL — AB
Leukocytes,Ua: NEGATIVE
Nitrite: NEGATIVE
Protein, ur: 100 mg/dL — AB
Specific Gravity, Urine: 1.031 — ABNORMAL HIGH (ref 1.005–1.030)
pH: 5 (ref 5.0–8.0)

## 2020-12-11 MED ORDER — FAMOTIDINE IN NACL 20-0.9 MG/50ML-% IV SOLN
20.0000 mg | Freq: Once | INTRAVENOUS | Status: AC
  Administered 2020-12-11: 20 mg via INTRAVENOUS
  Filled 2020-12-11: qty 50

## 2020-12-11 MED ORDER — SODIUM CHLORIDE 0.9 % IV SOLN
25.0000 mg | Freq: Once | INTRAVENOUS | Status: AC
  Administered 2020-12-11: 25 mg via INTRAVENOUS
  Filled 2020-12-11: qty 1

## 2020-12-11 MED ORDER — LACTATED RINGERS IV BOLUS
1000.0000 mL | Freq: Once | INTRAVENOUS | Status: AC
  Administered 2020-12-11: 1000 mL via INTRAVENOUS

## 2020-12-11 MED ORDER — SODIUM CHLORIDE 0.9 % IV SOLN
8.0000 mg | Freq: Once | INTRAVENOUS | Status: DC
  Filled 2020-12-11: qty 4

## 2020-12-11 MED ORDER — ONDANSETRON HCL 4 MG/2ML IJ SOLN
4.0000 mg | Freq: Once | INTRAMUSCULAR | Status: AC
  Administered 2020-12-11: 4 mg via INTRAVENOUS
  Filled 2020-12-11: qty 2

## 2020-12-11 NOTE — MAU Provider Note (Signed)
Chief Complaint:  Emesis   Event Date/Time   First Provider Initiated Contact with Patient 12/11/20 2110     HPI: Michelle Thompson is a 24 y.o. G3P2002 at [redacted]w[redacted]d who presents to maternity admissions reporting nausea and vomiting that has gotten significantly worse over the past two days. Spoke to the office [facility]) and has tried everything suggested, including the few Zofran she was initially prescribed. Zofran helped, nothing else has and she hasn't been able to keep down food or liquids for 48hrs. Starting to feel light-headed, weak, and on the verge of passing out. Reports one episode of sharp epigastric pain after emesis, but it subsided and has not returned. Denies vaginal bleeding, fever, falls, or recent illness.   Past Medical History:  Diagnosis Date  . Hypertension    Gestational Hypertension   OB History  Gravida Para Term Preterm AB Living  3 2 2     2   SAB IAB Ectopic Multiple Live Births        0 2    # Outcome Date GA Lbr Len/2nd Weight Sex Delivery Anes PTL Lv  3 Current           2 Term 07/20/20 [redacted]w[redacted]d 11:50 / 00:07 6 lb 6.5 oz (2.906 kg) M Vag-Spont EPI  LIV     Birth Comments:  wnl  1 Term 01/14/16    M Vag-Spont   LIV   Past Surgical History:  Procedure Laterality Date  . NO PAST SURGERIES     Family History  Problem Relation Age of Onset  . Lupus Mother   . Healthy Father   . Asthma Brother   . Diabetes Maternal Grandmother   . COPD Maternal Grandmother   . Diabetes Maternal Grandfather   . Stroke Maternal Grandfather    Social History   Tobacco Use  . Smoking status: Never Smoker  . Smokeless tobacco: Never Used  Vaping Use  . Vaping Use: Never used  Substance Use Topics  . Alcohol use: No  . Drug use: No   Allergies  Allergen Reactions  . Fentanyl Shortness Of Breath and Itching    Questionable SOB after IV Fentanyl   No medications prior to admission.    I have reviewed patient's Past Medical Hx, Surgical Hx, Family Hx, Social Hx,  medications and allergies.   ROS:  Review of Systems  Constitutional: Positive for fatigue. Negative for fever.  HENT: Negative for congestion and sore throat.   Eyes: Negative for visual disturbance.  Respiratory: Negative for cough and shortness of breath.   Cardiovascular: Negative for chest pain.  Gastrointestinal: Positive for abdominal pain (one episode of epigastric cramping after emesis), nausea and vomiting.  Genitourinary: Negative for flank pain, vaginal bleeding, vaginal discharge and vaginal pain.  Neurological: Positive for light-headedness. Negative for headaches.  All other systems reviewed and are negative.   Physical Exam   Patient Vitals for the past 24 hrs:  BP Temp Temp src Pulse Resp SpO2 Height Weight  12/12/20 0210 118/71 98.6 F (37 C) Oral (!) 102 19 -- -- --  12/11/20 2056 124/72 -- -- 84 19 -- -- --  12/11/20 2018 126/83 98.9 F (37.2 C) Oral 92 18 100 % 5\' 2"  (1.575 m) 157 lb 11.2 oz (71.5 kg)  12/11/20 2017 -- -- -- -- -- 100 % -- --   Constitutional: Well-developed, well-nourished female in no acute distress.  Cardiovascular: normal rate & rhythm, no murmur Respiratory: normal effort, lung sounds clear throughout GI: Abd  soft, non-tender, gravid appropriate for gestational age. Pos BS x 4 MS: Extremities nontender, no edema, normal ROM Neurologic: Alert and oriented x 4.  GU: no CVA tenderness Pelvic exam deferred   Labs: Results for orders placed or performed during the hospital encounter of 12/11/20 (from the past 24 hour(s))  Urinalysis, Routine w reflex microscopic Urine, Clean Catch     Status: Abnormal   Collection Time: 12/11/20  8:26 PM  Result Value Ref Range   Color, Urine AMBER (A) YELLOW   APPearance HAZY (A) CLEAR   Specific Gravity, Urine 1.031 (H) 1.005 - 1.030   pH 5.0 5.0 - 8.0   Glucose, UA NEGATIVE NEGATIVE mg/dL   Hgb urine dipstick NEGATIVE NEGATIVE   Bilirubin Urine NEGATIVE NEGATIVE   Ketones, ur 80 (A) NEGATIVE  mg/dL   Protein, ur 782 (A) NEGATIVE mg/dL   Nitrite NEGATIVE NEGATIVE   Leukocytes,Ua NEGATIVE NEGATIVE   RBC / HPF 0-5 0 - 5 RBC/hpf   WBC, UA 0-5 0 - 5 WBC/hpf   Bacteria, UA NONE SEEN NONE SEEN   Squamous Epithelial / LPF 11-20 0 - 5   Mucus PRESENT    Amorphous Crystal PRESENT    Imaging:  No results found.  MAU Course: Orders Placed This Encounter  Procedures  . Urinalysis, Routine w reflex microscopic Urine, Clean Catch  . Discharge patient   Meds ordered this encounter  Medications  . promethazine (PHENERGAN) 25 mg in sodium chloride 0.9 % 1,000 mL infusion  . DISCONTD: ondansetron (ZOFRAN) 8 mg in sodium chloride 0.9 % 50 mL IVPB  . ondansetron (ZOFRAN) injection 4 mg  . lactated ringers bolus 1,000 mL  . famotidine (PEPCID) IVPB 20 mg premix  . Doxylamine-Pyridoxine (DICLEGIS) 10-10 MG TBEC    Sig: Take 1-2 tablets by mouth at bedtime. Start by taking two tablets at bedtime on day 1 and 2; if symptoms persist, take 1 tablet in morning and 2 tablets at bedtime on day 3; if symptoms persist, may increase to 1 tablet in morning, 1 tablet mid-afternoon, and 2 tablets at bedtime on day 4 (maximum: doxylamine 40 mg/pyridoxine 40 mg (4 tablets) per day).    Dispense:  60 tablet    Refill:  2    Order Specific Question:   Supervising Provider    Answer:   Reva Bores [[id number]]  . ondansetron (ZOFRAN ODT) 8 MG disintegrating tablet    Sig: Take 1 tablet (8 mg total) by mouth every 8 (eight) hours as needed for nausea or vomiting.    Dispense:  20 tablet    Refill:  0    Order Specific Question:   Supervising Provider    Answer:   Reva Bores [[id number]]  . famotidine (PEPCID) 10 MG tablet    Sig: Take 1 tablet (10 mg total) by mouth 2 (two) times daily.    Dispense:  60 tablet    Refill:  1    Order Specific Question:   Supervising Provider    Answer:   Reva Bores [[id number]]   MDM: LR bolus + phenergan infusion and zofran IV - partial relief of nausea Added second bolus  and pepcid IVPB with complete relief of nausea, pt able to tolerate graham crackers and ginger ale.  Assessment: 1. Nausea and vomiting during pregnancy prior to [redacted] weeks gestation    Plan: Discharge home in stable condition with return precautions.    Follow-up Information    [facility].  Go to.   Specialty: Obstetrics and Gynecology Why: as scheduled for ongoing prenatal care Contact information: [address], [address] [place] [address] [phone]              Allergies as of 12/12/2020      Reactions   Fentanyl Shortness Of Breath, Itching   Questionable SOB after IV Fentanyl      Medication List    STOP taking these medications   acetaminophen 325 MG tablet Commonly known as: Tylenol   amLODipine 10 MG tablet Commonly known as: NORVASC   ampicillin 500 MG capsule Commonly known as: PRINCIPEN   ibuprofen 600 MG tablet Commonly known as: ADVIL   nitrofurantoin (macrocrystal-monohydrate) 100 MG capsule Commonly known as: MACROBID   Phexxi 1.8-1-0.4 % Gel Generic drug: Lactic Ac-Citric Ac-Pot Bitart   VITAMIN B 12 PO     TAKE these medications   Doxylamine-Pyridoxine 10-10 MG Tbec Commonly known as: Diclegis Take 1-2 tablets by mouth at bedtime. Start by taking two tablets at bedtime on day 1 and 2; if symptoms persist, take 1 tablet in morning and 2 tablets at bedtime on day 3; if symptoms persist, may increase to 1 tablet in morning, 1 tablet mid-afternoon, and 2 tablets at bedtime on day 4 (maximum: doxylamine 40 mg/pyridoxine 40 mg (4 tablets) per day).   famotidine 10 MG tablet Commonly known as: PEPCID Take 1 tablet (10 mg total) by mouth 2 (two) times daily.   ondansetron 8 MG disintegrating tablet Commonly known as: Zofran ODT Take 1 tablet (8 mg total) by mouth every 8 (eight) hours as needed for nausea or vomiting.   prenatal multivitamin Tabs tablet Take 1 tablet by mouth daily at 12  noon.      Edd Arbour, CNM, MSN, [facility] 12/12/20 4:15 AM

## 2020-12-11 NOTE — MAU Note (Signed)
Pt has had n/v for a couple weeks, worsening over the last two days. Tried OTC meds with no help. Took zofran a couple of days which helped but office didn't recommend taking. Feels dehydrated and like passing out.

## 2020-12-12 DIAGNOSIS — O219 Vomiting of pregnancy, unspecified: Secondary | ICD-10-CM | POA: Diagnosis not present

## 2020-12-12 DIAGNOSIS — Z3A08 8 weeks gestation of pregnancy: Secondary | ICD-10-CM

## 2020-12-12 MED ORDER — DOXYLAMINE-PYRIDOXINE 10-10 MG PO TBEC: 1.0000 | DELAYED_RELEASE_TABLET | Freq: Every day | ORAL | 2 refills | Status: DC

## 2020-12-12 MED ORDER — ONDANSETRON 8 MG PO TBDP
8.0000 mg | ORAL_TABLET | Freq: Three times a day (TID) | ORAL | 0 refills | Status: DC | PRN
Start: 2020-12-12 — End: 2021-01-31

## 2020-12-12 MED ORDER — FAMOTIDINE 10 MG PO TABS
10.0000 mg | ORAL_TABLET | Freq: Two times a day (BID) | ORAL | 1 refills | Status: DC
Start: 2020-12-12 — End: 2021-01-17

## 2020-12-12 NOTE — Discharge Instructions (Signed)
Start by taking the pepcid in the morning, diclegis + pepcid at night, and zofran IF needed for breakthrough nausea/vomiting.   Morning Sickness  Morning sickness is when you feel sick to your stomach (nauseous) during pregnancy. You may feel sick to your stomach and throw up (vomit). You may feel sick in the morning, but you can feel this way at any time of day. Some women feel very sick to their stomach and cannot stop throwing up (hyperemesis gravidarum). Follow these instructions at home: Medicines  Take over-the-counter and prescription medicines only as told by your doctor. Do not take any medicines until you talk with your doctor about them first.  Taking multivitamins before getting pregnant can stop or lessen the harshness of morning sickness. Eating and drinking  Eat dry toast or crackers before getting out of bed.  Eat 5 or 6 small meals a day.  Eat dry and bland foods like rice and baked potatoes.  Do not eat greasy, fatty, or spicy foods.  Have someone cook for you if the smell of food causes you to feel sick or throw up.  If you feel sick to your stomach after taking prenatal vitamins, take them at night or with a snack.  Eat protein when you need a snack. Nuts, yogurt, and cheese are good choices.  Drink fluids throughout the day.  Try ginger ale made with real ginger, ginger tea made from fresh grated ginger, or ginger candies. General instructions  Do not use any products that have nicotine or tobacco in them, such as cigarettes and e-cigarettes. If you need help quitting, ask your doctor.  Use an air purifier to keep the air in your house free of smells.  Get lots of fresh air.  Try to avoid smells that make you feel sick.  Try: ? Wearing a bracelet that is used for seasickness (acupressure wristband). ? Going to a doctor who puts thin needles into certain body points (acupuncture) to improve how you feel. Contact a doctor if:  You need medicine to feel  better.  You feel dizzy or light-headed.  You are losing weight. Get help right away if:  You feel very sick to your stomach and cannot stop throwing up.  You pass out (faint).  You have very bad pain in your belly. Summary  Morning sickness is when you feel sick to your stomach (nauseous) during pregnancy.  You may feel sick in the morning, but you can feel this way at any time of day.  Making some changes to what you eat may help your symptoms go away. This information is not intended to replace advice given to you by your health care provider. Make sure you discuss any questions you have with your health care provider. Document Revised: 11/29/2017 Document Reviewed: 01/17/2017 Elsevier Patient Education  2020 [employer].

## 2020-12-31 NOTE — L&D Delivery Note (Addendum)
OB/GYN Faculty Practice Delivery Note  Michelle Thompson is a 25 y.o. [id number] s/p VD at [redacted]w[redacted]d. She was admitted for IOL 2/2 cHTN.   ROM: 3h 60m with clear fluid GBS Status: pos , adequate prophylaxis  Maximum Maternal Temperature: 98.47F   Labor Progress: Initial SVE: 3/50/-2. She then progressed to complete.   Delivery Date/Time: 1600 on 07/10/21 Delivery: Called to room and patient was complete and pushing. Head delivered OA. Body cord present, delivered through Shoulder and body delivered in usual fashion. Infant with spontaneous cry, placed on mother's abdomen, dried and stimulated. Cord clamped x 2 after 1-minute delay, and cut by FOB. Cord blood drawn. Placenta delivered spontaneously with gentle cord traction. Fundus firm with massage and Pitocin. Labia, perineum, vagina, and cervix inspected inspected with no lacerations  Baby Weight: pending  Placenta: Normal insertion, 3 vessel cord, intact, Sent to L&D Complications: None Lacerations: none EBL: 100 mL Analgesia: PRN  Infant:  APGAR (1 MIN):  9 APGAR (5 MINS): 9  Araceli Bouche MD PGY3  GME ATTESTATION:  I saw and evaluated the patient. I agree with the findings and the plan of care as documented in the resident's note. I was gloved for the entirety of the procedure.  Alric Seton, MD OB Fellow, Faculty [facility], [facility] 07/10/2021 4:44 PM

## 2021-01-02 ENCOUNTER — Ambulatory Visit (INDEPENDENT_AMBULATORY_CARE_PROVIDER_SITE_OTHER): Payer: Managed Care, Other (non HMO)

## 2021-01-02 ENCOUNTER — Other Ambulatory Visit: Payer: Self-pay

## 2021-01-02 VITALS — BP 121/86 | HR 89 | Ht 62.0 in | Wt 159.2 lb

## 2021-01-02 DIAGNOSIS — Z3491 Encounter for supervision of normal pregnancy, unspecified, first trimester: Secondary | ICD-10-CM | POA: Diagnosis not present

## 2021-01-02 DIAGNOSIS — Z789 Other specified health status: Secondary | ICD-10-CM | POA: Diagnosis not present

## 2021-01-02 DIAGNOSIS — Z3A10 10 weeks gestation of pregnancy: Secondary | ICD-10-CM

## 2021-01-02 DIAGNOSIS — O0990 Supervision of high risk pregnancy, unspecified, unspecified trimester: Secondary | ICD-10-CM | POA: Insufficient documentation

## 2021-01-02 DIAGNOSIS — O3680X0 Pregnancy with inconclusive fetal viability, not applicable or unspecified: Secondary | ICD-10-CM

## 2021-01-02 NOTE — Progress Notes (Signed)
PRENATAL INTAKE SUMMARY  Ms. Solar presents today New OB Nurse Interview.  OB History    Gravida  3   Para  2   Term  2   Preterm      AB      Living  2     SAB      IAB      Ectopic      Multiple  0   Live Births  2          I have reviewed the patient's medical, obstetrical, social, and family histories, medications, and available lab results.  SUBJECTIVE She has no unusual complaints  OBJECTIVE Initial Physical Exam (New OB)  GENERAL APPEARANCE: alert, well appearing   ASSESSMENT Normal pregnancy  PLAN Prenatal care to be [facility] All New OB labs to be completed at [facility] provider visit. PHQ2: 0 GAD 7 score: 0 Baby scripts ordered BP cuff given  U/S performed today reveals [redacted]w[redacted]d by CRL. FHR 158.

## 2021-01-06 ENCOUNTER — Telehealth: Payer: Self-pay

## 2021-01-06 NOTE — Telephone Encounter (Signed)
Received several message from patient. She will be starting prenatal care in our office and was given diclegis for nausea and vomiting. This medication is approved with her medicaid for brand name only. Received message from patient- she reports the pharmacy will need to run the medication using her [employer]. I do not have a card on file to do this. Call her to notiity her phone went to voice mail.

## 2021-01-09 ENCOUNTER — Other Ambulatory Visit (HOSPITAL_COMMUNITY)
Admission: RE | Admit: 2021-01-09 | Discharge: 2021-01-09 | Disposition: A | Discharge status: Home / Self Care | Payer: Managed Care, Other (non HMO) | Source: Ambulatory Visit | Attending: Advanced Practice Midwife | Admitting: Advanced Practice Midwife

## 2021-01-09 ENCOUNTER — Other Ambulatory Visit: Payer: Self-pay

## 2021-01-09 ENCOUNTER — Telehealth: Payer: Self-pay

## 2021-01-09 ENCOUNTER — Ambulatory Visit (INDEPENDENT_AMBULATORY_CARE_PROVIDER_SITE_OTHER): Payer: Managed Care, Other (non HMO) | Admitting: Advanced Practice Midwife

## 2021-01-09 VITALS — BP 129/81 | HR 85 | Wt 161.0 lb

## 2021-01-09 DIAGNOSIS — Z3A12 12 weeks gestation of pregnancy: Secondary | ICD-10-CM | POA: Insufficient documentation

## 2021-01-09 DIAGNOSIS — O219 Vomiting of pregnancy, unspecified: Secondary | ICD-10-CM | POA: Diagnosis not present

## 2021-01-09 DIAGNOSIS — L709 Acne, unspecified: Secondary | ICD-10-CM

## 2021-01-09 DIAGNOSIS — I10 Essential (primary) hypertension: Secondary | ICD-10-CM

## 2021-01-09 DIAGNOSIS — Z8759 Personal history of other complications of pregnancy, childbirth and the puerperium: Secondary | ICD-10-CM | POA: Insufficient documentation

## 2021-01-09 DIAGNOSIS — O10911 Unspecified pre-existing hypertension complicating pregnancy, first trimester: Secondary | ICD-10-CM | POA: Diagnosis not present

## 2021-01-09 DIAGNOSIS — O10919 Unspecified pre-existing hypertension complicating pregnancy, unspecified trimester: Secondary | ICD-10-CM

## 2021-01-09 DIAGNOSIS — Z3480 Encounter for supervision of other normal pregnancy, unspecified trimester: Secondary | ICD-10-CM

## 2021-01-09 DIAGNOSIS — Z3481 Encounter for supervision of other normal pregnancy, first trimester: Secondary | ICD-10-CM | POA: Diagnosis present

## 2021-01-09 MED ORDER — PROMETHAZINE HCL 12.5 MG PO TABS: 12.5000 mg | ORAL_TABLET | Freq: Every evening | ORAL | 0 refills | Status: DC | PRN

## 2021-01-09 MED ORDER — CLINDAMYCIN PHOS-BENZOYL PEROX 1-5 % EX GEL: Freq: Two times a day (BID) | CUTANEOUS | 0 refills | Status: DC

## 2021-01-09 MED ORDER — METOCLOPRAMIDE HCL 10 MG PO TABS: 10.0000 mg | ORAL_TABLET | Freq: Three times a day (TID) | ORAL | 2 refills | Status: DC | PRN

## 2021-01-09 NOTE — Progress Notes (Signed)
Subjective:   Michelle Thompson is a 25 y.o. G3P2002 at [redacted]w[redacted]d by LMP being seen today for her first obstetrical visit.  Her obstetrical history is significant for pregnancy induced hypertension and anemia and short interval pregnancy and has Gestational hypertension; Decreased fetal movement; Chronic hypertension affecting pregnancy; Uterine irritability; Headache in pregnancy, antepartum, third trimester; Pelvic pain in pregnancy, antepartum, third trimester; Anemia of pregnancy in third trimester; Preeclampsia complicating hypertension; Positive GBS test; and Supervision of high risk pregnancy, antepartum on their problem list.. Patient does not intend to breast feed. Pregnancy history fully reviewed.  Patient reports fatigue, nausea, no bleeding, no contractions, no cramping and no leaking.  HISTORY: OB History  Gravida Para Term Preterm AB Living  3 2 2  0 0 2  SAB IAB Ectopic Multiple Live Births  0 0 0 0 2    # Outcome Date GA Lbr Len/2nd Weight Sex Delivery Anes PTL Lv  3 Current           2 Term 07/20/20 [redacted]w[redacted]d 11:50 / 00:07 2906 g M Vag-Spont EPI  LIV     Birth Comments:  wnl     Name: Antilla,BOY Yalanda     Apgar1: 8  Apgar5: 9  1 Term 01/14/16    M Vag-Spont   LIV   Past Medical History:  Diagnosis Date  . Hypertension    Gestational Hypertension   Past Surgical History:  Procedure Laterality Date  . NO PAST SURGERIES     Family History  Problem Relation Age of Onset  . Lupus Mother   . Healthy Father   . Asthma Brother   . Diabetes Maternal Grandmother   . COPD Maternal Grandmother   . Diabetes Maternal Grandfather   . Stroke Maternal Grandfather    Social History   Tobacco Use  . Smoking status: Never Smoker  . Smokeless tobacco: Never Used  Vaping Use  . Vaping Use: Never used  Substance Use Topics  . Alcohol use: No  . Drug use: No   Allergies  Allergen Reactions  . Fentanyl Shortness Of Breath and Itching    Questionable SOB after IV Fentanyl    Current Outpatient Medications on File Prior to Visit  Medication Sig Dispense Refill  . Doxylamine-Pyridoxine (DICLEGIS) 10-10 MG TBEC Take 1-2 tablets by mouth at bedtime. Start by taking two tablets at bedtime on day 1 and 2; if symptoms persist, take 1 tablet in morning and 2 tablets at bedtime on day 3; if symptoms persist, may increase to 1 tablet in morning, 1 tablet mid-afternoon, and 2 tablets at bedtime on day 4 (maximum: doxylamine 40 mg/pyridoxine 40 mg (4 tablets) per day). (Patient not taking: Reported on 01/02/2021) 60 tablet 2  . famotidine (PEPCID) 10 MG tablet Take 1 tablet (10 mg total) by mouth 2 (two) times daily. (Patient not taking: Reported on 01/09/2021) 60 tablet 1  . ondansetron (ZOFRAN ODT) 8 MG disintegrating tablet Take 1 tablet (8 mg total) by mouth every 8 (eight) hours as needed for nausea or vomiting. 20 tablet 0  . Prenatal Vit-Fe Fumarate-FA (PRENATAL MULTIVITAMIN) TABS tablet Take 1 tablet by mouth daily at 12 noon.     No current facility-administered medications on file prior to visit.   Indications for ASA therapy (per uptodate) One of the following: Previous pregnancy with preeclampsia, especially early onset and with an adverse outcome No Multifetal gestation No Chronic hypertension No Type 1 or 2 diabetes mellitus No Chronic kidney disease No Autoimmune  disease (antiphospholipid syndrome, systemic lupus erythematosus) No  Two or more of the following: Nulliparity No Obesity (body mass index >30 kg/m2) No Family history of preeclampsia in mother or sister No Age ?35 years No Sociodemographic characteristics (African American race, low socioeconomic level) Yes Personal risk factors (eg, previous pregnancy with low birth weight or small for gestational age infant, previous adverse pregnancy outcome [eg, stillbirth], interval >10 years between pregnancies) No  Indications for early 1 hour GTT (per uptodate)  BMI >25 (>23 in Asian women) AND one of  the following  Gestational diabetes mellitus in a previous pregnancy No Glycated hemoglobin ?5.7 percent (39 mmol/mol), impaired glucose tolerance, or impaired fasting glucose on previous testing No First-degree relative with diabetes No High-risk race/ethnicity (eg, African American, Latino, Native American, [place] American, Pacific Islander) Yes History of cardiovascular disease No Hypertension or on therapy for hypertension No High-density lipoprotein cholesterol level <35 mg/dL (5.64 mmol/L) and/or a triglyceride level >250 mg/dL (3.32 mmol/L) No Polycystic ovary syndrome No Physical inactivity No Other clinical condition associated with insulin resistance (eg, severe obesity, acanthosis nigricans) No Previous birth of an infant weighing ?4000 g No Previous stillbirth of unknown cause No Exam   Vitals:   01/09/21 1032  BP: 129/81  Pulse: 85  Weight: 73 kg   Fetal Heart Rate (bpm): 154  Uterus:     Pelvic Exam: Perineum: no hemorrhoids, normal perineum   Vulva: normal external genitalia, no lesions   Vagina:  normal mucosa, normal discharge   Cervix: no lesions and normal, pap smear done.    Adnexa: normal adnexa and no mass, fullness, tenderness   Bony Pelvis: average  System: General: well-developed, well-nourished female in no acute distress   Breast:  normal appearance, no masses or tenderness   Skin: normal coloration and turgor, no rashes   Neurologic: oriented, normal, negative, normal mood   Extremities: normal strength, tone, and muscle mass, ROM of all joints is normal   HEENT PERRLA, extraocular movement intact and sclera clear, anicteric   Mouth/Teeth mucous membranes moist, pharynx normal without lesions and dental hygiene good   Neck supple and no masses   Cardiovascular: regular rate and rhythm   Respiratory:  no respiratory distress, normal breath sounds   Abdomen: soft, non-tender; bowel sounds normal; no masses,  no organomegaly     Assessment:    Pregnancy: [id number] Patient Active Problem List   Diagnosis Date Noted  . Supervision of high risk pregnancy, antepartum 01/02/2021  . Preeclampsia complicating hypertension 07/20/2020  . Positive GBS test 07/20/2020  . Pelvic pain in pregnancy, antepartum, third trimester   . Anemia of pregnancy in third trimester   . Headache in pregnancy, antepartum, third trimester 07/13/2020  . Uterine irritability 07/10/2020  . Chronic hypertension affecting pregnancy 06/22/2020  . Decreased fetal movement 06/08/2020  . Gestational hypertension      Plan:  1. [redacted] weeks gestation of pregnancy - Cytology - PAP( [place]) - Cervicovaginal ancillary only( [place]) - Culture, OB Urine - CBC/D/Plt+RPR+Rh+ABO+Rub Ab... - [place] MFM OB COMP + 14 WK; Future - HgB A1c  2. Acne, unspecified acne type - clindamycin-benzoyl peroxide (BENZACLIN) gel; Apply topically 2 (two) times daily.  Dispense: 25 g; Refill: 0  3. Nausea and vomiting during pregnancy prior to [redacted] weeks gestation - metoCLOPramide (REGLAN) 10 MG tablet; Take 1 tablet (10 mg total) by mouth 3 (three) times daily with meals as needed for nausea.  Dispense: 90 tablet; Refill: 2 - promethazine (PHENERGAN) 12.5  MG tablet; Take 1-2 tablets (12.5-25 mg total) by mouth at bedtime as needed for nausea or vomiting.  Dispense: 30 tablet; Refill: 0  4. Supervision of other normal pregnancy, antepartum   Initial labs drawn. Continue prenatal vitamins. Discussed and offered genetic screening options, including Quad screen/AFP, NIPS testing, and option to decline testing. Benefits/risks/alternatives reviewed. Pt aware that anatomy US is form of genetic screening with lower accuracy in detecting trisomies than blood work.  Pt chooses/declines genetic screening today. First trimester screen and NIPS: requested. Ultrasound discussed; fetal anatomic survey: requested. Problem list reviewed and updated. The nature of [facility]  Faculty Practice with multiple MDs and other Advanced Practice Providers was explained to patient; also emphasized that residents, students are part of our team. Routine obstetric precautions reviewed. Return in about 4 weeks (around 02/06/2021).   Sande Rives, Student-MidWife 01/09/21 12:24 PM

## 2021-01-09 NOTE — Progress Notes (Signed)
NEW OB, c/o rashes all over her face, and needs PA for Diclegis.

## 2021-01-09 NOTE — Telephone Encounter (Signed)
Patient diclegis 10-10 has been approve until 09/30/2021 ref# [id number]

## 2021-01-09 NOTE — Patient Instructions (Signed)

## 2021-01-10 LAB — CBC/D/PLT+RPR+RH+ABO+RUB AB...
Antibody Screen: NEGATIVE
Basophils Absolute: 0 10*3/uL (ref 0.0–0.2)
Basos: 0 %
EOS (ABSOLUTE): 0.1 10*3/uL (ref 0.0–0.4)
Eos: 1 %
HCV Ab: <0.1 s/co ratio (ref 0.0–0.9)
HIV Screen 4th Generation wRfx: NONREACTIVE
Hematocrit: 37.3 % (ref 34.0–46.6)
Hemoglobin: 11.9 g/dL (ref 11.1–15.9)
Hepatitis B Surface Ag: NEGATIVE
Immature Grans (Abs): 0 10*3/uL (ref 0.0–0.1)
Immature Granulocytes: 0 %
Lymphocytes Absolute: 2.3 10*3/uL (ref 0.7–3.1)
Lymphs: 27 %
MCH: 23 pg — ABNORMAL LOW (ref 26.6–33.0)
MCHC: 31.9 g/dL (ref 31.5–35.7)
MCV: 72 fL — ABNORMAL LOW (ref 79–97)
Monocytes Absolute: 0.5 10*3/uL (ref 0.1–0.9)
Monocytes: 6 %
Neutrophils Absolute: 5.6 10*3/uL (ref 1.4–7.0)
Neutrophils: 66 %
Platelets: 253 10*3/uL (ref 150–450)
RBC: 5.17 x10E6/uL (ref 3.77–5.28)
RDW: 14.5 % (ref 11.7–15.4)
RPR Ser Ql: NONREACTIVE
Rh Factor: POSITIVE
Rubella Antibodies, IGG: 16.7 index (ref >0.99)
WBC: 8.4 10*3/uL (ref 3.4–10.8)

## 2021-01-10 LAB — HCV INTERPRETATION

## 2021-01-10 LAB — CERVICOVAGINAL ANCILLARY ONLY
Bacterial Vaginitis (gardnerella): NEGATIVE
Candida Glabrata: NEGATIVE
Candida Vaginitis: NEGATIVE
Chlamydia: NEGATIVE
Comment: NEGATIVE
Comment: NEGATIVE
Comment: NEGATIVE
Comment: NEGATIVE
Comment: NEGATIVE
Comment: NORMAL
Neisseria Gonorrhea: NEGATIVE
Trichomonas: NEGATIVE

## 2021-01-10 LAB — HEMOGLOBIN A1C
Est. average glucose Bld gHb Est-mCnc: 108 mg/dL
Hgb A1c MFr Bld: 5.4 % (ref 4.8–5.6)

## 2021-01-13 LAB — URINE CULTURE, OB REFLEX

## 2021-01-13 LAB — CULTURE, OB URINE

## 2021-01-15 ENCOUNTER — Encounter: Payer: Self-pay | Admitting: Advanced Practice Midwife

## 2021-01-15 DIAGNOSIS — O2340 Unspecified infection of urinary tract in pregnancy, unspecified trimester: Secondary | ICD-10-CM | POA: Insufficient documentation

## 2021-01-15 DIAGNOSIS — B951 Streptococcus, group B, as the cause of diseases classified elsewhere: Secondary | ICD-10-CM | POA: Insufficient documentation

## 2021-01-16 ENCOUNTER — Inpatient Hospital Stay (HOSPITAL_COMMUNITY)
Admission: AD | Admit: 2021-01-16 | Discharge: 2021-01-17 | Disposition: A | Discharge status: Home / Self Care | Payer: Managed Care, Other (non HMO) | Attending: Family Medicine | Admitting: Family Medicine

## 2021-01-16 ENCOUNTER — Other Ambulatory Visit: Payer: Self-pay

## 2021-01-16 ENCOUNTER — Encounter (HOSPITAL_COMMUNITY): Payer: Self-pay | Admitting: Family Medicine

## 2021-01-16 DIAGNOSIS — Z3A13 13 weeks gestation of pregnancy: Secondary | ICD-10-CM | POA: Diagnosis not present

## 2021-01-16 DIAGNOSIS — Z20822 Contact with and (suspected) exposure to covid-19: Secondary | ICD-10-CM | POA: Diagnosis not present

## 2021-01-16 DIAGNOSIS — O210 Mild hyperemesis gravidarum: Secondary | ICD-10-CM | POA: Diagnosis present

## 2021-01-16 DIAGNOSIS — B349 Viral infection, unspecified: Secondary | ICD-10-CM

## 2021-01-16 DIAGNOSIS — O98811 Other maternal infectious and parasitic diseases complicating pregnancy, first trimester: Secondary | ICD-10-CM | POA: Diagnosis not present

## 2021-01-16 DIAGNOSIS — O219 Vomiting of pregnancy, unspecified: Secondary | ICD-10-CM

## 2021-01-16 DIAGNOSIS — O0990 Supervision of high risk pregnancy, unspecified, unspecified trimester: Secondary | ICD-10-CM

## 2021-01-16 LAB — COMPREHENSIVE METABOLIC PANEL
ALT: 12 U/L (ref 0–44)
AST: 16 U/L (ref 15–41)
Albumin: 3.4 g/dL — ABNORMAL LOW (ref 3.5–5.0)
Alkaline Phosphatase: 75 U/L (ref 38–126)
Anion gap: 9 (ref 5–15)
BUN: <5 mg/dL — ABNORMAL LOW (ref 6–20)
CO2: 23 mmol/L (ref 22–32)
Calcium: 9.2 mg/dL (ref 8.9–10.3)
Chloride: 104 mmol/L (ref 98–111)
Creatinine, Ser: 0.74 mg/dL (ref 0.44–1.00)
GFR, Estimated: >60 mL/min (ref >60)
Glucose, Bld: 90 mg/dL (ref 70–99)
Potassium: 4 mmol/L (ref 3.5–5.1)
Sodium: 136 mmol/L (ref 135–145)
Total Bilirubin: 0.6 mg/dL (ref 0.3–1.2)
Total Protein: 7 g/dL (ref 6.5–8.1)

## 2021-01-16 LAB — URINALYSIS, ROUTINE W REFLEX MICROSCOPIC
Bilirubin Urine: NEGATIVE
Glucose, UA: NEGATIVE mg/dL
Hgb urine dipstick: NEGATIVE
Ketones, ur: NEGATIVE mg/dL
Leukocytes,Ua: NEGATIVE
Nitrite: NEGATIVE
Protein, ur: 100 mg/dL — AB
Specific Gravity, Urine: 1.025 (ref 1.005–1.030)
pH: 7 (ref 5.0–8.0)

## 2021-01-16 LAB — CBC
HCT: 39.7 % (ref 36.0–46.0)
Hemoglobin: 12.4 g/dL (ref 12.0–15.0)
MCH: 22.7 pg — ABNORMAL LOW (ref 26.0–34.0)
MCHC: 31.2 g/dL (ref 30.0–36.0)
MCV: 72.7 fL — ABNORMAL LOW (ref 80.0–100.0)
Platelets: 220 10*3/uL (ref 150–400)
RBC: 5.46 MIL/uL — ABNORMAL HIGH (ref 3.87–5.11)
RDW: 14 % (ref 11.5–15.5)
WBC: 7.2 10*3/uL (ref 4.0–10.5)
nRBC: 0 % (ref 0.0–0.2)

## 2021-01-16 LAB — CYTOLOGY - PAP
Comment: NEGATIVE
High risk HPV: NEGATIVE

## 2021-01-16 MED ORDER — LACTATED RINGERS IV BOLUS: 1000.0000 mL | Freq: Once | INTRAVENOUS | Status: DC

## 2021-01-16 MED ORDER — SODIUM CHLORIDE 0.9 % IV SOLN
25.0000 mg | Freq: Once | INTRAVENOUS | Status: AC
  Administered 2021-01-16: 25 mg via INTRAVENOUS
  Filled 2021-01-16: qty 1

## 2021-01-16 MED ORDER — ONDANSETRON 4 MG PO TBDP
8.0000 mg | ORAL_TABLET | Freq: Once | ORAL | Status: AC
  Administered 2021-01-16: 8 mg via ORAL
  Filled 2021-01-16: qty 2

## 2021-01-16 NOTE — MAU Note (Addendum)
PT SAYS HAS VOMITED X6 TODAY . HAS DICLEGIS , PHENERGAN, REGLAN.  . THEY HAVE BEEN WORKING- BUT DIDN'T TODAY.  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    [facility]- [facility].   Marland KitchenFELT BABY YESTERDAY -  NONE TODAY .

## 2021-01-16 NOTE — MAU Provider Note (Addendum)
History     CSN: [id number]  Arrival date and time: 01/16/21 1859   Event Date/Time   First Provider Initiated Contact with Patient 01/16/21 2126      Chief Complaint  Patient presents with  . Emesis   HPI This is a 24yo G3P2002 at [redacted]w[redacted]d. Has history of hyperemesis gravidum and has been on reglan, diclegis, and phenergan and has been pretty well controlled until this AM. Has vomited 6-7 times and threw up the medication. Denies fevers, chills. No chest pain, SOB. Is vaccinated, but only 2 shots.    OB History    Gravida  3   Para  2   Term  2   Preterm      AB      Living  2     SAB      IAB      Ectopic      Multiple  0   Live Births  2           Past Medical History:  Diagnosis Date  . Hypertension    Gestational Hypertension    Past Surgical History:  Procedure Laterality Date  . NO PAST SURGERIES      Family History  Problem Relation Age of Onset  . Lupus Mother   . Healthy Father   . Asthma Brother   . Diabetes Maternal Grandmother   . COPD Maternal Grandmother   . Diabetes Maternal Grandfather   . Stroke Maternal Grandfather     Social History   Tobacco Use  . Smoking status: Never Smoker  . Smokeless tobacco: Never Used  Vaping Use  . Vaping Use: Never used  Substance Use Topics  . Alcohol use: No  . Drug use: No    Allergies:  Allergies  Allergen Reactions  . Fentanyl Shortness Of Breath and Itching    Questionable SOB after IV Fentanyl    Medications Prior to Admission  Medication Sig Dispense Refill Last Dose  . clindamycin-benzoyl peroxide (BENZACLIN) gel Apply topically 2 (two) times daily. 25 g 0   . Doxylamine-Pyridoxine (DICLEGIS) 10-10 MG TBEC Take 1-2 tablets by mouth at bedtime. Start by taking two tablets at bedtime on day 1 and 2; if symptoms persist, take 1 tablet in morning and 2 tablets at bedtime on day 3; if symptoms persist, may increase to 1 tablet in morning, 1 tablet mid-afternoon, and 2 tablets at  bedtime on day 4 (maximum: doxylamine 40 mg/pyridoxine 40 mg (4 tablets) per day). (Patient not taking: Reported on 01/02/2021) 60 tablet 2   . famotidine (PEPCID) 10 MG tablet Take 1 tablet (10 mg total) by mouth 2 (two) times daily. (Patient not taking: Reported on 01/09/2021) 60 tablet 1   . metoCLOPramide (REGLAN) 10 MG tablet Take 1 tablet (10 mg total) by mouth 3 (three) times daily with meals as needed for nausea. 90 tablet 2   . ondansetron (ZOFRAN ODT) 8 MG disintegrating tablet Take 1 tablet (8 mg total) by mouth every 8 (eight) hours as needed for nausea or vomiting. 20 tablet 0   . Prenatal Vit-Fe Fumarate-FA (PRENATAL MULTIVITAMIN) TABS tablet Take 1 tablet by mouth daily at 12 noon.     . promethazine (PHENERGAN) 12.5 MG tablet Take 1-2 tablets (12.5-25 mg total) by mouth at bedtime as needed for nausea or vomiting. 30 tablet 0     Review of Systems Physical Exam   Blood pressure 136/81, pulse (!) 121, temperature 100 F (37.8 C), temperature source Oral,  resp. rate 20, height 5\' 2"  (1.575 m), weight 73 kg, last menstrual period 10/15/2020, not currently breastfeeding.  Physical Exam Vitals and nursing note reviewed.  Constitutional:      Appearance: Normal appearance.  HENT:     Head: Normocephalic and atraumatic.  Cardiovascular:     Rate and Rhythm: Normal rate.     Pulses: Normal pulses.     Heart sounds: Normal heart sounds.  Pulmonary:     Effort: Pulmonary effort is normal.     Breath sounds: Normal breath sounds.  Abdominal:     General: Abdomen is flat.     Palpations: Abdomen is soft.  Skin:    General: Skin is warm and dry.     Capillary Refill: Capillary refill takes less than 2 seconds.  Neurological:     General: No focal deficit present.     Mental Status: She is alert.  Psychiatric:        Mood and Affect: Mood normal.        Behavior: Behavior normal.        Thought Content: Thought content normal.        Judgment: Judgment normal.    Results for  orders placed or performed during the hospital encounter of 01/16/21 (from the past 24 hour(s))  Urinalysis, Routine w reflex microscopic     Status: Abnormal   Collection Time: 01/16/21  7:31 PM  Result Value Ref Range   Color, Urine YELLOW YELLOW   APPearance CLEAR CLEAR   Specific Gravity, Urine 1.025 1.005 - 1.030   pH 7.0 5.0 - 8.0   Glucose, UA NEGATIVE NEGATIVE mg/dL   Hgb urine dipstick NEGATIVE NEGATIVE   Bilirubin Urine NEGATIVE NEGATIVE   Ketones, ur NEGATIVE NEGATIVE mg/dL   Protein, ur 01/18/21 (A) NEGATIVE mg/dL   Nitrite NEGATIVE NEGATIVE   Leukocytes,Ua NEGATIVE NEGATIVE   RBC / HPF 0-5 0 - 5 RBC/hpf   WBC, UA 0-5 0 - 5 WBC/hpf   Bacteria, UA RARE (A) NONE SEEN   Squamous Epithelial / LPF 0-5 0 - 5   Mucus PRESENT   Comprehensive metabolic panel     Status: Abnormal   Collection Time: 01/16/21  7:44 PM  Result Value Ref Range   Sodium 136 135 - 145 mmol/L   Potassium 4.0 3.5 - 5.1 mmol/L   Chloride 104 98 - 111 mmol/L   CO2 23 22 - 32 mmol/L   Glucose, Bld 90 70 - 99 mg/dL   BUN <5 (L) 6 - 20 mg/dL   Creatinine, Ser 01/18/21 0.44 - 1.00 mg/dL   Calcium 9.2 8.9 - 2.45 mg/dL   Total Protein 7.0 6.5 - 8.1 g/dL   Albumin 3.4 (L) 3.5 - 5.0 g/dL   AST 16 15 - 41 U/L   ALT 12 0 - 44 U/L   Alkaline Phosphatase 75 38 - 126 U/L   Total Bilirubin 0.6 0.3 - 1.2 mg/dL   GFR, Estimated 80.9 >98 mL/min   Anion gap 9 5 - 15  CBC     Status: Abnormal   Collection Time: 01/16/21  7:44 PM  Result Value Ref Range   WBC 7.2 4.0 - 10.5 K/uL   RBC 5.46 (H) 3.87 - 5.11 MIL/uL   Hemoglobin 12.4 12.0 - 15.0 g/dL   HCT 01/18/21 82.5 - 05.3 %   MCV 72.7 (L) 80.0 - 100.0 fL   MCH 22.7 (L) 26.0 - 34.0 pg   MCHC 31.2 30.0 - 36.0 g/dL  RDW 14.0 11.5 - 15.5 %   Platelets 220 150 - 400 K/uL   nRBC 0.0 0.0 - 0.2 %    MAU Course  Procedures  MDM Will start LR bolus with phenergan 25mg  Check COVID  Patient signed out to , CNM. Edd Arbour, DO 2130   Assumed care at  2130  Pt vomited after phenergan and LR finished. Zofran ODT ordered - relieved nausea enough for pt to tolerate po food/drink (graham crackers and sprite)  Pt concerned nausea would return when she goes home, offered phenergan suppositories to help her get through this illness as it is likely viral. Pt amenable to plan.  Assessment and Plan  Nausea and vomiting in pregnancy Viral illness  Discharge home in stable condition with first trimester precautions Follow up at [facility] as scheduled - informed pt that if she is Covid+ we will switch any visits in the next 10 days to virtual See AVS for additional patient education  2131, CNM, MSN, Edd Arbour Certified Nurse Midwife, [facility]

## 2021-01-16 NOTE — MAU Provider Note (Incomplete Revision)
History     CSN: [id number]  Arrival date and time: 01/16/21 1859   Event Date/Time   First Provider Initiated Contact with Patient 01/16/21 2126      Chief Complaint  Patient presents with  . Emesis   HPI This is a 24yo G3P2002 at [redacted]w[redacted]d. Has history of hyperemesis gravidum and has been on reglan, diclegis, and phenergan and has been pretty well controlled until this AM. Has vomited 6-7 times and threw up the medication. Denies fevers, chills. No chest pain, SOB. Is vaccinated, but only 2 shots.    OB History    Gravida  3   Para  2   Term  2   Preterm      AB      Living  2     SAB      IAB      Ectopic      Multiple  0   Live Births  2           Past Medical History:  Diagnosis Date  . Hypertension    Gestational Hypertension    Past Surgical History:  Procedure Laterality Date  . NO PAST SURGERIES      Family History  Problem Relation Age of Onset  . Lupus Mother   . Healthy Father   . Asthma Brother   . Diabetes Maternal Grandmother   . COPD Maternal Grandmother   . Diabetes Maternal Grandfather   . Stroke Maternal Grandfather     Social History   Tobacco Use  . Smoking status: Never Smoker  . Smokeless tobacco: Never Used  Vaping Use  . Vaping Use: Never used  Substance Use Topics  . Alcohol use: No  . Drug use: No    Allergies:  Allergies  Allergen Reactions  . Fentanyl Shortness Of Breath and Itching    Questionable SOB after IV Fentanyl    Medications Prior to Admission  Medication Sig Dispense Refill Last Dose  . clindamycin-benzoyl peroxide (BENZACLIN) gel Apply topically 2 (two) times daily. 25 g 0   . Doxylamine-Pyridoxine (DICLEGIS) 10-10 MG TBEC Take 1-2 tablets by mouth at bedtime. Start by taking two tablets at bedtime on day 1 and 2; if symptoms persist, take 1 tablet in morning and 2 tablets at bedtime on day 3; if symptoms persist, may increase to 1 tablet in morning, 1 tablet mid-afternoon, and 2 tablets at  bedtime on day 4 (maximum: doxylamine 40 mg/pyridoxine 40 mg (4 tablets) per day). (Patient not taking: Reported on 01/02/2021) 60 tablet 2   . famotidine (PEPCID) 10 MG tablet Take 1 tablet (10 mg total) by mouth 2 (two) times daily. (Patient not taking: Reported on 01/09/2021) 60 tablet 1   . metoCLOPramide (REGLAN) 10 MG tablet Take 1 tablet (10 mg total) by mouth 3 (three) times daily with meals as needed for nausea. 90 tablet 2   . ondansetron (ZOFRAN ODT) 8 MG disintegrating tablet Take 1 tablet (8 mg total) by mouth every 8 (eight) hours as needed for nausea or vomiting. 20 tablet 0   . Prenatal Vit-Fe Fumarate-FA (PRENATAL MULTIVITAMIN) TABS tablet Take 1 tablet by mouth daily at 12 noon.     . promethazine (PHENERGAN) 12.5 MG tablet Take 1-2 tablets (12.5-25 mg total) by mouth at bedtime as needed for nausea or vomiting. 30 tablet 0     Review of Systems Physical Exam   Blood pressure 136/81, pulse (!) 121, temperature 100 F (37.8 C), temperature source Oral,  resp. rate 20, height 5\' 2"  (1.575 m), weight 73 kg, last menstrual period 10/15/2020, not currently breastfeeding.  Physical Exam Vitals and nursing note reviewed.  Constitutional:      Appearance: Normal appearance.  HENT:     Head: Normocephalic and atraumatic.  Cardiovascular:     Rate and Rhythm: Normal rate.     Pulses: Normal pulses.     Heart sounds: Normal heart sounds.  Pulmonary:     Effort: Pulmonary effort is normal.     Breath sounds: Normal breath sounds.  Abdominal:     General: Abdomen is flat.     Palpations: Abdomen is soft.  Skin:    General: Skin is warm and dry.     Capillary Refill: Capillary refill takes less than 2 seconds.  Neurological:     General: No focal deficit present.     Mental Status: She is alert.  Psychiatric:        Mood and Affect: Mood normal.        Behavior: Behavior normal.        Thought Content: Thought content normal.        Judgment: Judgment normal.    Results for  orders placed or performed during the hospital encounter of 01/16/21 (from the past 24 hour(s))  Urinalysis, Routine w reflex microscopic     Status: Abnormal   Collection Time: 01/16/21  7:31 PM  Result Value Ref Range   Color, Urine YELLOW YELLOW   APPearance CLEAR CLEAR   Specific Gravity, Urine 1.025 1.005 - 1.030   pH 7.0 5.0 - 8.0   Glucose, UA NEGATIVE NEGATIVE mg/dL   Hgb urine dipstick NEGATIVE NEGATIVE   Bilirubin Urine NEGATIVE NEGATIVE   Ketones, ur NEGATIVE NEGATIVE mg/dL   Protein, ur 01/18/21 (A) NEGATIVE mg/dL   Nitrite NEGATIVE NEGATIVE   Leukocytes,Ua NEGATIVE NEGATIVE   RBC / HPF 0-5 0 - 5 RBC/hpf   WBC, UA 0-5 0 - 5 WBC/hpf   Bacteria, UA RARE (A) NONE SEEN   Squamous Epithelial / LPF 0-5 0 - 5   Mucus PRESENT   Comprehensive metabolic panel     Status: Abnormal   Collection Time: 01/16/21  7:44 PM  Result Value Ref Range   Sodium 136 135 - 145 mmol/L   Potassium 4.0 3.5 - 5.1 mmol/L   Chloride 104 98 - 111 mmol/L   CO2 23 22 - 32 mmol/L   Glucose, Bld 90 70 - 99 mg/dL   BUN <5 (L) 6 - 20 mg/dL   Creatinine, Ser 01/18/21 0.44 - 1.00 mg/dL   Calcium 9.2 8.9 - 2.45 mg/dL   Total Protein 7.0 6.5 - 8.1 g/dL   Albumin 3.4 (L) 3.5 - 5.0 g/dL   AST 16 15 - 41 U/L   ALT 12 0 - 44 U/L   Alkaline Phosphatase 75 38 - 126 U/L   Total Bilirubin 0.6 0.3 - 1.2 mg/dL   GFR, Estimated 80.9 >98 mL/min   Anion gap 9 5 - 15  CBC     Status: Abnormal   Collection Time: 01/16/21  7:44 PM  Result Value Ref Range   WBC 7.2 4.0 - 10.5 K/uL   RBC 5.46 (H) 3.87 - 5.11 MIL/uL   Hemoglobin 12.4 12.0 - 15.0 g/dL   HCT 01/18/21 82.5 - 05.3 %   MCV 72.7 (L) 80.0 - 100.0 fL   MCH 22.7 (L) 26.0 - 34.0 pg   MCHC 31.2 30.0 - 36.0 g/dL  RDW 14.0 11.5 - 15.5 %   Platelets 220 150 - 400 K/uL   nRBC 0.0 0.0 - 0.2 %    MAU Course  Procedures  MDM Will start LR bolus with phenergan 25mg  Check COVID  Patient signed out to , CNM.  Assumed care at 2230  Pt vomited after  phenergan and LR finished. Zofran ODT ordered  Assessment and Plan

## 2021-01-17 ENCOUNTER — Encounter: Payer: Self-pay | Admitting: Advanced Practice Midwife

## 2021-01-17 DIAGNOSIS — O210 Mild hyperemesis gravidarum: Secondary | ICD-10-CM | POA: Diagnosis not present

## 2021-01-17 DIAGNOSIS — R87612 Low grade squamous intraepithelial lesion on cytologic smear of cervix (LGSIL): Secondary | ICD-10-CM | POA: Insufficient documentation

## 2021-01-17 LAB — SARS CORONAVIRUS 2 (TAT 6-24 HRS): SARS Coronavirus 2: NEGATIVE

## 2021-01-17 MED ORDER — PROMETHAZINE HCL 12.5 MG RE SUPP: 12.5000 mg | Freq: Four times a day (QID) | RECTAL | 0 refills | Status: DC | PRN

## 2021-01-17 NOTE — Discharge Instructions (Signed)
Viral Illness, Adult Viruses are tiny germs that can get into a person's body and cause illness. There are many different types of viruses, and they cause many types of illness. Viral illnesses can range from mild to severe. They can affect various parts of the body. Short-term conditions that are caused by a virus include colds and the flu (influenza). Long-term conditions that are caused by a virus include herpes, shingles, and HIV (human immunodeficiency virus) infection. A few viruses have been linked to certain cancers. What are the causes? Many types of viruses can cause illness. Viruses invade cells in your body, multiply, and cause the infected cells to work abnormally or die. When these cells die, they release more of the virus. When this happens, you develop symptoms of the illness, and the virus continues to spread to other cells. If the virus takes over the function of the cell, it can cause the cell to divide and grow out of control. This happens when a virus causes cancer. Different viruses get into the body in different ways. You can get a virus by:  Swallowing food or water that has come in contact with the virus (is contaminated).  Breathing in droplets that have been coughed or sneezed into the air by an infected person.  Touching a surface that has been contaminated with the virus and then touching your eyes, nose, or mouth.  Being bitten by an insect or animal that carries the virus.  Having sexual contact with a person who is infected with the virus.  Being exposed to blood or fluids that contain the virus, either through an open cut or during a transfusion. If a virus enters your body, your body's defense system (immune system) will try to fight the virus. You may be at higher risk for a viral illness if your immune system is weak. What are the signs or symptoms? You may have these symptoms, depending on the type of virus and the location of the cells that it  invades:  Cold and flu viruses: ? Fever. ? Headache. ? Sore throat. ? Muscle aches. ? Stuffy nose (nasal congestion). ? Cough.  Digestive system (gastrointestinal) viruses: ? Fever. ? Pain in the abdomen. ? Nausea. ? Diarrhea.  Liver viruses (hepatitis): ? Loss of appetite. ? Tiredness. ? Skin or the white parts of your eyes turning yellow (jaundice).  Brain and spinal cord viruses: ? Fever. ? Headache. ? Stiff neck. ? Nausea and vomiting. ? Confusion or sleepiness.  Skin viruses: ? Warts. ? Itching. ? Rash.  Sexually transmitted viruses: ? Discharge. ? Swelling. ? Redness. ? Rash. How is this diagnosed? This condition may be diagnosed based on one or more of the following:  Symptoms.  Medical history.  Physical exam.  Blood test, sample of mucus from your lungs (sputum sample), stool sample, or a swab of body fluids or a skin sore (lesion). How is this treated? Viruses can be hard to treat because they live within cells. Antibiotic medicines do not treat viruses because these medicines do not get inside cells. Treatment for a viral illness may include:  Resting and drinking plenty of fluids.  Medicines to relieve symptoms. These can include over-the-counter medicine for pain and fever, medicines for cough or congestion, and medicines to relieve diarrhea.  Antiviral medicines. These medicines are available only for certain types of viruses. Some viral illnesses can be prevented with vaccinations. A common example is the flu shot. Follow these instructions at home: Medicines  Take over-the-counter and   prescription medicines only as told by your health care provider.  If you were prescribed an antiviral medicine, take it as told by your health care provider. Do not stop taking the antiviral even if you start to feel better.  Be aware of when antibiotics are needed and when they are not needed. Antibiotics do not treat viruses. You may get an antibiotic if  your health care provider thinks that you may have, or are at risk for, a bacterial infection and you have a viral infection. ? Do not ask for an antibiotic prescription if you have been diagnosed with a viral illness. Antibiotics will not make your illness go away faster. ? Frequently taking antibiotics when they are not needed can lead to antibiotic resistance. When this develops, the medicine no longer works against the bacteria that it normally fights. General instructions  Drink enough fluids to keep your urine pale yellow.  Rest as much as possible.  Return to your normal activities as told by your health care provider. Ask your health care provider what activities are safe for you.  Keep all follow-up visits as told by your health care provider. This is important.   How is this prevented? To reduce your risk of viral illness:  Wash your hands often with soap and water for at least 20 seconds. If soap and water are not available, use hand sanitizer.  Avoid touching your nose, eyes, and mouth, especially if you have not washed your hands recently.  If anyone in your household has a viral infection, clean all household surfaces that may have been in contact with the virus. Use soap and hot water. You may also use bleach that you have added water to (diluted).  Stay away from people who are sick with symptoms of a viral infection.  Do not share items such as toothbrushes and water bottles with other people.  Keep your vaccinations up to date. This includes getting a yearly flu shot.  Eat a healthy diet and get plenty of rest.   Contact a health care provider if:  You have symptoms of a viral illness that do not go away.  Your symptoms come back after going away.  Your symptoms get worse. Get help right away if you have:  Trouble breathing.  A severe headache or a stiff neck.  Severe vomiting or pain in your abdomen. These symptoms may represent a serious problem that is  an emergency. Do not wait to see if the symptoms will go away. Get medical help right away. Call your local emergency services (911 in the [place] Do not drive yourself to the hospital. Summary  Viruses are types of germs that can get into a person's body and cause illness. Viral illnesses can range from mild to severe. They can affect various parts of the body.  Viruses can be hard to treat. There are medicines to relieve symptoms, and there are some antiviral medicines.  If you were prescribed an antiviral medicine, take it as told by your health care provider. Do not stop taking the antiviral even if you start to feel better.  Contact a health care provider if you have symptoms of a viral illness that do not go away. This information is not intended to replace advice given to you by your health care provider. Make sure you discuss any questions you have with your health care provider. Document Revised: 05/02/2020 Document Reviewed: 10/27/2019 Elsevier Patient Education  2021 Elsevier Inc.  

## 2021-01-30 ENCOUNTER — Telehealth: Payer: Self-pay

## 2021-01-30 NOTE — Telephone Encounter (Signed)
Phone call from patient regarding complaint of HA's since Saturday and cramping that started yesterday. Pt drinking 5-6 bottles of water, urine clear , states she is drinking water more than eating due to N&V phenergan helps. States pain is 8/10x and taking tylenol still no relief with HA Pt advised discomfort can be expected in pregnancy and will continue and baby grows.  Pt denies any bleeding and notes +FM  Advised of comfort measures pt still wants to be seen by provider  appt for next week moved to tomorrow at 1:40 pm Scheduling will confirm pt agreeable

## 2021-01-31 ENCOUNTER — Other Ambulatory Visit: Payer: Self-pay

## 2021-01-31 ENCOUNTER — Ambulatory Visit (INDEPENDENT_AMBULATORY_CARE_PROVIDER_SITE_OTHER): Payer: Managed Care, Other (non HMO) | Admitting: Advanced Practice Midwife

## 2021-01-31 VITALS — BP 132/86 | HR 105 | Wt 160.0 lb

## 2021-01-31 DIAGNOSIS — I10 Essential (primary) hypertension: Secondary | ICD-10-CM

## 2021-01-31 DIAGNOSIS — Z3480 Encounter for supervision of other normal pregnancy, unspecified trimester: Secondary | ICD-10-CM

## 2021-01-31 DIAGNOSIS — U071 COVID-19: Secondary | ICD-10-CM

## 2021-01-31 DIAGNOSIS — O219 Vomiting of pregnancy, unspecified: Secondary | ICD-10-CM

## 2021-01-31 DIAGNOSIS — Z3A15 15 weeks gestation of pregnancy: Secondary | ICD-10-CM

## 2021-01-31 LAB — POCT URINALYSIS DIPSTICK
Glucose, UA: NEGATIVE
Leukocytes, UA: NEGATIVE
Protein, UA: NEGATIVE
Spec Grav, UA: 1.015 (ref 1.010–1.025)
Urobilinogen, UA: 0.2 E.U./dL
pH, UA: 5 (ref 5.0–8.0)

## 2021-01-31 MED ORDER — ONDANSETRON 8 MG PO TBDP
8.0000 mg | ORAL_TABLET | Freq: Three times a day (TID) | ORAL | 4 refills | Status: DC | PRN
Start: 2021-01-31 — End: 2021-03-17

## 2021-01-31 MED ORDER — FAMOTIDINE 20 MG PO TABS: 20.0000 mg | ORAL_TABLET | Freq: Two times a day (BID) | ORAL | 3 refills | Status: DC

## 2021-01-31 MED ORDER — SCOPOLAMINE 1 MG/3DAYS TD PT72: 1.0000 | MEDICATED_PATCH | TRANSDERMAL | 12 refills | Status: DC

## 2021-01-31 NOTE — Patient Instructions (Signed)

## 2021-01-31 NOTE — Progress Notes (Signed)
PRENATAL VISIT NOTE  Subjective:  Michelle Thompson is a 25 y.o. G3P2002 at [redacted]w[redacted]d being seen today for ongoing prenatal care.  She is currently monitored for the following issues for this high-risk pregnancy and has Chronic hypertension affecting pregnancy; Supervision of high risk pregnancy, antepartum; History of gestational hypertension; GBS (group B streptococcus) UTI complicating pregnancy; and LGSIL on Pap smear of cervix on their problem list.  Patient reports nausea and vomiting.  This is a recurrent complaint. Patient has been evaluated in MAU and has an antiemetic regimen. She has not yet vomited today (as of 1430 hours) and is feeling encouraged about slow overall improvement. She did not have difficulties with vomiting in either of her previous pregnancies.   Contractions: Not present. Vag. Bleeding: None.  Movement: Absent. Denies leaking of fluid.   Patient works full time as a Lawyer. Her shifts are always at least 12 hours and she works two on, two off, three on, two off. She is feeling very depleted.   Patient endorses positive COVID test x 2 at work within the past week. She has finished her period of quarantine and denies symptoms.  The following portions of the patient's history were reviewed and updated as appropriate: allergies, current medications, past family history, past medical history, past social history, past surgical history and problem list. Problem list updated.  Objective:   Vitals:   01/31/21 1350  BP: 132/86  Pulse: (!) 105  Weight: 160 lb (72.6 kg)    Fetal Status: Fetal Heart Rate (bpm): 144   Movement: Absent     General:  Alert, oriented and cooperative. Patient is in no acute distress.  Skin: Skin is warm and dry. No rash noted.   Cardiovascular: Normal heart rate noted  Respiratory: Normal respiratory effort, no problems with respiration noted  Abdomen: Soft, gravid, appropriate for gestational age.  Pain/Pressure: Absent     Pelvic: Cervical exam  deferred        Extremities: Normal range of motion.  Edema: None  Mental Status: Normal mood and affect. Normal behavior. Normal judgment and thought content.   Assessment and Plan:  Pregnancy: G3P2002 at [redacted]w[redacted]d  1. Supervision of other normal pregnancy, antepartum - Routine care - Given work note to limit work hours to 36 hours per week per patient request - AFP, Serum, Open Spina Bifida  2. Chronic hypertension - Normotensive, not on medication - Baseline labs not collected at [facility] but CBC and CMET collected in MAU on 01/16/2021 - Protein / creatinine ratio, urine   3. Nausea and vomiting in pregnancy prior to [redacted] weeks gestation - Improving with Phenergan suppositories as previously prescribed - Discussed medications below, ok to add in or defer use based on potential out of pocket cost - ondansetron (ZOFRAN ODT) 8 MG disintegrating tablet; Take 1 tablet (8 mg total) by mouth every 8 (eight) hours as needed for nausea or vomiting.  Dispense: 120 tablet; Refill: 4 - scopolamine (TRANSDERM-SCOP) 1 MG/3DAYS; Place 1 patch (1.5 mg total) onto the skin every 3 (three) days.  Dispense: 10 patch; Refill: 12 - famotidine (PEPCID) 20 MG tablet; Take 1 tablet (20 mg total) by mouth 2 (two) times daily.  Dispense: 60 tablet; Refill: 3 - POCT Urinalysis Dipstick  4. [redacted] weeks gestation of pregnancy  5. COVID-19 virus infection - Negative on 01/16/21 but two positive tests at work, per patient  Preterm labor symptoms and general obstetric precautions including but not limited to vaginal bleeding, contractions, leaking of fluid  and fetal movement were reviewed in detail with the patient. Please refer to After Visit Summary for other counseling recommendations.  Return in about 4 weeks (around 02/28/2021).  Future Appointments  Date Time Provider Department Center  02/27/2021 10:45 AM [facility]  02/28/2021  9:35 AM [facility]Kir[facility], Wilmer Floor, CNM [facility] None    Calvert Cantor,  CNM

## 2021-02-01 LAB — PROTEIN / CREATININE RATIO, URINE
Creatinine, Urine: 183.5 mg/dL
Protein, Ur: 42.5 mg/dL
Protein/Creat Ratio: 232 mg/g creat — ABNORMAL HIGH (ref 0–200)

## 2021-02-02 LAB — AFP, SERUM, OPEN SPINA BIFIDA
AFP MoM: 1.21
AFP Value: 37.9 ng/mL
Gest. Age on Collection Date: 15 weeks
Maternal Age At EDD: 23.1 yr
OSBR Risk 1 IN: 10000
Test Results:: NEGATIVE
Weight: 160 [lb_av]

## 2021-02-06 ENCOUNTER — Encounter: Payer: Managed Care, Other (non HMO) | Admitting: Advanced Practice Midwife

## 2021-02-27 ENCOUNTER — Ambulatory Visit: Payer: Managed Care, Other (non HMO)

## 2021-02-28 ENCOUNTER — Encounter: Payer: Managed Care, Other (non HMO) | Admitting: Advanced Practice Midwife

## 2021-03-06 ENCOUNTER — Other Ambulatory Visit: Payer: Self-pay | Admitting: Obstetrics

## 2021-03-06 ENCOUNTER — Ambulatory Visit: Payer: Managed Care, Other (non HMO) | Attending: Advanced Practice Midwife

## 2021-03-06 ENCOUNTER — Other Ambulatory Visit: Payer: Self-pay

## 2021-03-06 DIAGNOSIS — O10012 Pre-existing essential hypertension complicating pregnancy, second trimester: Secondary | ICD-10-CM | POA: Diagnosis not present

## 2021-03-06 DIAGNOSIS — Z3A12 12 weeks gestation of pregnancy: Secondary | ICD-10-CM | POA: Diagnosis not present

## 2021-03-06 DIAGNOSIS — Z363 Encounter for antenatal screening for malformations: Secondary | ICD-10-CM | POA: Insufficient documentation

## 2021-03-06 DIAGNOSIS — Z362 Encounter for other antenatal screening follow-up: Secondary | ICD-10-CM

## 2021-03-06 DIAGNOSIS — Z3A20 20 weeks gestation of pregnancy: Secondary | ICD-10-CM | POA: Insufficient documentation

## 2021-03-06 DIAGNOSIS — O2690 Pregnancy related conditions, unspecified, unspecified trimester: Secondary | ICD-10-CM

## 2021-03-08 ENCOUNTER — Other Ambulatory Visit: Payer: Self-pay

## 2021-03-08 ENCOUNTER — Encounter: Payer: Self-pay | Admitting: Obstetrics

## 2021-03-08 ENCOUNTER — Ambulatory Visit (INDEPENDENT_AMBULATORY_CARE_PROVIDER_SITE_OTHER): Payer: Managed Care, Other (non HMO) | Admitting: Obstetrics

## 2021-03-08 VITALS — BP 137/90 | HR 112 | Wt 163.0 lb

## 2021-03-08 DIAGNOSIS — I10 Essential (primary) hypertension: Secondary | ICD-10-CM

## 2021-03-08 DIAGNOSIS — Z3480 Encounter for supervision of other normal pregnancy, unspecified trimester: Secondary | ICD-10-CM

## 2021-03-08 NOTE — Progress Notes (Signed)
Subjective:  Michelle Thompson is a 25 y.o. G3P2002 at [redacted]w[redacted]d being seen today for ongoing prenatal care.  She is currently monitored for the following issues for this high-risk pregnancy and has Chronic hypertension affecting pregnancy; Supervision of high risk pregnancy, antepartum; History of gestational hypertension; GBS (group B streptococcus) UTI complicating pregnancy; and LGSIL on Pap smear of cervix on their problem list.  Patient reports increased fatigue and stress from work.  Has to work 12 hour shifts from 7pm - 7 am.  Contractions: Not present. Vag. Bleeding: None.  Movement: Present. Denies leaking of fluid.   The following portions of the patient's history were reviewed and updated as appropriate: allergies, current medications, past family history, past medical history, past social history, past surgical history and problem list. Problem list updated.  Objective:   Vitals:   03/08/21 1113  BP: 137/90  Pulse: (!) 112  Weight: 163 lb (73.9 kg)    Fetal Status:     Movement: Present     General:  Alert, oriented and cooperative. Patient is in no acute distress.  Skin: Skin is warm and dry. No rash noted.   Cardiovascular: Normal heart rate noted  Respiratory: Normal respiratory effort, no problems with respiration noted  Abdomen: Soft, gravid, appropriate for gestational age. Pain/Pressure: Absent     Pelvic:  Cervical exam deferred        Extremities: Normal range of motion.  Edema: None  Mental Status: Normal mood and affect. Normal behavior. Normal judgment and thought content.   Urinalysis:      Assessment and Plan:  Pregnancy: G3P2002 at [redacted]w[redacted]d  1. Supervision of high risk pregnancy, antepartum  2. Chronic hypertension - clinically stable  Preterm labor symptoms and general obstetric precautions including but not limited to vaginal bleeding, contractions, leaking of fluid and fetal movement were reviewed in detail with the patient. Please refer to After Visit  Summary for other counseling recommendations.   Return in about 4 weeks (around 04/05/2021) for ROB.   Brock Bad, MD  03/08/21

## 2021-03-08 NOTE — Progress Notes (Signed)
ROB 20w  Pt not taking baby Asprin   Pt wants to discuss work hours.  Pt notes working long hours causes discomfort.  Pt advised to discuss with provider and possibly contact HR dept at work for accomodation paperwork.

## 2021-03-17 ENCOUNTER — Inpatient Hospital Stay (HOSPITAL_COMMUNITY)
Admission: AD | Admit: 2021-03-17 | Discharge: 2021-03-17 | Disposition: A | Discharge status: Home / Self Care | Payer: Managed Care, Other (non HMO) | Attending: Family Medicine | Admitting: Family Medicine

## 2021-03-17 ENCOUNTER — Other Ambulatory Visit: Payer: Self-pay

## 2021-03-17 ENCOUNTER — Encounter (HOSPITAL_COMMUNITY): Payer: Self-pay | Admitting: Family Medicine

## 2021-03-17 DIAGNOSIS — R109 Unspecified abdominal pain: Secondary | ICD-10-CM | POA: Diagnosis not present

## 2021-03-17 DIAGNOSIS — O219 Vomiting of pregnancy, unspecified: Secondary | ICD-10-CM | POA: Diagnosis not present

## 2021-03-17 DIAGNOSIS — O10912 Unspecified pre-existing hypertension complicating pregnancy, second trimester: Secondary | ICD-10-CM | POA: Insufficient documentation

## 2021-03-17 DIAGNOSIS — O212 Late vomiting of pregnancy: Secondary | ICD-10-CM | POA: Insufficient documentation

## 2021-03-17 DIAGNOSIS — O26892 Other specified pregnancy related conditions, second trimester: Secondary | ICD-10-CM

## 2021-03-17 DIAGNOSIS — M545 Low back pain, unspecified: Secondary | ICD-10-CM | POA: Diagnosis not present

## 2021-03-17 DIAGNOSIS — O10919 Unspecified pre-existing hypertension complicating pregnancy, unspecified trimester: Secondary | ICD-10-CM

## 2021-03-17 DIAGNOSIS — Z3A21 21 weeks gestation of pregnancy: Secondary | ICD-10-CM | POA: Diagnosis not present

## 2021-03-17 LAB — URINALYSIS, ROUTINE W REFLEX MICROSCOPIC
Bacteria, UA: NONE SEEN
Bilirubin Urine: NEGATIVE
Glucose, UA: NEGATIVE mg/dL
Hgb urine dipstick: NEGATIVE
Ketones, ur: NEGATIVE mg/dL
Leukocytes,Ua: NEGATIVE
Nitrite: NEGATIVE
Protein, ur: 30 mg/dL — AB
Specific Gravity, Urine: 1.023 (ref 1.005–1.030)
pH: 6 (ref 5.0–8.0)

## 2021-03-17 LAB — COMPREHENSIVE METABOLIC PANEL
ALT: 10 U/L (ref 0–44)
AST: 15 U/L (ref 15–41)
Albumin: 3 g/dL — ABNORMAL LOW (ref 3.5–5.0)
Alkaline Phosphatase: 77 U/L (ref 38–126)
Anion gap: 6 (ref 5–15)
BUN: 8 mg/dL (ref 6–20)
CO2: 21 mmol/L — ABNORMAL LOW (ref 22–32)
Calcium: 8.6 mg/dL — ABNORMAL LOW (ref 8.9–10.3)
Chloride: 104 mmol/L (ref 98–111)
Creatinine, Ser: 0.69 mg/dL (ref 0.44–1.00)
GFR, Estimated: >60 mL/min (ref >60)
Glucose, Bld: 101 mg/dL — ABNORMAL HIGH (ref 70–99)
Potassium: 3.6 mmol/L (ref 3.5–5.1)
Sodium: 131 mmol/L — ABNORMAL LOW (ref 135–145)
Total Bilirubin: 0.7 mg/dL (ref 0.3–1.2)
Total Protein: 5.9 g/dL — ABNORMAL LOW (ref 6.5–8.1)

## 2021-03-17 LAB — WET PREP, GENITAL
Clue Cells Wet Prep HPF POC: NONE SEEN
Sperm: NONE SEEN
Trich, Wet Prep: NONE SEEN
Yeast Wet Prep HPF POC: NONE SEEN

## 2021-03-17 LAB — PROTEIN / CREATININE RATIO, URINE
Creatinine, Urine: 138.18 mg/dL
Protein Creatinine Ratio: 0.17 mg/mg{Cre} — ABNORMAL HIGH (ref 0.00–0.15)
Total Protein, Urine: 23 mg/dL

## 2021-03-17 LAB — CBC
HCT: 33.2 % — ABNORMAL LOW (ref 36.0–46.0)
Hemoglobin: 10.9 g/dL — ABNORMAL LOW (ref 12.0–15.0)
MCH: 24 pg — ABNORMAL LOW (ref 26.0–34.0)
MCHC: 32.8 g/dL (ref 30.0–36.0)
MCV: 73.1 fL — ABNORMAL LOW (ref 80.0–100.0)
Platelets: 164 10*3/uL (ref 150–400)
RBC: 4.54 MIL/uL (ref 3.87–5.11)
RDW: 14.2 % (ref 11.5–15.5)
WBC: 10.9 10*3/uL — ABNORMAL HIGH (ref 4.0–10.5)
nRBC: 0 % (ref 0.0–0.2)

## 2021-03-17 MED ORDER — DEXAMETHASONE SODIUM PHOSPHATE 10 MG/ML IJ SOLN
10.0000 mg | Freq: Once | INTRAMUSCULAR | Status: AC
  Administered 2021-03-17: 10 mg via INTRAVENOUS
  Filled 2021-03-17: qty 1

## 2021-03-17 MED ORDER — FAMOTIDINE IN NACL 20-0.9 MG/50ML-% IV SOLN
20.0000 mg | Freq: Once | INTRAVENOUS | Status: AC
  Administered 2021-03-17: 20 mg via INTRAVENOUS
  Filled 2021-03-17: qty 50

## 2021-03-17 MED ORDER — SODIUM CHLORIDE 0.9 % IV SOLN
25.0000 mg | Freq: Once | INTRAVENOUS | Status: DC
  Filled 2021-03-17: qty 1

## 2021-03-17 MED ORDER — DIPHENHYDRAMINE HCL 50 MG/ML IJ SOLN
12.5000 mg | Freq: Once | INTRAMUSCULAR | Status: AC
  Administered 2021-03-17: 12.5 mg via INTRAVENOUS
  Filled 2021-03-17: qty 1

## 2021-03-17 MED ORDER — LACTATED RINGERS IV BOLUS
1000.0000 mL | Freq: Once | INTRAVENOUS | Status: AC
  Administered 2021-03-17: 1000 mL via INTRAVENOUS

## 2021-03-17 MED ORDER — ONDANSETRON HCL 4 MG/2ML IJ SOLN
4.0000 mg | Freq: Once | INTRAMUSCULAR | Status: AC
  Administered 2021-03-17: 4 mg via INTRAVENOUS
  Filled 2021-03-17: qty 2

## 2021-03-17 MED ORDER — NIFEDIPINE ER OSMOTIC RELEASE 30 MG PO TB24: 30.0000 mg | ORAL_TABLET | Freq: Every day | ORAL | 3 refills | Status: DC

## 2021-03-17 NOTE — MAU Provider Note (Signed)
Chief Complaint:  Nausea, Emesis, Headache, and Cramping   Event Date/Time   First Provider Initiated Contact with Patient 03/17/21 1911     HPI: Michelle Thompson is a 25 y.o. G3P2002 at [redacted]w[redacted]d who presents to maternity admissions reporting nausea/vomiting x2 days with severe headache unresponsive to Tylenol. Also having low back pain and abdominal cramping. Low back pain has been going on for about a week, cramping started within 12hrs of last IC (two days ago). Had increased BP on her home cuff (140s/90s). Denies vaginal bleeding, leaking of fluid, decreased fetal movement, fever, falls, or recent illness.   Pregnancy Course: Receives care at [facility], hx gestational hypertension and hyperemesis gravidarum  Past Medical History:  Diagnosis Date  . Hypertension    Gestational Hypertension   OB History  Gravida Para Term Preterm AB Living  3 2 2     2   SAB IAB Ectopic Multiple Live Births        0 2    # Outcome Date GA Lbr Len/2nd Weight Sex Delivery Anes PTL Lv  3 Current           2 Term 07/20/20 [redacted]w[redacted]d 11:50 / 00:07 6 lb 6.5 oz (2.906 kg) M Vag-Spont EPI  LIV     Birth Comments:  wnl  1 Term 01/14/16    M Vag-Spont   LIV   Past Surgical History:  Procedure Laterality Date  . NO PAST SURGERIES     Family History  Problem Relation Age of Onset  . Lupus Mother   . Healthy Father   . Asthma Brother   . Diabetes Maternal Grandmother   . COPD Maternal Grandmother   . Cancer Maternal Grandmother   . Diabetes Maternal Grandfather   . Stroke Maternal Grandfather    Social History   Tobacco Use  . Smoking status: Never Smoker  . Smokeless tobacco: Never Used  Vaping Use  . Vaping Use: Never used  Substance Use Topics  . Alcohol use: No  . Drug use: No   Allergies  Allergen Reactions  . Fentanyl Shortness Of Breath and Itching    Questionable SOB after IV Fentanyl   No medications prior to admission.   I have reviewed patient's Past Medical Hx, Surgical Hx, Family  Hx, Social Hx, medications and allergies.   ROS:  Review of Systems  Constitutional: Negative for fatigue and fever.  HENT: Negative for congestion and sore throat.   Eyes: Positive for visual disturbance (has had episodes of blurry vision).  Respiratory: Negative for cough and shortness of breath.   Cardiovascular: Negative for chest pain.  Gastrointestinal: Positive for abdominal pain, nausea and vomiting.  Genitourinary: Negative for flank pain, vaginal bleeding and vaginal discharge.  Musculoskeletal: Positive for back pain.  Neurological: Negative for dizziness, syncope and headaches.  All other systems reviewed and are negative.  Physical Exam   Patient Vitals for the past 24 hrs:  BP Temp Temp src Pulse Resp SpO2 Height Weight  03/17/21 2046 (!) 113/57 - - (!) 109 15 - - -  03/17/21 2031 113/61 - - (!) 101 - - - -  03/17/21 2017 131/74 - - 97 - - - -  03/17/21 2001 125/77 - - 93 - - - -  03/17/21 1946 115/61 - - 98 - - - -  03/17/21 1931 114/68 - - 90 - - - -  03/17/21 1918 127/75 - - 95 - - - -  03/17/21 1841 - - - - - 100 % - -  03/17/21 1840 (!) 144/84 - - (!) 102 18 - - -  03/17/21 1823 (!) 141/92 98.2 F (36.8 C) Oral (!) 109 20 99 % - -  03/17/21 1816 - - - - - - 5\' 2"  (1.575 m) 165 lb 4.8 oz (75 kg)   Constitutional: Well-developed, well-nourished female in no acute distress.  Cardiovascular: normal rate & rhythm, no murmur Respiratory: normal effort, lung sounds clear throughout GI: Abd soft, non-tender, gravid appropriate for gestational age. Pos BS x 4 MS: Extremities nontender, no edema, normal ROM Neurologic: Alert and oriented x 4.  GU: no CVA tenderness Pelvic exam deferred, cramping eased with LR bolus  FHR: 142   Labs: Results for orders placed or performed during the hospital encounter of 03/17/21 (from the past 24 hour(s))  Urinalysis, Routine w reflex microscopic Urine, Clean Catch     Status: Abnormal   Collection Time: 03/17/21  6:35 PM   Result Value Ref Range   Color, Urine YELLOW YELLOW   APPearance CLEAR CLEAR   Specific Gravity, Urine 1.023 1.005 - 1.030   pH 6.0 5.0 - 8.0   Glucose, UA NEGATIVE NEGATIVE mg/dL   Hgb urine dipstick NEGATIVE NEGATIVE   Bilirubin Urine NEGATIVE NEGATIVE   Ketones, ur NEGATIVE NEGATIVE mg/dL   Protein, ur 30 (A) NEGATIVE mg/dL   Nitrite NEGATIVE NEGATIVE   Leukocytes,Ua NEGATIVE NEGATIVE   RBC / HPF 0-5 0 - 5 RBC/hpf   WBC, UA 0-5 0 - 5 WBC/hpf   Bacteria, UA NONE SEEN NONE SEEN   Squamous Epithelial / LPF 0-5 0 - 5   Mucus PRESENT   Protein / creatinine ratio, urine     Status: Abnormal   Collection Time: 03/17/21  6:35 PM  Result Value Ref Range   Creatinine, Urine 138.18 mg/dL   Total Protein, Urine 23 mg/dL   Protein Creatinine Ratio 0.17 (H) 0.00 - 0.15 mg/mg[Cre]  CBC     Status: Abnormal   Collection Time: 03/17/21  7:21 PM  Result Value Ref Range   WBC 10.9 (H) 4.0 - 10.5 K/uL   RBC 4.54 3.87 - 5.11 MIL/uL   Hemoglobin 10.9 (L) 12.0 - 15.0 g/dL   HCT 03/19/21 (L) 50.3 - 88.8 %   MCV 73.1 (L) 80.0 - 100.0 fL   MCH 24.0 (L) 26.0 - 34.0 pg   MCHC 32.8 30.0 - 36.0 g/dL   RDW 28.0 03.4 - 91.7 %   Platelets 164 150 - 400 K/uL   nRBC 0.0 0.0 - 0.2 %  Comprehensive metabolic panel     Status: Abnormal   Collection Time: 03/17/21  7:21 PM  Result Value Ref Range   Sodium 131 (L) 135 - 145 mmol/L   Potassium 3.6 3.5 - 5.1 mmol/L   Chloride 104 98 - 111 mmol/L   CO2 21 (L) 22 - 32 mmol/L   Glucose, Bld 101 (H) 70 - 99 mg/dL   BUN 8 6 - 20 mg/dL   Creatinine, Ser 03/19/21 0.44 - 1.00 mg/dL   Calcium 8.6 (L) 8.9 - 10.3 mg/dL   Total Protein 5.9 (L) 6.5 - 8.1 g/dL   Albumin 3.0 (L) 3.5 - 5.0 g/dL   AST 15 15 - 41 U/L   ALT 10 0 - 44 U/L   Alkaline Phosphatase 77 38 - 126 U/L   Total Bilirubin 0.7 0.3 - 1.2 mg/dL   GFR, Estimated 0.56 >97 mL/min   Anion gap 6 5 - 15  Wet prep, genital  Status: Abnormal   Collection Time: 03/17/21  7:21 PM   Specimen: Vaginal  Result  Value Ref Range   Yeast Wet Prep HPF POC NONE SEEN NONE SEEN   Trich, Wet Prep NONE SEEN NONE SEEN   Clue Cells Wet Prep HPF POC NONE SEEN NONE SEEN   WBC, Wet Prep HPF POC MODERATE (A) NONE SEEN   Sperm NONE SEEN    Imaging:  No results found.  MAU Course: Orders Placed This Encounter  Procedures  . Wet prep, genital  . Urinalysis, Routine w reflex microscopic Urine, Clean Catch  . CBC  . Comprehensive metabolic panel  . Protein / creatinine ratio, urine  . Discharge patient   Meds ordered this encounter  Medications  . lactated ringers bolus 1,000 mL  . famotidine (PEPCID) IVPB 20 mg premix  . promethazine (PHENERGAN) 25 mg in sodium chloride 0.9 % 50 mL IVPB  . dexamethasone (DECADRON) injection 10 mg  . diphenhydrAMINE (BENADRYL) injection 12.5 mg  . ondansetron (ZOFRAN) injection 4 mg  . NIFEdipine (PROCARDIA XL) 30 MG 24 hr tablet    Sig: Take 1 tablet (30 mg total) by mouth daily.    Dispense:  30 tablet    Refill:  3    Order Specific Question:   Supervising Provider    Answer:   Samara Snide   MDM: Baseline hypertension labs normal, proteinuria likely due to vomiting/poor po intake Wet prep negative Cramping calmed with bolus and easing of nausea Pt tolerated po intake of fluids and crackers  Started on daily procardia XL for chronic hypertension  Assessment: 1. Chronic hypertension affecting pregnancy   2. Nausea/vomiting in pregnancy   3. Abdominal pain during pregnancy in second trimester   4. Low back pain during pregnancy in second trimester    Plan: Discharge home in stable condition with second trimester precautions.     Follow-up Information    [facility]. Go to.   Specialty: Obstetrics and Gynecology Why: as scheduled for ongoing prenatal care Contact information: [address] [place] [address] [phone]              Allergies as of 03/17/2021      Reactions    Fentanyl Shortness Of Breath, Itching   Questionable SOB after IV Fentanyl      Medication List    STOP taking these medications   clindamycin-benzoyl peroxide gel Commonly known as: BenzaClin   Doxylamine-Pyridoxine 10-10 MG Tbec Commonly known as: Diclegis   metoCLOPramide 10 MG tablet Commonly known as: Reglan   ondansetron 8 MG disintegrating tablet Commonly known as: Zofran ODT   scopolamine 1 MG/3DAYS Commonly known as: TRANSDERM-SCOP     TAKE these medications   famotidine 20 MG tablet Commonly known as: PEPCID Take 1 tablet (20 mg total) by mouth 2 (two) times daily.   NIFEdipine 30 MG 24 hr tablet Commonly known as: Procardia XL Take 1 tablet (30 mg total) by mouth daily.   prenatal multivitamin Tabs tablet Take 1 tablet by mouth daily at 12 noon.   promethazine 12.5 MG tablet Commonly known as: PHENERGAN Take 1-2 tablets (12.5-25 mg total) by mouth at bedtime as needed for nausea or vomiting. What changed: Another medication with the same name was removed. Continue taking this medication, and follow the directions you see here.       Edd Arbour, CNM, MSN, IBCLC Certified Nurse Midwife, [facility]

## 2021-03-17 NOTE — Discharge Instructions (Signed)
Hypertension During Pregnancy Hypertension is also called high blood pressure. High blood pressure means that the force of the blood moving in your body is high enough to cause problems for you and your baby. Different types of high blood pressure can happen during pregnancy. The types are:  High blood pressure before you got pregnant. This is called chronic hypertension.  This can continue during your pregnancy. Your doctor will want to keep checking your blood pressure. You may need medicine to control your blood pressure while you are pregnant. You will need follow-up visits after you have your baby.  High blood pressure that goes up during pregnancy when it was normal before. This is called gestational hypertension. It will often get better after you have your baby, but your doctor will need to watch your blood pressure to make sure that it is getting better.  You may develop high blood pressure after giving birth. This is called postpartum hypertension. This often occurs within 48 hours after childbirth but may occur up to 6 weeks after giving birth. Very high blood pressure during pregnancy is an emergency that needs treatment right away. How does this affect me? If you have high blood pressure during pregnancy, you have a higher chance of developing high blood pressure:  As you get older.  If you get pregnant again. In some cases, high blood pressure during pregnancy can cause:  Stroke.  Heart attack.  Damage to the kidneys, lungs, or liver.  Preeclampsia.  HELLP syndrome.  Seizures.  Problems with the placenta. How does this affect my baby? Your baby may:  Be born early.  Not weigh as much as he or she should.  Not handle labor well, leading to a C-section. This condition may also result in a baby's death before birth (stillbirth). What are the risks?  Having high blood pressure during a past pregnancy.  Being overweight.  Being age 35 or older.  Being pregnant  for the first time.  Being pregnant with more than one baby.  Becoming pregnant using fertility methods, such as IVF.  Having other problems, such as diabetes or kidney disease. What can I do to lower my risk?  Keep a healthy weight.  Eat a healthy diet.  Follow what your doctor tells you about treating any medical problems that you had before you got pregnant. It is very important to go to all of your doctor visits. Your doctor will check your blood pressure and make sure that your pregnancy is progressing as it should. Treatment should start early if a problem is found.   How is this treated? Treatment for high blood pressure during pregnancy can vary. It depends on the type of high blood pressure you have and how serious it is.  If you were taking medicine for your blood pressure before you got pregnant, talk with your doctor. You may need to change the medicine during pregnancy if it is not safe for your baby.  If your blood pressure goes up during pregnancy, your doctor may order medicine to treat this.  If you are at risk for preeclampsia, your doctor may tell you to take a low-dose aspirin while you are pregnant.  If you have very high blood pressure, you may need to stay in the hospital so you and your baby can be watched closely. You may also need to take medicine to lower your blood pressure.  In some cases, if your condition gets worse, you may need to have your baby early.   Follow these instructions at home: Eating and drinking  Drink enough fluid to keep your pee (urine) pale yellow.  Avoid caffeine.   Lifestyle  Do not smoke or use any products that contain nicotine or tobacco. If you need help quitting, ask your doctor.  Do not use alcohol or drugs.  Avoid stress.  Rest and get plenty of sleep.  Regular exercise can help. Ask your doctor what kinds of exercise are best for you. General instructions  Take over-the-counter and prescription medicines only as  told by your doctor.  Keep all prenatal and follow-up visits. Contact a doctor if:  You have symptoms that your doctor told you to watch for, such as: ? Headaches. ? A feeling like you may vomit (nausea). ? Vomiting. ? Belly (abdominal) pain. ? Feeling dizzy or light-headed. Get help right away if:  You have symptoms of serious problems, such as: ? Very bad belly pain that does not get better with treatment. ? A very bad headache that does not get better. ? Blurry vision. ? Double vision. ? Vomiting that does not get better. ? Sudden, fast weight gain. ? Sudden swelling in your hands, ankles, or face. ? Bleeding from your vagina. ? Blood in your pee. ? Shortness of breath. ? Chest pain. ? Weakness on one side of your body. ? Trouble talking.  Your baby is not moving as much as usual. These symptoms may be an emergency. Get help right away. Call your local emergency services (911 in the [place]  Do not wait to see if the symptoms will go away.  Do not drive yourself to the hospital. Summary  High blood pressure is also called hypertension.  High blood pressure means that the force of the blood moving in your body is high enough to cause problems for you and your baby.  Get help right away if you have symptoms of serious problems due to high blood pressure.  Keep all prenatal and follow-up visits. This information is not intended to replace advice given to you by your health care provider. Make sure you discuss any questions you have with your health care provider. Document Revised: 09/08/2020 Document Reviewed: 09/08/2020 Elsevier Patient Education  2021 [employer]  

## 2021-03-17 NOTE — MAU Note (Signed)
Presents with c/o increased BP, reports BP was taken @ work 2 hours ago, BP 150/110.  Reports currently has blurred vision & H/A, denies epigastric pain.  Tylenol taken for H/A @ 1500, no relief noted.  Also reports N/V, reports unable to keep anything down.  Reports took Phenergan, no relief.  States Phenergan usually relieves N/V. Denies VB or LOF.  Endorses +FM.

## 2021-03-20 LAB — GC/CHLAMYDIA PROBE AMP (~~LOC~~) NOT AT ARMC
Chlamydia: NEGATIVE
Comment: NEGATIVE
Comment: NORMAL
Neisseria Gonorrhea: NEGATIVE

## 2021-04-04 ENCOUNTER — Telehealth: Payer: Self-pay

## 2021-04-04 NOTE — Telephone Encounter (Signed)
Received call from patient- she reports having some abdominal pain on/off  for several hours today. She is requesting to be seen however we do not have any more appointments this afternoon. I have advised patient to try tylenol for some relief and to stay hydrated. Patient can try a heating pad as well. If pain gets worse please follow up with Women's and Children unit.Patient has a schedule appointment in the morning. 04/05/2021. Patient voice understanding at this time.

## 2021-04-05 ENCOUNTER — Ambulatory Visit (INDEPENDENT_AMBULATORY_CARE_PROVIDER_SITE_OTHER): Payer: Managed Care, Other (non HMO) | Admitting: Women's Health

## 2021-04-05 ENCOUNTER — Other Ambulatory Visit: Payer: Self-pay

## 2021-04-05 VITALS — BP 127/78 | HR 101 | Wt 161.0 lb

## 2021-04-05 DIAGNOSIS — Z148 Genetic carrier of other disease: Secondary | ICD-10-CM | POA: Insufficient documentation

## 2021-04-05 DIAGNOSIS — Z3A24 24 weeks gestation of pregnancy: Secondary | ICD-10-CM

## 2021-04-05 DIAGNOSIS — B951 Streptococcus, group B, as the cause of diseases classified elsewhere: Secondary | ICD-10-CM

## 2021-04-05 DIAGNOSIS — R87612 Low grade squamous intraepithelial lesion on cytologic smear of cervix (LGSIL): Secondary | ICD-10-CM

## 2021-04-05 DIAGNOSIS — D563 Thalassemia minor: Secondary | ICD-10-CM | POA: Insufficient documentation

## 2021-04-05 DIAGNOSIS — R630 Anorexia: Secondary | ICD-10-CM

## 2021-04-05 DIAGNOSIS — O0990 Supervision of high risk pregnancy, unspecified, unspecified trimester: Secondary | ICD-10-CM

## 2021-04-05 DIAGNOSIS — O2342 Unspecified infection of urinary tract in pregnancy, second trimester: Secondary | ICD-10-CM

## 2021-04-05 DIAGNOSIS — O10919 Unspecified pre-existing hypertension complicating pregnancy, unspecified trimester: Secondary | ICD-10-CM

## 2021-04-05 MED ORDER — LABETALOL HCL 100 MG PO TABS: 100.0000 mg | ORAL_TABLET | Freq: Two times a day (BID) | ORAL | 3 refills | Status: DC

## 2021-04-05 NOTE — Patient Instructions (Addendum)
Maternity Assessment Unit (MAU)  The Maternity Assessment Unit (MAU) is located at the [facility] and [facility] at [facility]. The address is: [address] [place], [place], [place] [address]. Please see map below for additional directions.    The Maternity Assessment Unit is designed to help you during your pregnancy, and for up to 6 weeks after delivery, with any pregnancy- or postpartum-related emergencies, if you think you are in labor, or if your water has broken. For example, if you experience nausea and vomiting, vaginal bleeding, severe abdominal or pelvic pain, elevated blood pressure or other problems related to your pregnancy or postpartum time, please come to the Maternity Assessment Unit for assistance.       Childbirth Education Options: [facility] Department Classes:  Childbirth education classes can help you get ready for a positive parenting experience. You can also meet other expectant parents and get free stuff for your baby. Each class runs for five weeks on the same night and costs $45 for the mother-to-be and her support person. Medicaid covers the cost if you are eligible. Call [phone] to register. [facility] Childbirth Education:  [phone] or [phone] or [email or web address]  Baby & Me Class: Discuss newborn & infant parenting and family adjustment issues with other new mothers in a relaxed environment. Each week brings a new speaker or baby-centered activity. We encourage new mothers to join [place] every Thursday at 11:00am. Babies birth until crawling. No registration or fee. Daddy [employer]: This course offers Dads-to-be the tools and knowledge needed to feel confident on their journey to becoming new fathers. Experienced dads, who have been trained as coaches, teach dads-to-be how to hold, comfort, diaper, swaddle and play with their infant while being able to support the new mom as well. A class for men taught by  men. $25/dad Big Brother/Big Sister: Let your children share in the joy of a new brother or sister in this special class designed just for them. Class includes discussion about how families care for babies: swaddling, holding, diapering, safety as well as how they can be helpful in their new role. This class is designed for children ages 2 to 39, but any age is welcome. Please register each child individually. $5/child  Mom Talk: This mom-led group offers support and connection to mothers as they journey through the adjustments and struggles of that sometimes overwhelming first year after the birth of a child. Tuesdays at 10:00am and Thursdays at 6:00pm. Babies welcome. No registration or fee. Breastfeeding Support Group: This group is a mother-to-mother support circle where moms have the opportunity to share their breastfeeding experiences. A Lactation Consultant is present for questions and concerns. Meets each Tuesday at 11:00am. No fee or registration. Breastfeeding Your Baby: Learn what to expect in the first days of breastfeeding your newborn.  This class will help you feel more confident with the skills needed to begin your breastfeeding experience. Many new mothers are concerned about breastfeeding after leaving the hospital. This class will also address the most common fears and challenges about breastfeeding during the first few weeks, months and beyond. (call for fee) Comfort Techniques and Tour: This 2 hour interactive class will provide you the opportunity to learn & practice hands-on techniques that can help relieve some of the discomfort of labor and encourage your baby to rotate toward the best position for birth. You and your partner will be able to try a variety of labor positions with birth balls and rebozos as well  as practice breathing, relaxation, and visualization techniques. A tour of the [facility] is included with this class. $20 per registrant and support  person Childbirth Class- Weekend Option: This class is a Weekend version of our Birth & Baby series. It is designed for parents who have a difficult time fitting several weeks of classes into their schedule. It covers the care of your newborn and the basics of labor and childbirth. It also includes a Maternity Care Center Tour of [facility] and lunch. The class is held two consecutive days: beginning on Friday evening from 6:30 - 8:30 p.m. and the next day, Saturday from 9 a.m. - 4 p.m. (call for fee) Linden Dolin Class: Interested in a waterbirth?  This informational class will help you discover whether waterbirth is the right fit for you. Education about waterbirth itself, supplies you would need and how to assemble your support team is what you can expect from this class. Some obstetrical practices require this class in order to pursue a waterbirth. (Not all obstetrical practices offer waterbirth-check with your healthcare provider.) Register only the expectant mom, but you are encouraged to bring your partner to class! Required if planning waterbirth, no fee. Infant/Child CPR: Parents, grandparents, babysitters, and friends learn Cardio-Pulmonary Resuscitation skills for infants and children. You will also learn how to treat both conscious and unconscious choking in infants and children. This Family & Friends program does not offer certification. Register each participant individually to ensure that enough mannequins are available. (Call for fee) Grandparent Love: Expecting a grandbaby? This class is for you! Learn about the latest infant care and safety recommendations and ways to support your own child as he or she transitions into the parenting role. Taught by Registered Nurses who are childbirth instructors, but most importantly...they are grandmothers too! $10/person. Childbirth Class- Natural Childbirth: This series of 5 weekly classes is for expectant parents who want to learn and practice  natural methods of coping with the process of labor and childbirth. Relaxation, breathing, massage, visualization, role of the partner, and helpful positioning are highlighted. Participants learn how to be confident in their body's ability to give birth. This class will empower and help parents make informed decisions about their own care. Includes discussion that will help new parents transition into the immediate postpartum period. Maternity Care Center Tour of [facility] is included. We suggest taking this class between 25-32 weeks, but it's only a recommendation. $75 per registrant and one support person or $30 Medicaid. Childbirth Class- 3 week Series: This option of 3 weekly classes helps you and your labor partner prepare for childbirth. Newborn care, labor & birth, cesarean birth, pain management, and comfort techniques are discussed and a Maternity Care Center Tour of [facility] is included. The class meets at the same time, on the same day of the week for 3 consecutive weeks beginning with the starting date you choose. $60 for registrant and one support person.  Marvelous Multiples: Expecting twins, triplets, or more? This class covers the differences in labor, birth, parenting, and breastfeeding issues that face multiples' parents. NICU tour is included. Led by a Certified Childbirth Educator who is the mother of twins. No fee. Caring for Baby: This class is for expectant and adoptive parents who want to learn and practice the most up-to-date newborn care for their babies. Focus is on birth through the first six weeks of life. Topics include feeding, bathing, diapering, crying, umbilical cord care, circumcision care and safe sleep. Parents learn to  recognize symptoms of illness and when to call the pediatrician. Register only the mom-to-be and your partner or support person can plan to come with you! $10 per registrant and support person Childbirth Class- online option: This online class  offers you the freedom to complete a Birth and Baby series in the comfort of your own home. The flexibility of this option allows you to review sections at your own pace, at times convenient to you and your support people. It includes additional video information, animations, quizzes, and extended activities. Get organized with helpful eClass tools, checklists, and trackers. Once you register online for the class, you will receive an email within a few days to accept the invitation and begin the class when the time is right for you. The content will be available to you for 60 days. $60 for 60 days of online access for you and your support people.                     Oral Glucose Tolerance Test During Pregnancy Why am I having this test? The oral glucose tolerance test (OGTT) is done to check how your body processes blood sugar (glucose). This is one of several tests used to diagnose diabetes that develops during pregnancy (gestational diabetes mellitus). Gestational diabetes is a short-term form of diabetes that some women develop while they are pregnant. It usually occurs during the second trimester of pregnancy and goes away after delivery. Testing, or screening, for gestational diabetes usually occurs at weeks 24-28 of pregnancy. You may have the OGTT test after having a 1-hour glucose screening test if the results from that test indicate that you may have gestational diabetes. This test may also be needed if:  You have a history of gestational diabetes.  There is a history of giving birth to very large babies or of losing pregnancies (having stillbirths).  You have signs and symptoms of diabetes, such as: ? Changes in your eyesight. ? Tingling or numbness in your hands or feet. ? Changes in hunger, thirst, and urination, and these are not explained by your pregnancy. What is being tested? This test measures the amount of glucose in your blood at different times during a period  of 3 hours. This shows how well your body can process glucose. What kind of sample is taken? Blood samples are required for this test. They are usually collected by inserting a needle into a blood vessel.   How do I prepare for this test?  For 3 days before your test, eat normally. Have plenty of carbohydrate-rich foods.  Follow instructions from your health care provider about: ? Eating or drinking restrictions on the day of the test. You may be asked not to eat or drink anything other than water (to fast) starting 8-10 hours before the test. ? Changing or stopping your regular medicines. Some medicines may interfere with this test. Tell a health care provider about:  All medicines you are taking, including vitamins, herbs, eye drops, creams, and over-the-counter medicines.  Any blood disorders you have.  Any surgeries you have had.  Any medical conditions you have. What happens during the test? First, your blood glucose will be measured. This is referred to as your fasting blood glucose because you fasted before the test. Then, you will drink a glucose solution that contains a certain amount of glucose. Your blood glucose will be measured again 1, 2, and 3 hours after you drink the solution. This test takes about 3 hours  to complete. You will need to stay at the testing location during this time. During the testing period:  Do not eat or drink anything other than the glucose solution.  Do not exercise.  Do not use any products that contain nicotine or tobacco, such as cigarettes, e-cigarettes, and chewing tobacco. These can affect your test results. If you need help quitting, ask your health care provider. The testing procedure may vary among health care providers and hospitals. How are the results reported? Your results will be reported as milligrams of glucose per deciliter of blood (mg/dL) or millimoles per liter (mmol/L). There is more than one source for screening and diagnosis  reference values used to diagnose gestational diabetes. Your health care provider will compare your results to normal values that were established after testing a large group of people (reference values). Reference values may vary among labs and hospitals. For this test (Carpenter-Coustan), reference values are:  Fasting: 95 mg/dL (5.3 mmol/L).  1 hour: 180 mg/dL (16.1 mmol/L).  2 hour: 155 mg/dL (8.6 mmol/L).  3 hour: 140 mg/dL (7.8 mmol/L). What do the results mean? Results below the reference values are considered normal. If two or more of your blood glucose levels are at or above the reference values, you may be diagnosed with gestational diabetes. If only one level is high, your health care provider may suggest repeat testing or other tests to confirm a diagnosis. Talk with your health care provider about what your results mean. Questions to ask your health care provider Ask your health care provider, or the department that is doing the test:  When will my results be ready?  How will I get my results?  What are my treatment options?  What other tests do I need?  What are my next steps? Summary  The oral glucose tolerance test (OGTT) is one of several tests used to diagnose diabetes that develops during pregnancy (gestational diabetes mellitus). Gestational diabetes is a short-term form of diabetes that some women develop while they are pregnant.  You may have the OGTT test after having a 1-hour glucose screening test if the results from that test show that you may have gestational diabetes. You may also have this test if you have any symptoms or risk factors for this type of diabetes.  Talk with your health care provider about what your results mean. This information is not intended to replace advice given to you by your health care provider. Make sure you discuss any questions you have with your health care provider. Document Revised: 05/26/2020 Document Reviewed:  05/26/2020 Elsevier Patient Education  2021 Elsevier Inc.        Hypertension During Pregnancy High blood pressure (hypertension) is when the force of blood pumping through the arteries is high enough to cause problems with your health. Arteries are blood vessels that carry blood from the heart throughout the body. Hypertension during pregnancy can cause problems for you and your baby. It can be mild or severe. There are different types of hypertension that can happen during pregnancy. These include:  Chronic hypertension. This happens when you had high blood pressure before you became pregnant, and it continues during the pregnancy. Hypertension that develops before you are [redacted] weeks pregnant and continues during the pregnancy is also called chronic hypertension. If you have chronic hypertension, it will not go away after you have your baby. You will need follow-up visits with your health care provider after you have your baby. Your health care provider may want  you to keep taking medicine for your blood pressure.  Gestational hypertension. This is hypertension that develops after the 20th week of pregnancy. Gestational hypertension usually goes away after you have your baby, but your health care provider will need to monitor your blood pressure to make sure that it is getting better.  Postpartum hypertension. This is high blood pressure that was present before delivery and continues after delivery or that starts after delivery. This usually occurs within 48 hours after childbirth but may occur up to 6 weeks after giving birth. When hypertension during pregnancy is severe, it is a medical emergency that requires treatment right away. How does this affect me? Women who have hypertension during pregnancy have a greater chance of developing hypertension later in life or during future pregnancies. In some cases, hypertension during pregnancy can cause serious complications, such  as:  Stroke.  Heart attack.  Injury to other organs, such as kidneys, lungs, or liver.  Preeclampsia.  A condition called hemolysis, elevated liver enzymes, and low platelet count (HELLP) syndrome.  Convulsions or seizures.  Placental abruption. How does this affect my baby? Hypertension during pregnancy can affect your baby. Your baby may:  Be born early (prematurely).  Not weigh as much as he or she should at birth (low birth weight).  Not tolerate labor well, leading to an unplanned cesarean delivery. This condition may also result in a baby's death before birth (stillbirth). What are the risks? There are certain factors that make it more likely for you to develop hypertension during pregnancy. These include:  Having hypertension during a previous pregnancy or a family history of hypertension.  Being overweight.  Being age 71 or older.  Being pregnant for the first time.  Being pregnant with more than one baby.  Becoming pregnant using fertilization methods, such as IVF (in vitro fertilization).  Having other medical problems, such as diabetes, kidney disease, or lupus. What can I do to lower my risk? The exact cause of hypertension during pregnancy is not known. You may be able to lower your risk by:  Maintaining a healthy weight.  Eating a healthy and balanced diet.  Following your health care provider's instructions about treating any long-term conditions that you had before becoming pregnant. It is very important to keep all of your prenatal care appointments. Your health care provider will check your blood pressure and make sure that your pregnancy is progressing as expected. If a problem is found, early treatment can prevent complications.   How is this treated? Treatment for hypertension during pregnancy varies depending on the type of hypertension you have and how serious it is.  If you were taking medicine for high blood pressure before you became  pregnant, talk with your health care provider. You may need to change medicine during pregnancy because some medicines, like ACE inhibitors, may not be considered safe for your baby.  If you have gestational hypertension, your health care provider may order medicine to treat this during pregnancy.  If you are at risk for preeclampsia, your health care provider may recommend that you take a low-dose aspirin during your pregnancy.  If you have severe hypertension, you may need to be hospitalized so you and your baby can be monitored closely. You may also need to be given medicine to lower your blood pressure.  In some cases, if your condition gets worse, you may need to deliver your baby early. Follow these instructions at home: Eating and drinking  Drink enough fluid to keep  your urine pale yellow.  Avoid caffeine.   Lifestyle  Do not use any products that contain nicotine or tobacco. These products include cigarettes, chewing tobacco, and vaping devices, such as e-cigarettes. If you need help quitting, ask your health care provider.  Do not use alcohol or drugs.  Avoid stress as much as possible.  Rest and get plenty of sleep.  Regular exercise can help to reduce your blood pressure. Ask your health care provider what kinds of exercise are best for you. General instructions  Take over-the-counter and prescription medicines only as told by your health care provider.  Keep all prenatal and follow-up visits. This is important. Contact a health care provider if:  You have symptoms that your health care provider told you may require more treatment or monitoring, such as: ? Headaches. ? Nausea or vomiting. ? Abdominal pain. ? Dizziness. ? Light-headedness. Get help right away if:  You have symptoms of serious complications, such as: ? Severe abdominal pain that does not get better with treatment. ? A severe headache that does not get better, blurred vision, or double  vision. ? Vomiting that does not get better. ? Sudden, rapid weight gain or swelling in your hands, ankles, or face. ? Vaginal bleeding. ? Blood in your urine. ? Shortness of breath or chest pain. ? Weakness on one side of your body or difficulty speaking.  Your baby is not moving as much as usual. These symptoms may represent a serious problem that is an emergency. Do not wait to see if the symptoms will go away. Get medical help right away. Call your local emergency services (911 in the [place] Do not drive yourself to the hospital. Summary  Hypertension during pregnancy can cause problems for you and your baby.  Treatment for hypertension during pregnancy varies depending on the type of hypertension you have and how serious it is.  Keep all prenatal and follow-up visits. This is important.  Get help right away if you have symptoms of serious complications related to high blood pressure. This information is not intended to replace advice given to you by your health care provider. Make sure you discuss any questions you have with your health care provider. Document Revised: 09/08/2020 Document Reviewed: 09/08/2020 Elsevier Patient Education  2021 Elsevier Inc.        Eating Plan for Pregnant Women While you are pregnant, your body requires additional nutrition to help support your growing baby. You also have a higher need for some vitamins and minerals, such as folic acid, calcium, iron, and vitamin D. Eating a healthy, well-balanced diet is very important for your health and your baby's health. Your need for extra calories varies over the course of your pregnancy. Pregnancy is divided into three trimesters, with each trimester lasting 3 months. For most women, it is recommended to consume:  150 extra calories a day during the first trimester.  300 extra calories a day during the second trimester.  300 extra calories a day during the third trimester. What are tips for following  this plan? Cooking  Practice good food safety and cleanliness. Wash your hands before you eat and after you prepare raw meat. Wash all fruits and vegetables well before peeling or eating. Taking these actions can help to prevent foodborne illnesses that can be very dangerous to your baby, such as listeriosis. Ask your health care provider for more information about listeriosis.  Make sure that all meats, poultry, and eggs are cooked to food-safe temperatures or "well-done."  Meal planning  Eat a variety of foods (especially fruits and vegetables) to get a full range of vitamins and minerals.  Two or more servings of fish are recommended each week in order to get the most benefits from omega-3 fatty acids that are found in seafood. Choose fish that are lower in mercury, such as salmon and pollock.  Limit your overall intake of foods that have "empty calories." These are foods that have little nutritional value, such as sweets, desserts, candies, and sugar-sweetened beverages.  Drinks that contain caffeine are okay to drink, but it is better to avoid caffeine. Keep your total caffeine intake to less than 200 mg each day (which is 12 oz or 355 mL of coffee, tea, or soda) or the limit as told by your health care provider.   General information  Do not try to lose weight or go on a diet during pregnancy.  Take a prenatal vitamin to help meet your additional vitamin and mineral needs during pregnancy, specifically for folic acid, iron, calcium, and vitamin D.  Remember to stay active. Ask your health care provider what types of exercise and activities are safe for you. What does 150 extra calories look like? Healthy options that provide 150 extra calories each day could be any of the following:  6-8 oz (170-227 g) plain low-fat yogurt with  cup (70 g) berries.  1 apple with 2 tsp (11 g) peanut butter.  Cut-up vegetables with  cup (60 g) hummus.  8 fl oz (237 mL) low-fat chocolate milk.  1  stick of string cheese with 1 medium orange.  1 peanut butter and jelly sandwich that is made with one slice of whole-wheat bread and 1 tsp (5 g) of peanut butter. For 300 extra calories, you could eat two of these healthy options each day. What is a healthy amount of weight to gain? The right amount of weight gain for you is based on your BMI (body mass index) before you became pregnant.  If your BMI was less than 18 (underweight), you should gain 28-40 lb (13-18 kg).  If your BMI was 18-24.9 (normal), you should gain 25-35 lb (11-16 kg).  If your BMI was 25-29.9 (overweight), you should gain 15-25 lb (7-11 kg).  If your BMI was 30 or greater (obese), you should gain 11-20 lb (5-9 kg). What if I am having twins or multiples? Generally, if you are carrying twins or multiples:  You may need to eat 300-600 extra calories a day.  The recommended range for total weight gain is 25-54 lb (11-25 kg), depending on your BMI before pregnancy.  Talk with your health care provider to find out about nutritional needs, weight gain, and exercise that is right for you. What foods should I eat? Fruits All fruits. Eat a variety of colors and types of fruit. Remember to wash your fruits well before peeling or eating. Vegetables All vegetables. Eat a variety of colors and types of vegetables. Remember to wash your vegetables well before peeling or eating. Grains All grains. Choose whole grains, such as whole-wheat bread, oatmeal, or brown rice. Meats and other protein foods Lean meats, including chicken, [place], and lean cuts of beef, veal, or pork. Fish that is higher in omega-3 fatty acids and lower in mercury, such as salmon, herring, mussels, trout, sardines, pollock, shrimp, crab, and lobster. Tofu. Tempeh. Beans. Eggs. Peanut butter and other nut butters. Dairy Pasteurized milk and milk alternatives, such as almond milk. Pasteurized yogurt and pasteurized cheese.  Cottage cheese. Sour  cream. Beverages Water. Juices that contain 100% fruit juice or vegetable juice. Caffeine-free teas and decaffeinated coffee. Fats and oils Fats and oils are okay to include in moderation. Sweets and desserts Sweets and desserts are okay to include in moderation. Seasoning and other foods All pasteurized condiments. The items listed above may not be a complete list of foods and beverages you can eat. Contact a dietitian for more information.   What foods should I avoid? Fruits Raw (unpasteurized) fruit juices. Vegetables Unpasteurized vegetable juices. Meats and other protein foods Precooked or cured meat, such as bologna, hot dogs, sausages, or meat loaves. (If you must eat those meats, reheat them until they are steaming hot.) Refrigerated pate, meat spreads from a meat counter, or smoked seafood that is found in the refrigerated section of a store. Raw or undercooked meats, poultry, and eggs. Raw fish, such as sushi or sashimi. Fish that have high mercury content, such as tilefish, shark, swordfish, and king mackerel. Dairy Unpasteurized milk and any foods that have unpasteurized milk in them. Soft cheeses, such as feta, queso blanco, queso fresco, [place], [place], panela, and blue-veined cheeses (unless they are made with pasteurized milk, which must be stated on the label). Beverages Alcohol. Sugar-sweetened beverages, such as sodas, teas, or energy drinks. Seasoning and other foods Homemade fermented foods and drinks, such as pickles, sauerkraut, or kombucha drinks. (Store-bought pasteurized versions of these are okay.) Salads that are made in a store or deli, such as ham salad, chicken salad, egg salad, tuna salad, and seafood salad. The items listed above may not be a complete list of foods and beverages you should avoid. Contact a dietitian for more information. Where to find more information To calculate the number of calories you need based on your height, weight, and activity  level, you can use an online calculator such as:  PayStrike.dk To calculate how much weight you should gain during pregnancy, you can use an online pregnancy weight gain calculator such as:  http://www.harvey.com/ To learn more about eating fish during pregnancy, talk with your health care provider or visit:  PumpkinSearch.com.ee Summary  While you are pregnant, your body requires additional nutrition to help support your growing baby.  Eat a variety of foods, especially fruits and vegetables, to get a full range of vitamins and minerals.  Practice good food safety and cleanliness. Wash your hands before you eat and after you prepare raw meat. Wash all fruits and vegetables well before peeling or eating. Taking these actions can help to prevent foodborne illnesses, such as listeriosis, that can be very dangerous to your baby.  Do not eat raw meat or fish. Do not eat fish that have high mercury content, such as tilefish, shark, swordfish, and king mackerel. Do not eat raw (unpasteurized) dairy.  Take a prenatal vitamin to help meet your additional vitamin and mineral needs during pregnancy, specifically for folic acid, iron, calcium, and vitamin D. This information is not intended to replace advice given to you by your health care provider. Make sure you discuss any questions you have with your health care provider. Document Revised: 07/14/2020 Document Reviewed: 07/14/2020 Elsevier Patient Education  2021 Elsevier Inc.        Preterm Labor The normal length of a pregnancy is 39-41 weeks. Preterm labor is when labor starts before 37 completed weeks of pregnancy. Babies who are born prematurely and survive may not be fully developed and may be at an increased risk for long-term problems such  as cerebral palsy, developmental delays, and vision and hearing problems. Babies who are born too early may have problems soon after birth. Problems may include regulating blood sugar, body  temperature, heart rate, and breathing rate. These babies often have trouble with feeding. The risk of having problems is highest for babies who are born before 34 weeks of pregnancy. What are the causes? The exact cause of this condition is not known. What increases the risk? You are more likely to have preterm labor if you have certain risk factors that relate to your medical history, problems with present and past pregnancies, and lifestyle factors. Medical history  You have abnormalities of the uterus, including a short cervix.  You have STIs (sexually transmitted infections), or other infections of the urinary tract and the vagina.  You have chronic illnesses, such as blood clotting problems, diabetes, or high blood pressure.  You are overweight or underweight. Present and past pregnancies  You have had preterm labor before.  You are pregnant with twins or other multiples.  You have been diagnosed with a condition in which the placenta covers your cervix (placenta previa).  You waited less than 6 months between giving birth and becoming pregnant again.  Your unborn baby has some abnormalities.  You have vaginal bleeding during pregnancy.  You became pregnant through in vitro fertilization (IVF). Lifestyle and environmental factors  You use tobacco products.  You drink alcohol.  You use street drugs.  You have stress and no social support.  You experience domestic violence.  You are exposed to certain chemicals or environmental pollutants. Other factors  You are younger than age 62 or older than age 22. What are the signs or symptoms? Symptoms of this condition include:  Cramps similar to those that can happen during a menstrual period. The cramps may happen with diarrhea.  Pain in the abdomen or lower back.  Regular contractions that may feel like tightening of the abdomen.  A feeling of increased pressure in the pelvis.  Increased watery or bloody mucus  discharge from the vagina.  Water breaking (ruptured amniotic sac). How is this diagnosed? This condition is diagnosed based on:  Your medical history and a physical exam.  A pelvic exam.  An ultrasound.  Monitoring your uterus for contractions.  Other tests, including: ? A swab of the cervix to check for a chemical called fetal fibronectin. ? Urine tests. How is this treated? Treatment for this condition depends on the length of your pregnancy, your condition, and the health of your baby. Treatment may include:  Taking medicines, such as: ? Hormone medicines. These may be given early in pregnancy to help support the pregnancy. ? Medicines to stop contractions. ? Medicines to help mature the baby's lungs. These may be prescribed if the risk of delivery is high. ? Medicines to prevent your baby from developing cerebral palsy.  Bed rest. If the labor happens before 34 weeks of pregnancy, you may need to stay in the hospital.  Delivery of the baby. Follow these instructions at home:  Do not use any products that contain nicotine or tobacco, such as cigarettes, e-cigarettes, and chewing tobacco. If you need help quitting, ask your health care provider.  Do not drink alcohol.  Take over-the-counter and prescription medicines only as told by your health care provider.  Rest as told by your health care provider.  Return to your normal activities as told by your health care provider. Ask your health care provider what activities are safe  for you.  Keep all follow-up visits as told by your health care provider. This is important.   How is this prevented? To increase your chance of having a full-term pregnancy:  Do not use street drugs or medicines that have not been prescribed to you during your pregnancy.  Talk with your health care provider before taking any herbal supplements, even if you have been taking them regularly.  Make sure you gain a healthy amount of weight during  your pregnancy.  Watch for infection. If you think that you might have an infection, get it checked right away. Symptoms of infection may include: ? Fever. ? Abnormal vaginal discharge or discharge that smells bad. ? Pain or burning with urination. ? Needing to urinate urgently. ? Frequently urinating or passing small amounts of urine frequently. ? Blood in your urine. ? Urine that smells bad or unusual.  Tell your health care provider if you have had preterm labor before. Contact a health care provider if:  You think you are going into preterm labor.  You have signs or symptoms of preterm labor.  You have symptoms of infection. Get help right away if:  You are having regular, painful contractions every 5 minutes or less.  Your water breaks. Summary  Preterm labor is labor that starts before you reach 37 weeks of pregnancy.  Delivering your baby early increases your baby's risk of developing lifelong problems.  The exact cause of preterm labor is unknown. However, having an abnormal uterus, an STI (sexually transmitted infection), or vaginal bleeding during pregnancy increases your risk for preterm labor.  Keep all follow-up visits as told by your health care provider. This is important.  Contact a health care provider if you have signs or symptoms of preterm labor. This information is not intended to replace advice given to you by your health care provider. Make sure you discuss any questions you have with your health care provider. Document Revised: 01/19/2020 Document Reviewed: 01/19/2020 Elsevier Patient Education  2021 [employer]

## 2021-04-05 NOTE — Progress Notes (Signed)
+   Fetal movement. Pt has not been taking BP meds. States they are making her dizzy. Pt has concerns about not gaining weight and size of baby. Next ultrasound is not until April 22.

## 2021-04-05 NOTE — Progress Notes (Addendum)
Subjective:  Michelle Thompson is a 25 y.o. G3P2002 at [redacted]w[redacted]d being seen today for ongoing prenatal care.  She is currently monitored for the following issues for this high-risk pregnancy and has Chronic hypertension affecting pregnancy; Supervision of high risk pregnancy, antepartum; History of gestational hypertension; GBS (group B streptococcus) UTI complicating pregnancy; LGSIL on Pap smear of cervix; Alpha thalassemia silent carrier; and Genetic carrier on their problem list.  Patient reports no complaints.  Contractions: Not present. Vag. Bleeding: None.  Movement: Present. Denies leaking of fluid.   The following portions of the patient's history were reviewed and updated as appropriate: allergies, current medications, past family history, past medical history, past social history, past surgical history and problem list. Problem list updated.  Objective:   Vitals:   04/05/21 0818 04/05/21 0821  BP: (!) 143/84 127/78  Pulse: (!) 101   Weight: 161 lb (73 kg)     Fetal Status: Fetal Heart Rate (bpm): 146 Fundal Height: 24 cm Movement: Present     General:  Alert, oriented and cooperative. Patient is in no acute distress.  Skin: Skin is warm and dry. No rash noted.   Cardiovascular: Normal heart rate noted  Respiratory: Normal respiratory effort, no problems with respiration noted  Abdomen: Soft, gravid, appropriate for gestational age. Pain/Pressure: Present     Pelvic: Vag. Bleeding: None     Cervical exam deferred        Extremities: Normal range of motion.  Edema: None  Mental Status: Normal mood and affect. Normal behavior. Normal judgment and thought content.   Urinalysis:      Assessment and Plan:  Pregnancy: G3P2002 at [redacted]w[redacted]d  1. Supervision of high risk pregnancy, antepartum -CBE info given -anticipatory guidance on upcoming visits  2. LGSIL on Pap smear of cervix -on 01/09/2021, repeat in one year  3. Group B Streptococcus urinary tract infection affecting pregnancy  in second trimester -treat in labor  4. Chronic hypertension affecting pregnancy -pt started on Procardia 30mg  XL at MAU visit on 03/17/2021, but patient reports not taking as it makes her feel dizzy -per consultation with Dr. 03/19/2021, will start on Labetalol 100mg  BID -recheck BP in 1 week -on low dose ASA daily -RX labetalol  5. [redacted] weeks gestation of pregnancy  6. Alpha thalassemia silent carrier - AMB MFM GENETICS REFERRAL  7. Genetic carrier - AMB MFM GENETICS REFERRAL  8. Appetite loss -weight gain this pregnancy 11lbs, pt concerned as she feels like she is losing weight and does not have much of an appetite -advised nutrient dense foods and referral entered for nutrition counseling - Referral to Nutrition and Diabetes Services  Preterm labor symptoms and general obstetric precautions including but not limited to vaginal bleeding, contractions, leaking of fluid and fetal movement were reviewed in detail with the patient. I discussed the assessment and treatment plan with the patient. The patient was provided an opportunity to ask questions and all were answered. The patient agreed with the plan and demonstrated an understanding of the instructions. The patient was advised to call back or seek an in-person office evaluation/go to [facility] at [facility] for any urgent or concerning symptoms. Please refer to After Visit Summary for other counseling recommendations.  Return in about 1 week (around 04/12/2021) for one week, BP check only, 4 weeks in-person LOB/APP OK/GTT/labs, needs genetics appt, nutritionist ap.   Chalsea Darko, [facility], NP

## 2021-04-12 ENCOUNTER — Ambulatory Visit: Payer: Managed Care, Other (non HMO)

## 2021-04-17 ENCOUNTER — Ambulatory Visit: Payer: Managed Care, Other (non HMO)

## 2021-04-21 ENCOUNTER — Ambulatory Visit: Payer: Managed Care, Other (non HMO) | Attending: Obstetrics

## 2021-04-21 ENCOUNTER — Ambulatory Visit: Payer: Managed Care, Other (non HMO) | Admitting: *Deleted

## 2021-04-21 ENCOUNTER — Other Ambulatory Visit: Payer: Self-pay

## 2021-04-21 ENCOUNTER — Encounter: Payer: Self-pay | Admitting: *Deleted

## 2021-04-21 ENCOUNTER — Other Ambulatory Visit: Payer: Self-pay | Admitting: *Deleted

## 2021-04-21 DIAGNOSIS — O2690 Pregnancy related conditions, unspecified, unspecified trimester: Secondary | ICD-10-CM | POA: Diagnosis present

## 2021-04-21 DIAGNOSIS — O10912 Unspecified pre-existing hypertension complicating pregnancy, second trimester: Secondary | ICD-10-CM

## 2021-04-21 DIAGNOSIS — R87612 Low grade squamous intraepithelial lesion on cytologic smear of cervix (LGSIL): Secondary | ICD-10-CM | POA: Diagnosis present

## 2021-04-21 DIAGNOSIS — O10012 Pre-existing essential hypertension complicating pregnancy, second trimester: Secondary | ICD-10-CM

## 2021-04-21 DIAGNOSIS — Z148 Genetic carrier of other disease: Secondary | ICD-10-CM | POA: Diagnosis present

## 2021-04-21 DIAGNOSIS — O0990 Supervision of high risk pregnancy, unspecified, unspecified trimester: Secondary | ICD-10-CM | POA: Insufficient documentation

## 2021-04-21 DIAGNOSIS — D563 Thalassemia minor: Secondary | ICD-10-CM | POA: Diagnosis present

## 2021-04-21 DIAGNOSIS — Z362 Encounter for other antenatal screening follow-up: Secondary | ICD-10-CM | POA: Diagnosis present

## 2021-04-24 ENCOUNTER — Encounter: Payer: Managed Care, Other (non HMO) | Attending: Women's Health | Admitting: Dietician

## 2021-05-03 ENCOUNTER — Other Ambulatory Visit: Payer: Managed Care, Other (non HMO)

## 2021-05-03 ENCOUNTER — Encounter: Payer: Managed Care, Other (non HMO) | Admitting: Women's Health

## 2021-05-11 ENCOUNTER — Other Ambulatory Visit: Payer: Managed Care, Other (non HMO)

## 2021-05-11 ENCOUNTER — Encounter: Payer: Managed Care, Other (non HMO) | Admitting: Student

## 2021-05-11 DIAGNOSIS — O0990 Supervision of high risk pregnancy, unspecified, unspecified trimester: Secondary | ICD-10-CM

## 2021-05-17 ENCOUNTER — Ambulatory Visit (INDEPENDENT_AMBULATORY_CARE_PROVIDER_SITE_OTHER): Payer: Managed Care, Other (non HMO) | Admitting: Obstetrics and Gynecology

## 2021-05-17 ENCOUNTER — Other Ambulatory Visit: Payer: Self-pay

## 2021-05-17 ENCOUNTER — Encounter: Payer: Self-pay | Admitting: Obstetrics and Gynecology

## 2021-05-17 ENCOUNTER — Other Ambulatory Visit: Payer: Managed Care, Other (non HMO)

## 2021-05-17 ENCOUNTER — Encounter: Payer: Managed Care, Other (non HMO) | Admitting: Obstetrics

## 2021-05-17 VITALS — BP 114/78 | HR 102 | Wt 161.0 lb

## 2021-05-17 DIAGNOSIS — B951 Streptococcus, group B, as the cause of diseases classified elsewhere: Secondary | ICD-10-CM

## 2021-05-17 DIAGNOSIS — Z148 Genetic carrier of other disease: Secondary | ICD-10-CM

## 2021-05-17 DIAGNOSIS — O2343 Unspecified infection of urinary tract in pregnancy, third trimester: Secondary | ICD-10-CM

## 2021-05-17 DIAGNOSIS — O0990 Supervision of high risk pregnancy, unspecified, unspecified trimester: Secondary | ICD-10-CM

## 2021-05-17 DIAGNOSIS — O10919 Unspecified pre-existing hypertension complicating pregnancy, unspecified trimester: Secondary | ICD-10-CM

## 2021-05-17 NOTE — Progress Notes (Signed)
   PRENATAL VISIT NOTE  Subjective:  Michelle Thompson is a 25 y.o. G3P2002 at [redacted]w[redacted]d being seen today for ongoing prenatal care.  She is currently monitored for the following issues for this high-risk pregnancy and has Chronic hypertension affecting pregnancy; Supervision of high risk pregnancy, antepartum; History of gestational hypertension; GBS (group B streptococcus) UTI complicating pregnancy; LGSIL on Pap smear of cervix; Alpha thalassemia silent carrier; and Genetic carrier on their problem list.  Patient reports no complaints.  Contractions: Not present. Vag. Bleeding: None.  Movement: Present. Denies leaking of fluid.   The following portions of the patient's history were reviewed and updated as appropriate: allergies, current medications, past family history, past medical history, past social history, past surgical history and problem list.   Objective:   Vitals:   05/17/21 0858  BP: 114/78  Pulse: (!) 102  Weight: 161 lb (73 kg)    Fetal Status: Fetal Heart Rate (bpm): 138 Fundal Height: 29 cm Movement: Present     General:  Alert, oriented and cooperative. Patient is in no acute distress.  Skin: Skin is warm and dry. No rash noted.   Cardiovascular: Normal heart rate noted  Respiratory: Normal respiratory effort, no problems with respiration noted  Abdomen: Soft, gravid, appropriate for gestational age.  Pain/Pressure: Absent     Pelvic: Cervical exam deferred        Extremities: Normal range of motion.  Edema: None  Mental Status: Normal mood and affect. Normal behavior. Normal judgment and thought content.   Assessment and Plan:  Pregnancy: G3P2002 at [redacted]w[redacted]d 1. Supervision of high risk pregnancy, antepartum Patient is doing well without complaints Third trimester labs and glucola today  2. Chronic hypertension affecting pregnancy Stable without symptoms of preeclampsia Continue ASA  3. Genetic carrier Scheduled for genetic counseling but unable to keep appointments  due to work schedule Discussed availability of virtual appointments  4. Group B Streptococcus urinary tract infection affecting pregnancy in third trimester Prophylaxis in labor  Preterm labor symptoms and general obstetric precautions including but not limited to vaginal bleeding, contractions, leaking of fluid and fetal movement were reviewed in detail with the patient. Please refer to After Visit Summary for other counseling recommendations.   Return in about 2 weeks (around 05/31/2021) for in person, Virtual, ROB, Low risk.  Future Appointments  Date Time Provider Department Center  05/17/2021 10:30 AM Idell Hissong, Gigi Gin, MD [facility] None  06/01/2021  7:15 AM [facility] NURSE [facility]  06/01/2021  7:30 AM [facility] US3 [facility]    Catalina Antigua, MD

## 2021-05-17 NOTE — Progress Notes (Signed)
ROB/GTT.  Declined TDAP vaccine. PHQ-9=0

## 2021-05-18 ENCOUNTER — Other Ambulatory Visit: Payer: Self-pay | Admitting: Obstetrics and Gynecology

## 2021-05-18 LAB — CBC
Hematocrit: 32 % — ABNORMAL LOW (ref 34.0–46.6)
Hemoglobin: 10.2 g/dL — ABNORMAL LOW (ref 11.1–15.9)
MCH: 22.8 pg — ABNORMAL LOW (ref 26.6–33.0)
MCHC: 31.9 g/dL (ref 31.5–35.7)
MCV: 71 fL — ABNORMAL LOW (ref 79–97)
Platelets: 200 10*3/uL (ref 150–450)
RBC: 4.48 x10E6/uL (ref 3.77–5.28)
RDW: 13 % (ref 11.7–15.4)
WBC: 8.6 10*3/uL (ref 3.4–10.8)

## 2021-05-18 LAB — HIV ANTIBODY (ROUTINE TESTING W REFLEX): HIV Screen 4th Generation wRfx: NONREACTIVE

## 2021-05-18 LAB — RPR: RPR Ser Ql: NONREACTIVE

## 2021-05-18 LAB — GLUCOSE TOLERANCE, 2 HOURS W/ 1HR
Glucose, 1 hour: 176 mg/dL (ref 65–179)
Glucose, 2 hour: 151 mg/dL (ref 65–152)
Glucose, Fasting: 78 mg/dL (ref 65–91)

## 2021-05-18 MED ORDER — FERROUS SULFATE 325 (65 FE) MG PO TABS: 325.0000 mg | ORAL_TABLET | Freq: Two times a day (BID) | ORAL | 1 refills | Status: DC

## 2021-05-28 ENCOUNTER — Other Ambulatory Visit: Payer: Self-pay

## 2021-05-28 ENCOUNTER — Encounter (HOSPITAL_COMMUNITY): Payer: Self-pay | Admitting: Family Medicine

## 2021-05-28 ENCOUNTER — Inpatient Hospital Stay (HOSPITAL_COMMUNITY)
Admission: AD | Admit: 2021-05-28 | Discharge: 2021-05-28 | Disposition: A | Discharge status: Home / Self Care | Payer: Managed Care, Other (non HMO) | Attending: Family Medicine | Admitting: Family Medicine

## 2021-05-28 ENCOUNTER — Inpatient Hospital Stay (HOSPITAL_BASED_OUTPATIENT_CLINIC_OR_DEPARTMENT_OTHER): Payer: Managed Care, Other (non HMO)

## 2021-05-28 DIAGNOSIS — M79605 Pain in left leg: Secondary | ICD-10-CM | POA: Diagnosis present

## 2021-05-28 DIAGNOSIS — O2343 Unspecified infection of urinary tract in pregnancy, third trimester: Secondary | ICD-10-CM

## 2021-05-28 DIAGNOSIS — D563 Thalassemia minor: Secondary | ICD-10-CM

## 2021-05-28 DIAGNOSIS — Z3A31 31 weeks gestation of pregnancy: Secondary | ICD-10-CM | POA: Diagnosis not present

## 2021-05-28 DIAGNOSIS — O10913 Unspecified pre-existing hypertension complicating pregnancy, third trimester: Secondary | ICD-10-CM | POA: Insufficient documentation

## 2021-05-28 DIAGNOSIS — Z79899 Other long term (current) drug therapy: Secondary | ICD-10-CM | POA: Diagnosis not present

## 2021-05-28 DIAGNOSIS — O0990 Supervision of high risk pregnancy, unspecified, unspecified trimester: Secondary | ICD-10-CM

## 2021-05-28 DIAGNOSIS — O99891 Other specified diseases and conditions complicating pregnancy: Secondary | ICD-10-CM | POA: Diagnosis not present

## 2021-05-28 DIAGNOSIS — O212 Late vomiting of pregnancy: Secondary | ICD-10-CM | POA: Diagnosis not present

## 2021-05-28 DIAGNOSIS — M79662 Pain in left lower leg: Secondary | ICD-10-CM | POA: Insufficient documentation

## 2021-05-28 DIAGNOSIS — M7989 Other specified soft tissue disorders: Secondary | ICD-10-CM | POA: Diagnosis not present

## 2021-05-28 DIAGNOSIS — B951 Streptococcus, group B, as the cause of diseases classified elsewhere: Secondary | ICD-10-CM

## 2021-05-28 DIAGNOSIS — Z148 Genetic carrier of other disease: Secondary | ICD-10-CM

## 2021-05-28 DIAGNOSIS — R87612 Low grade squamous intraepithelial lesion on cytologic smear of cervix (LGSIL): Secondary | ICD-10-CM

## 2021-05-28 MED ORDER — CYCLOBENZAPRINE HCL 10 MG PO TABS: 10.0000 mg | ORAL_TABLET | Freq: Three times a day (TID) | ORAL | 0 refills | Status: DC | PRN

## 2021-05-28 MED ORDER — PROMETHAZINE HCL 12.5 MG PO TABS: 12.5000 mg | ORAL_TABLET | Freq: Four times a day (QID) | ORAL | 0 refills | Status: DC | PRN

## 2021-05-28 NOTE — Progress Notes (Signed)
LLE venous duplex has been completed.  Preliminary results given to Chrissie Noa, NP .

## 2021-05-28 NOTE — MAU Provider Note (Signed)
History     CSN: [id number]  Arrival date and time: 05/28/21 1517   Event Date/Time   First Provider Initiated Contact with Patient 05/28/21 1619      Chief Complaint  Patient presents with  . Leg Pain  . Decreased Fetal Movement   HPI Michelle Thompson 25 y.o. [redacted]w[redacted]d Comes in with pain in her left calf for 36 hours.  Has worked yesterday and today but had to leave work early today as she was having difficulty walking.   Has periodic nausea and vomiting.  Vomited this morning when she tried to take tylenol for the pain.   OB History    Gravida  3   Para  2   Term  2   Preterm      AB      Living  2     SAB      IAB      Ectopic      Multiple  0   Live Births  2           Past Medical History:  Diagnosis Date  . Hypertension    Gestational Hypertension    Past Surgical History:  Procedure Laterality Date  . NO PAST SURGERIES      Family History  Problem Relation Age of Onset  . Lupus Mother   . Healthy Father   . Asthma Brother   . Diabetes Maternal Grandmother   . COPD Maternal Grandmother   . Cancer Maternal Grandmother   . Diabetes Maternal Grandfather   . Stroke Maternal Grandfather     Social History   Tobacco Use  . Smoking status: Never Smoker  . Smokeless tobacco: Never Used  Vaping Use  . Vaping Use: Never used  Substance Use Topics  . Alcohol use: No  . Drug use: No    Allergies:  Allergies  Allergen Reactions  . Fentanyl Shortness Of Breath and Itching    Questionable SOB after IV Fentanyl    Medications Prior to Admission  Medication Sig Dispense Refill Last Dose  . famotidine (PEPCID) 20 MG tablet Take 1 tablet (20 mg total) by mouth 2 (two) times daily. (Patient not taking: Reported on 04/21/2021) 60 tablet 3   . ferrous sulfate (FERROUSUL) 325 (65 FE) MG tablet Take 1 tablet (325 mg total) by mouth 2 (two) times daily. 60 tablet 1   . labetalol (NORMODYNE) 100 MG tablet Take 1 tablet (100 mg total) by mouth 2  (two) times daily. 60 tablet 3   . NIFEdipine (PROCARDIA XL) 30 MG 24 hr tablet Take 1 tablet (30 mg total) by mouth daily. (Patient not taking: Reported on 04/21/2021) 30 tablet 3   . Prenatal Vit-Fe Fumarate-FA (PRENATAL MULTIVITAMIN) TABS tablet Take 1 tablet by mouth daily at 12 noon.     . promethazine (PHENERGAN) 12.5 MG tablet Take 1-2 tablets (12.5-25 mg total) by mouth at bedtime as needed for nausea or vomiting. (Patient not taking: Reported on 04/21/2021) 30 tablet 0     Review of Systems  Constitutional: Negative for fever.  Gastrointestinal: Positive for nausea and vomiting.  Musculoskeletal:       Pain in left calf   Physical Exam   Blood pressure 138/77, pulse (!) 106, temperature 99.5 F (37.5 C), temperature source Oral, resp. rate 16, last menstrual period 10/15/2020, not currently breastfeeding.  Physical Exam Vitals and nursing note reviewed.  Constitutional:      Appearance: She is well-developed.  HENT:  Head: Normocephalic.  Cardiovascular:     Rate and Rhythm: Normal rate and regular rhythm.  Pulmonary:     Effort: Pulmonary effort is normal.     Breath sounds: Normal breath sounds.  Abdominal:     Palpations: Abdomen is soft.     Tenderness: There is no abdominal tenderness. There is no guarding or rebound.  Musculoskeletal:        General: Swelling and tenderness present.     Cervical back: Neck supple.     Comments: Left leg tenderness in calf and visible swelling in left leg at level of calf.  Tenderness to the lightest touch.  No pain behind either knee.  Pulses normal in both feet.  Positive homan's sign in left leg.  Skin:    General: Skin is warm and dry.  Neurological:     Mental Status: She is alert and oriented to person, place, and time.   FHTstrip - baseline FHT 155 with accels of 10x10 noted  Occasional contraction, no decelerations.  Reassuring strip for gestational age.  Of note, client has not eaten anything today - had sips of  fluids.  MAU Course  Procedures Venous ultrasound is negative - will not need to treat for DVT.  MDM Symptoms indicate possible DVT in left leg.  Venous ultrasound here and will test for DVT. After venous study, client reports she has work 162 hours in the last 2 weeks.  Likely muscluoskeletall pain.  She drove to the hosptial today and wants to go home.  Assessment and Plan  Left leg pain  Plan Will prescribe flexeril and client will pick up at her pharmacy today. Advised alternating ice and heat to her leg. Monitory symptoms and return if symptoms worsen. Today, no DVT is identified. Keep work hours at 40 hours per week. Note for work given. Sent message to the office to have a nurse visit to check her leg pain this week.  Currie Paris 05/28/2021, 4:49 PM

## 2021-05-28 NOTE — MAU Note (Signed)
Pt is a G3P2 at 31.4 weeks reporting that since yesterday am she had a lump in her leg that has increased in pain since yesterday am, with pain with walking, visible tender lump on left calf.  Since the pain started she has noticed decrease fetal movement.   She also reports N&V that has been normal for her in pregnancy that is now increasing.    CHTN on labetalol, no HA, blurry vision, or RUQ pain.  Has not had baseline labs drawn since initiating labetalol.  COVID infection in December/January 2022.  Works as a Engineer, civil (consulting), wears compression socks irregularly.

## 2021-06-01 ENCOUNTER — Other Ambulatory Visit: Payer: Self-pay | Admitting: Maternal & Fetal Medicine

## 2021-06-01 ENCOUNTER — Ambulatory Visit: Payer: Managed Care, Other (non HMO) | Attending: Maternal & Fetal Medicine

## 2021-06-01 ENCOUNTER — Other Ambulatory Visit: Payer: Self-pay | Admitting: *Deleted

## 2021-06-01 ENCOUNTER — Encounter: Payer: Self-pay | Admitting: *Deleted

## 2021-06-01 ENCOUNTER — Other Ambulatory Visit: Payer: Self-pay

## 2021-06-01 ENCOUNTER — Ambulatory Visit: Payer: Managed Care, Other (non HMO) | Admitting: *Deleted

## 2021-06-01 DIAGNOSIS — O0990 Supervision of high risk pregnancy, unspecified, unspecified trimester: Secondary | ICD-10-CM

## 2021-06-01 DIAGNOSIS — O10919 Unspecified pre-existing hypertension complicating pregnancy, unspecified trimester: Secondary | ICD-10-CM

## 2021-06-01 DIAGNOSIS — R87612 Low grade squamous intraepithelial lesion on cytologic smear of cervix (LGSIL): Secondary | ICD-10-CM | POA: Diagnosis present

## 2021-06-01 DIAGNOSIS — O10912 Unspecified pre-existing hypertension complicating pregnancy, second trimester: Secondary | ICD-10-CM

## 2021-06-01 DIAGNOSIS — D563 Thalassemia minor: Secondary | ICD-10-CM | POA: Diagnosis present

## 2021-06-01 DIAGNOSIS — O10013 Pre-existing essential hypertension complicating pregnancy, third trimester: Secondary | ICD-10-CM

## 2021-06-01 DIAGNOSIS — Z148 Genetic carrier of other disease: Secondary | ICD-10-CM | POA: Diagnosis present

## 2021-06-01 DIAGNOSIS — Z3A32 32 weeks gestation of pregnancy: Secondary | ICD-10-CM | POA: Diagnosis not present

## 2021-06-01 DIAGNOSIS — Z363 Encounter for antenatal screening for malformations: Secondary | ICD-10-CM

## 2021-06-01 DIAGNOSIS — Z8616 Personal history of COVID-19: Secondary | ICD-10-CM

## 2021-06-08 ENCOUNTER — Ambulatory Visit: Payer: Managed Care, Other (non HMO)

## 2021-06-13 ENCOUNTER — Telehealth (INDEPENDENT_AMBULATORY_CARE_PROVIDER_SITE_OTHER): Payer: Managed Care, Other (non HMO) | Admitting: Obstetrics and Gynecology

## 2021-06-13 ENCOUNTER — Encounter: Payer: Self-pay | Admitting: Obstetrics and Gynecology

## 2021-06-13 VITALS — BP 125/80 | HR 101

## 2021-06-13 DIAGNOSIS — O99891 Other specified diseases and conditions complicating pregnancy: Secondary | ICD-10-CM

## 2021-06-13 DIAGNOSIS — O2343 Unspecified infection of urinary tract in pregnancy, third trimester: Secondary | ICD-10-CM

## 2021-06-13 DIAGNOSIS — O99013 Anemia complicating pregnancy, third trimester: Secondary | ICD-10-CM

## 2021-06-13 DIAGNOSIS — O10919 Unspecified pre-existing hypertension complicating pregnancy, unspecified trimester: Secondary | ICD-10-CM

## 2021-06-13 DIAGNOSIS — O10913 Unspecified pre-existing hypertension complicating pregnancy, third trimester: Secondary | ICD-10-CM

## 2021-06-13 DIAGNOSIS — O0990 Supervision of high risk pregnancy, unspecified, unspecified trimester: Secondary | ICD-10-CM

## 2021-06-13 DIAGNOSIS — O99820 Streptococcus B carrier state complicating pregnancy: Secondary | ICD-10-CM

## 2021-06-13 DIAGNOSIS — Z148 Genetic carrier of other disease: Secondary | ICD-10-CM

## 2021-06-13 DIAGNOSIS — D563 Thalassemia minor: Secondary | ICD-10-CM

## 2021-06-13 DIAGNOSIS — B951 Streptococcus, group B, as the cause of diseases classified elsewhere: Secondary | ICD-10-CM

## 2021-06-13 DIAGNOSIS — Z3A33 33 weeks gestation of pregnancy: Secondary | ICD-10-CM

## 2021-06-13 DIAGNOSIS — R87612 Low grade squamous intraepithelial lesion on cytologic smear of cervix (LGSIL): Secondary | ICD-10-CM

## 2021-06-13 DIAGNOSIS — O2340 Unspecified infection of urinary tract in pregnancy, unspecified trimester: Secondary | ICD-10-CM

## 2021-06-13 NOTE — Progress Notes (Signed)
OBSTETRICS PRENATAL VIRTUAL VISIT ENCOUNTER NOTE  Provider location: [facility]   Patient location: Home  I connected with Michelle Thompson on 06/13/21 at 10:45 AM EDT by MyChart Video Encounter and verified that I am speaking with the correct person using two identifiers. I discussed the limitations, risks, security and privacy concerns of performing an evaluation and management service virtually and the availability of in person appointments. I also discussed with the patient that there may be a patient responsible charge related to this service. The patient expressed understanding and agreed to proceed. Subjective:  Michelle Thompson is a 25 y.o. G3P2002 at [redacted]w[redacted]d being seen today for ongoing prenatal care.  She is currently monitored for the following issues for this high-risk pregnancy and has Chronic hypertension affecting pregnancy; Supervision of high risk pregnancy, antepartum; History of gestational hypertension; GBS (group B streptococcus) UTI complicating pregnancy; LGSIL on Pap smear of cervix; Alpha thalassemia silent carrier; and Genetic carrier on their problem list.  Patient reports no complaints.  Contractions: Not present. Vag. Bleeding: None.  Movement: Present. Denies any leaking of fluid.   The following portions of the patient's history were reviewed and updated as appropriate: allergies, current medications, past family history, past medical history, past social history, past surgical history and problem list.   Objective:   Vitals:   06/13/21 1028  BP: 125/80  Pulse: (!) 101    Fetal Status:     Movement: Present     General:  Alert, oriented and cooperative. Patient is in no acute distress.  Respiratory: Normal respiratory effort, no problems with respiration noted  Mental Status: Normal mood and affect. Normal behavior. Normal judgment and thought content.  Rest of physical exam deferred due to type of encounter  Imaging: [place] MFM  FETAL BPP WO NON STRESS  Result Date: 06/01/2021 ----------------------------------------------------------------------  OBSTETRICS REPORT                       (Signed Final 06/01/2021 10:29 am) ---------------------------------------------------------------------- Patient Info  ID #:       [id number]                          D.O.B.:  March 11, 1996 (25 yrs)  Name:       Michelle Thompson                 Visit Date: 06/01/2021 07:44 am ---------------------------------------------------------------------- Performed By  Attending:        Noralee Space MD        Ref. Address:     Faculty  Performed By:     Clayton Lefort RDMS       [facility]  Referred By:      Catalina Antigua MD ---------------------------------------------------------------------- Orders  #  Description                           Code        Ordered By  1  [place] MFM OB FOLLOW UP                   40981.19    Lin Landsman  2  [place] MFM FETAL BPP WO NON               76819.01    CORENTHIAN     STRESS                                            BOOKER ----------------------------------------------------------------------  #  Order #                     Accession #                Episode #  1  [id number]                   [id number]                 [id number]  2  [id number]                   [id number]                 [id number] ---------------------------------------------------------------------- Indications  [redacted] weeks gestation of pregnancy                Z3A.32  Hypertension - Chronic/Pre-existing            O10.019  (labetalol)  Encounter for antenatal screening for          Z36.3  malformations (LR NIPS, Neg AFP)  Genetic carrier (Alpha Thal, SMA)               Z14.8  Medical complication of pregnancy (Covid-      O26.90  19) ---------------------------------------------------------------------- Fetal Evaluation  Num Of Fetuses:         1  Fetal Heart Rate(bpm):  160  Cardiac Activity:       Observed  Presentation:           Cephalic  Placenta:               Posterior  P. Cord Insertion:      Previously Visualized  Amniotic Fluid  AFI  FV:      Within normal limits  AFI Sum(cm)     %Tile       Largest Pocket(cm)  17.35           64          5.54  RUQ(cm)       RLQ(cm)       LUQ(cm)        LLQ(cm)  2.01          5.03          4.77           5.54 ---------------------------------------------------------------------- Biophysical Evaluation  Amniotic F.V:   Within normal limits       F. Tone:        Observed  F. Movement:    Observed                   Score:          8/8  F. Breathing:   Observed ---------------------------------------------------------------------- Biometry  BPD:      80.2  mm     G. Age:  32w 1d         43  %    CI:        73.38   %    70 - 86                                                          FL/HC:      20.2   %    19.1 - 21.3  HC:      297.5  mm     G. Age:  32w 6d         33  %    HC/AC:      1.11        0.96 - 1.17  AC:      269.1  mm     G. Age:  31w 0d         18  %    FL/BPD:     74.9   %    71 - 87  FL:       60.1  mm     G. Age:  31w 2d         17  %    FL/AC:      22.3   %    20 - 24  LV:        3.5  mm  Est. FW:    1754  gm    3 lb 14 oz      18  % ---------------------------------------------------------------------- OB History  Gravidity:    3  Living:       2 ---------------------------------------------------------------------- Gestational Age  LMP:           34w 1d        Date:  10/05/20                 EDD:   07/12/21  Clinical EDD:  32w 5d                                        EDD:   07/22/21  U/S  Today:     31w 6d                                        EDD:   07/28/21  Best:          32w 1d     Det. By:  U/S C R L  (01/02/21)     EDD:   07/26/21 ---------------------------------------------------------------------- Anatomy  Cranium:               Previously seen        Aortic Arch:            Previously seen  Cavum:                 Previously seen        Ductal Arch:            Previously seen  Ventricles:            Appears normal         Diaphragm:              Previously seen  Choroid Plexus:        Previously seen        Stomach:                Appears normal, left                                                                        sided  Cerebellum:            Previously seen        Abdomen:                Previously seen  Posterior Fossa:       Previously seen        Abdominal Wall:         Previously seen  Nuchal Fold:           Previously seen        Cord Vessels:           Previously seen  Face:                  Orbits and profile     Kidneys:                Appear normal                         previously seen  Lips:                  Previously seen        Bladder:                Appears normal  Thoracic:              Previously seen        Spine:                  Previously seen  Heart:  Appears normal         Upper Extremities:      Previously seen                         (4CH, axis, and                         situs)  RVOT:                  Previously seen        Lower Extremities:      Previously seen  LVOT:                  Previously seen  Other:  Hands and feet prev visualized. Nasal bone prev visualized. Lenses          prev visualized. Fetus appears to be a female. 5th digits prev visualized. ---------------------------------------------------------------------- Cervix Uterus Adnexa  Cervix  Normal appearance by transabdominal scan.  Right Ovary  Within normal limits.  Left Ovary  Within normal limits. ---------------------------------------------------------------------- Impression  Chronic hypertension.  Patient takes labetalol.  She does not  have gestational diabetes.  Blood pressure today at her office  is 129/74 mmHg.  Fetal growth is appropriate for gestational age.  Amniotic fluid  is normal and good fetal activity seen.  Antenatal testing is  reassuring.  BPP 8/8.  We reassured the patient of the findings. ---------------------------------------------------------------------- Recommendations  -Continue weekly BPP till delivery. ----------------------------------------------------------------------                  Noralee Space, MD Electronically Signed Final Report   06/01/2021 10:29 am ----------------------------------------------------------------------  VAS [place] LOWER EXTREMITY VENOUS (DVT)  Result Date: 05/29/2021  Lower Venous DVT Study Patient Name:  ELISABET GUTZMER  Date of Exam:   05/28/2021 Medical Rec #: [id number]        Accession #:    [id number] Date of Birth: 1996/04/29        Patient Gender: F Patient Age:   72Y Exam Location:  [facility] Procedure:      VAS [place] LOWER EXTREMITY VENOUS (DVT) Referring Phys: [id number] TERRI L BURLESON --------------------------------------------------------------------------------  Indications: Pain, and Swelling.  Risk Factors: Pregnancy. Comparison Study: No previous exams. Performing Technologist: Ernestene Mention  Examination Guidelines: A complete evaluation includes B-mode imaging, spectral Doppler, color Doppler, and power Doppler as needed of all accessible portions of each vessel. Bilateral testing is considered an integral part of a complete examination. Limited examinations for reoccurring indications may be performed as noted. The reflux portion of the exam is performed with the patient in reverse Trendelenburg.  +-----+---------------+---------+-----------+----------+--------------+ RIGHTCompressibilityPhasicitySpontaneityPropertiesThrombus Aging +-----+---------------+---------+-----------+----------+--------------+ CFV  Full           Yes      Yes                                  +-----+---------------+---------+-----------+----------+--------------+   +---------+---------------+---------+-----------+----------+--------------+ LEFT     CompressibilityPhasicitySpontaneityPropertiesThrombus Aging +---------+---------------+---------+-----------+----------+--------------+ CFV      Full           Yes      Yes                                 +---------+---------------+---------+-----------+----------+--------------+ SFJ      Full                                                        +---------+---------------+---------+-----------+----------+--------------+  FV Prox  Full           Yes      Yes                                 +---------+---------------+---------+-----------+----------+--------------+ FV Mid   Full           Yes      Yes                                 +---------+---------------+---------+-----------+----------+--------------+ FV DistalFull           Yes      Yes                                 +---------+---------------+---------+-----------+----------+--------------+ PFV      Full                                                        +---------+---------------+---------+-----------+----------+--------------+ POP      Full           Yes      Yes                                 +---------+---------------+---------+-----------+----------+--------------+ PTV      Full                                                        +---------+---------------+---------+-----------+----------+--------------+ PERO     Full                                                        +---------+---------------+---------+-----------+----------+--------------+     Summary: RIGHT: - No evidence of common femoral vein obstruction.  LEFT: - There is no evidence of deep vein thrombosis in the lower extremity. - There is no evidence of superficial venous thrombosis.  - No cystic structure found in the popliteal fossa.  *See table(s) above  for measurements and observations. Electronically signed by Sherald Hess MD on 05/29/2021 at 9:43:58 AM.    Final    [place] MFM OB FOLLOW UP  Result Date: 06/01/2021 ----------------------------------------------------------------------  OBSTETRICS REPORT                       (Signed Final 06/01/2021 10:29 am) ---------------------------------------------------------------------- Patient Info  ID #:       [id number]                          D.O.B.:  1996-12-28 (25 yrs)  Name:       Michelle Thompson                 Visit Date: 06/01/2021 07:44 am ---------------------------------------------------------------------- Performed By  Attending:  Noralee Space MD        Ref. Address:     Faculty  Performed By:     Clayton Lefort RDMS       [facility]  Referred By:      Catalina Antigua MD ---------------------------------------------------------------------- Orders  #  Description                           Code        Ordered By  1  [place] MFM OB FOLLOW UP                   13244.01    Lin Landsman  2  [place] MFM FETAL BPP WO NON               76819.01    CORENTHIAN     STRESS                                            BOOKER ----------------------------------------------------------------------  #  Order #                     Accession #                Episode #  1  [id number]                   [id number]                 [id number]  2  [id number]                   [id number]                 [id number] ---------------------------------------------------------------------- Indications  [redacted] weeks gestation of pregnancy  Z3A.32  Hypertension - Chronic/Pre-existing            O10.019  (labetalol)  Encounter for  antenatal screening for          Z36.3  malformations (LR NIPS, Neg AFP)  Genetic carrier (Alpha Thal, SMA)              Z14.8  Medical complication of pregnancy (Covid-      O26.90  19) ---------------------------------------------------------------------- Fetal Evaluation  Num Of Fetuses:         1  Fetal Heart Rate(bpm):  160  Cardiac Activity:       Observed  Presentation:           Cephalic  Placenta:               Posterior  P. Cord Insertion:      Previously Visualized  Amniotic Fluid  AFI FV:      Within normal limits  AFI Sum(cm)     %Tile       Largest Pocket(cm)  17.35           64          5.54  RUQ(cm)       RLQ(cm)       LUQ(cm)        LLQ(cm)  2.01          5.03          4.77           5.54 ---------------------------------------------------------------------- Biophysical Evaluation  Amniotic F.V:   Within normal limits       F. Tone:        Observed  F. Movement:    Observed                   Score:          8/8  F. Breathing:   Observed ---------------------------------------------------------------------- Biometry  BPD:      80.2  mm     G. Age:  32w 1d         43  %    CI:        73.38   %    70 - 86                                                          FL/HC:      20.2   %    19.1 - 21.3  HC:      297.5  mm     G. Age:  32w 6d         33  %    HC/AC:      1.11        0.96 - 1.17  AC:      269.1  mm     G. Age:  31w 0d         18  %    FL/BPD:     74.9   %    71 - 87  FL:       60.1  mm     G. Age:  31w 2d         17  %    FL/AC:      22.3   %    20 -  24  LV:        3.5  mm  Est. FW:    1754  gm    3 lb 14 oz      18  % ---------------------------------------------------------------------- OB History  Gravidity:    3  Living:       2 ---------------------------------------------------------------------- Gestational Age  LMP:           34w 1d        Date:  10/05/20                 EDD:   07/12/21  Clinical EDD:  32w 5d                                        EDD:   07/22/21  U/S Today:     31w 6d                                         EDD:   07/28/21  Best:          32w 1d     Det. By:  U/S C R L  (01/02/21)    EDD:   07/26/21 ---------------------------------------------------------------------- Anatomy  Cranium:               Previously seen        Aortic Arch:            Previously seen  Cavum:                 Previously seen        Ductal Arch:            Previously seen  Ventricles:            Appears normal         Diaphragm:              Previously seen  Choroid Plexus:        Previously seen        Stomach:                Appears normal, left                                                                        sided  Cerebellum:            Previously seen        Abdomen:                Previously seen  Posterior Fossa:       Previously seen        Abdominal Wall:         Previously seen  Nuchal Fold:           Previously seen        Cord Vessels:           Previously seen  Face:                  Orbits and profile  Kidneys:                Appear normal                         previously seen  Lips:                  Previously seen        Bladder:                Appears normal  Thoracic:              Previously seen        Spine:                  Previously seen  Heart:                 Appears normal         Upper Extremities:      Previously seen                         (4CH, axis, and                         situs)  RVOT:                  Previously seen        Lower Extremities:      Previously seen  LVOT:                  Previously seen  Other:  Hands and feet prev visualized. Nasal bone prev visualized. Lenses          prev visualized. Fetus appears to be a female. 5th digits prev visualized. ---------------------------------------------------------------------- Cervix Uterus Adnexa  Cervix  Normal appearance by transabdominal scan.  Right Ovary  Within normal limits.  Left Ovary  Within normal limits. ---------------------------------------------------------------------- Impression  Chronic  hypertension.  Patient takes labetalol.  She does not  have gestational diabetes.  Blood pressure today at her office is 129/74 mmHg.  Fetal growth is appropriate for gestational age.  Amniotic fluid  is normal and good fetal activity seen.  Antenatal testing is  reassuring.  BPP 8/8.  We reassured the patient of the findings. ---------------------------------------------------------------------- Recommendations  -Continue weekly BPP till delivery. ----------------------------------------------------------------------                  Noralee Space, MD Electronically Signed Final Report   06/01/2021 10:29 am ----------------------------------------------------------------------   Assessment and Plan:  Pregnancy: P5T6144 at [redacted]w[redacted]d 1. Supervision of high risk pregnancy, antepartum Patient is doing well without complaints Patient desires virtual visit in 2 weeks- will plan for cultures at 37 weeks  2. Chronic hypertension affecting pregnancy Normotensive without complaints Continue ASA and labetalol  3. Group B Streptococcus urinary tract infection affecting pregnancy, antepartum Prophylaxis in labor    Preterm labor symptoms and general obstetric precautions including but not limited to vaginal bleeding, contractions, leaking of fluid and fetal movement were reviewed in detail with the patient. I discussed the assessment and treatment plan with the patient. The patient was provided an opportunity to ask questions and all were answered. The patient agreed with the plan and demonstrated an understanding of the instructions. The patient was advised to call back or seek an in-person office evaluation/go to [facility]U at [facility] for any urgent or concerning  symptoms. Please refer to After Visit Summary for other counseling recommendations.   I provided 15 minutes of face-to-face time during this encounter.  No follow-ups on file.  Future Appointments  Date Time Provider Department  Center  06/13/2021 10:45 AM Andriana Casa, Gigi Gin, MD [facility] None  06/15/2021 10:45 AM [facility] NURSE [facility]  06/15/2021 11:00 AM [facility] US1 [facility]  06/22/2021 11:00 AM [facility] NURSE [facility]  06/22/2021 11:15 AM [facility] US2 [facility]  06/29/2021  8:00 AM [facility] NURSE [facility]  06/29/2021  8:15 AM [facility] US2 [facility]    Catalina Antigua, MD [facility] [employer]

## 2021-06-15 ENCOUNTER — Other Ambulatory Visit: Payer: Self-pay

## 2021-06-15 ENCOUNTER — Ambulatory Visit: Payer: Managed Care, Other (non HMO) | Admitting: *Deleted

## 2021-06-15 ENCOUNTER — Other Ambulatory Visit: Payer: Self-pay | Admitting: *Deleted

## 2021-06-15 ENCOUNTER — Ambulatory Visit: Payer: Managed Care, Other (non HMO) | Attending: Obstetrics and Gynecology

## 2021-06-15 ENCOUNTER — Encounter: Payer: Self-pay | Admitting: *Deleted

## 2021-06-15 VITALS — BP 125/83 | HR 111

## 2021-06-15 DIAGNOSIS — Z3A34 34 weeks gestation of pregnancy: Secondary | ICD-10-CM

## 2021-06-15 DIAGNOSIS — Z148 Genetic carrier of other disease: Secondary | ICD-10-CM | POA: Insufficient documentation

## 2021-06-15 DIAGNOSIS — O0990 Supervision of high risk pregnancy, unspecified, unspecified trimester: Secondary | ICD-10-CM

## 2021-06-15 DIAGNOSIS — Z8616 Personal history of COVID-19: Secondary | ICD-10-CM

## 2021-06-15 DIAGNOSIS — R87612 Low grade squamous intraepithelial lesion on cytologic smear of cervix (LGSIL): Secondary | ICD-10-CM | POA: Insufficient documentation

## 2021-06-15 DIAGNOSIS — Z362 Encounter for other antenatal screening follow-up: Secondary | ICD-10-CM

## 2021-06-15 DIAGNOSIS — O10919 Unspecified pre-existing hypertension complicating pregnancy, unspecified trimester: Secondary | ICD-10-CM

## 2021-06-15 DIAGNOSIS — O10013 Pre-existing essential hypertension complicating pregnancy, third trimester: Secondary | ICD-10-CM | POA: Diagnosis not present

## 2021-06-15 DIAGNOSIS — D563 Thalassemia minor: Secondary | ICD-10-CM | POA: Diagnosis present

## 2021-06-17 ENCOUNTER — Other Ambulatory Visit: Payer: Self-pay

## 2021-06-17 ENCOUNTER — Encounter (HOSPITAL_COMMUNITY): Payer: Self-pay | Admitting: Obstetrics & Gynecology

## 2021-06-17 ENCOUNTER — Inpatient Hospital Stay (HOSPITAL_COMMUNITY)
Admission: AD | Admit: 2021-06-17 | Discharge: 2021-06-18 | Disposition: A | Discharge status: Home / Self Care | Payer: Managed Care, Other (non HMO) | Attending: Obstetrics & Gynecology | Admitting: Obstetrics & Gynecology

## 2021-06-17 DIAGNOSIS — O133 Gestational [pregnancy-induced] hypertension without significant proteinuria, third trimester: Secondary | ICD-10-CM | POA: Insufficient documentation

## 2021-06-17 DIAGNOSIS — O4703 False labor before 37 completed weeks of gestation, third trimester: Secondary | ICD-10-CM | POA: Diagnosis not present

## 2021-06-17 DIAGNOSIS — O479 False labor, unspecified: Secondary | ICD-10-CM

## 2021-06-17 DIAGNOSIS — Z79899 Other long term (current) drug therapy: Secondary | ICD-10-CM | POA: Insufficient documentation

## 2021-06-17 DIAGNOSIS — Z3A34 34 weeks gestation of pregnancy: Secondary | ICD-10-CM | POA: Insufficient documentation

## 2021-06-17 LAB — URINALYSIS, ROUTINE W REFLEX MICROSCOPIC
Bilirubin Urine: NEGATIVE
Glucose, UA: NEGATIVE mg/dL
Hgb urine dipstick: NEGATIVE
Ketones, ur: 5 mg/dL — AB
Leukocytes,Ua: NEGATIVE
Nitrite: NEGATIVE
Protein, ur: NEGATIVE mg/dL
Specific Gravity, Urine: 1.014 (ref 1.005–1.030)
pH: 6 (ref 5.0–8.0)

## 2021-06-17 MED ORDER — HYDROCODONE-ACETAMINOPHEN 5-325 MG PO TABS
2.0000 | ORAL_TABLET | Freq: Once | ORAL | Status: AC
Start: 2021-06-17 — End: 2021-06-17
  Administered 2021-06-17: 2 via ORAL
  Filled 2021-06-17: qty 2

## 2021-06-17 MED ORDER — NIFEDIPINE 10 MG PO CAPS
10.0000 mg | ORAL_CAPSULE | Freq: Once | ORAL | Status: AC
  Administered 2021-06-17: 10 mg via ORAL
  Filled 2021-06-17: qty 1

## 2021-06-17 NOTE — MAU Note (Signed)
Contractions since 5 pm that are 6 minutes apart. Lost mucous plug this morning, has had clear leaking of fluid since. Denies vaginal bleeding. +FM

## 2021-06-17 NOTE — MAU Provider Note (Signed)
History     CSN: [id number]  Arrival date and time: 06/17/21 2045   None     Chief Complaint  Patient presents with   Contractions   HPI  Ms.Michelle Thompson is a 25 y.o. female G65P2002 @ [redacted]w[redacted]d with a history of CHTN on labetalol, here in MAU with contractions. She feels them so frequently that she has stopped timing them. 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 She did leave work early today. She tried tylenol at 1630 which helped only some. + fetal movement. Noted some mucus earlier.   OB History     Gravida  3   Para  2   Term  2   Preterm      AB      Living  2      SAB      IAB      Ectopic      Multiple  0   Live Births  2           Past Medical History:  Diagnosis Date   Hypertension    Gestational Hypertension    Past Surgical History:  Procedure Laterality Date   NO PAST SURGERIES      Family History  Problem Relation Age of Onset   Lupus Mother    Healthy Father    Asthma Brother    Diabetes Maternal Grandmother    COPD Maternal Grandmother    Cancer Maternal Grandmother    Diabetes Maternal Grandfather    Stroke Maternal Grandfather     Social History   Tobacco Use   Smoking status: Never   Smokeless tobacco: Never  Vaping Use   Vaping Use: Never used  Substance Use Topics   Alcohol use: No   Drug use: No    Allergies:  Allergies  Allergen Reactions   Fentanyl Shortness Of Breath and Itching    Questionable SOB after IV Fentanyl    Medications Prior to Admission  Medication Sig Dispense Refill Last Dose   cyclobenzaprine (FLEXERIL) 10 MG tablet Take 1 tablet (10 mg total) by mouth 3 (three) times daily as needed for muscle spasms. Can take 1/2 or 1 tablet as directed. 30 tablet 0 Past Week   ferrous sulfate (FERROUSUL) 325 (65 FE) MG tablet Take 1 tablet (325 mg total) by mouth 2 (two) times daily. 60 tablet 1 06/17/2021   labetalol (NORMODYNE) 100 MG tablet Take 1  tablet (100 mg total) by mouth 2 (two) times daily. 60 tablet 3 06/17/2021 at 0800   Prenatal Vit-Fe Fumarate-FA (PRENATAL MULTIVITAMIN) TABS tablet Take 1 tablet by mouth daily at 12 noon.   Past Week   cyclobenzaprine (FLEXERIL) 10 MG tablet Take 1 tablet (10 mg total) by mouth 3 (three) times daily as needed for muscle spasms. Can take 1/2 or 1 tablet as directed. 30 tablet 0    famotidine (PEPCID) 20 MG tablet Take 1 tablet (20 mg total) by mouth 2 (two) times daily. (Patient not taking: No sig reported) 60 tablet 3 More than a month   promethazine (PHENERGAN) 12.5 MG tablet Take 1 tablet (12.5 mg total) by mouth every 6 (six) hours as needed for nausea or vomiting. 30 tablet 0 More than a month   promethazine (PHENERGAN) 12.5 MG tablet Take 1 tablet (12.5 mg total) by mouth every 6 (six) hours as needed for nausea or vomiting. 30 tablet 0 More than a month   Results  for orders placed or performed during the hospital encounter of 06/17/21 (from the past 72 hour(s))  Urinalysis, Routine w reflex microscopic Urine, Clean Catch     Status: Abnormal   Collection Time: 06/17/21  9:22 PM  Result Value Ref Range   Color, Urine YELLOW YELLOW   APPearance CLEAR CLEAR   Specific Gravity, Urine 1.014 1.005 - 1.030   pH 6.0 5.0 - 8.0   Glucose, UA NEGATIVE NEGATIVE mg/dL   Hgb urine dipstick NEGATIVE NEGATIVE   Bilirubin Urine NEGATIVE NEGATIVE   Ketones, ur 5 (A) NEGATIVE mg/dL   Protein, ur NEGATIVE NEGATIVE mg/dL   Nitrite NEGATIVE NEGATIVE   Leukocytes,Ua NEGATIVE NEGATIVE    Comment: Performed at [facility], [address] [place], [place] [address]    Review of Systems  Gastrointestinal:  Positive for abdominal pain.  Genitourinary:  Negative for vaginal bleeding and vaginal discharge.  Physical Exam   Blood pressure 133/86, pulse (!) 107, temperature 97.9 F (36.6 C), temperature source Oral, resp. rate 18, height 5\' 2"  (1.575 m), weight 77.4 kg, last menstrual period 10/15/2020,  SpO2 100 %, not currently breastfeeding.  Patient Vitals for the past 24 hrs:  BP Temp Temp src Pulse Resp SpO2 Height Weight  06/18/21 0019 (!) 141/85 98.1 F (36.7 C) Oral (!) 101 18 -- -- --  06/18/21 0016 -- -- -- -- -- 98 % -- --  06/17/21 2207 118/65 -- -- -- -- -- -- --  06/17/21 2059 133/86 97.9 F (36.6 C) Oral (!) 107 18 100 % 5\' 2"  (1.575 m) 77.4 kg     Physical Exam Constitutional:      General: She is not in acute distress.    Appearance: Normal appearance. She is not toxic-appearing.  HENT:     Head: Normocephalic.  Abdominal:     Palpations: Abdomen is soft.  Genitourinary:    Comments: Dilation: 1 Effacement (%): 60 Station: -2 Presentation: Vertex Exam by:: , RNC-OB  Musculoskeletal:        General: Normal range of motion.  Skin:    General: Skin is warm.  Neurological:     Mental Status: She is alert and oriented to person, place, and time.  Psychiatric:        Mood and Affect: Mood normal.   Fetal Tracing: Baseline: 145 bpm Variability: Moderate  Accelerations: 15x15 Decelerations: None Toco: Q3-4 mins, with irregular pattern   MAU Course  Procedures None  MDM  Cervix unchanged from previous exam Procardia 10 mg PO & Vicodin 2 tablets given. Patient feels relief, pain not completely gone, however she does feel relief.  Dr. [phone] reviewed fetal tracing; reactive NST as stated above   Assessment and Plan   A:  1. Braxton Hick's contraction   2. [redacted] weeks gestation of pregnancy     P:  Discharge home in stable condition Return to MAU if symptoms worsen Work note provided for patient Pelvic rest Preterm labor precautions  Of note, NP was not notified of elevated DC BP reading of 141/85  Shirlette Scarber, Julio Alm I, NP 06/18/2021 12:28 AM

## 2021-06-18 DIAGNOSIS — O4703 False labor before 37 completed weeks of gestation, third trimester: Secondary | ICD-10-CM | POA: Diagnosis not present

## 2021-06-20 ENCOUNTER — Other Ambulatory Visit: Payer: Self-pay | Admitting: *Deleted

## 2021-06-20 ENCOUNTER — Encounter: Payer: Self-pay | Admitting: *Deleted

## 2021-06-20 ENCOUNTER — Ambulatory Visit: Payer: Managed Care, Other (non HMO) | Attending: Obstetrics and Gynecology

## 2021-06-20 ENCOUNTER — Ambulatory Visit: Payer: Managed Care, Other (non HMO) | Admitting: *Deleted

## 2021-06-20 ENCOUNTER — Other Ambulatory Visit: Payer: Self-pay

## 2021-06-20 VITALS — BP 125/84 | HR 108

## 2021-06-20 DIAGNOSIS — O10013 Pre-existing essential hypertension complicating pregnancy, third trimester: Secondary | ICD-10-CM | POA: Diagnosis not present

## 2021-06-20 DIAGNOSIS — O10913 Unspecified pre-existing hypertension complicating pregnancy, third trimester: Secondary | ICD-10-CM

## 2021-06-20 DIAGNOSIS — R87612 Low grade squamous intraepithelial lesion on cytologic smear of cervix (LGSIL): Secondary | ICD-10-CM | POA: Diagnosis present

## 2021-06-20 DIAGNOSIS — B951 Streptococcus, group B, as the cause of diseases classified elsewhere: Secondary | ICD-10-CM | POA: Insufficient documentation

## 2021-06-20 DIAGNOSIS — O2343 Unspecified infection of urinary tract in pregnancy, third trimester: Secondary | ICD-10-CM | POA: Insufficient documentation

## 2021-06-20 DIAGNOSIS — Z148 Genetic carrier of other disease: Secondary | ICD-10-CM | POA: Insufficient documentation

## 2021-06-20 DIAGNOSIS — D563 Thalassemia minor: Secondary | ICD-10-CM | POA: Diagnosis present

## 2021-06-20 DIAGNOSIS — Z362 Encounter for other antenatal screening follow-up: Secondary | ICD-10-CM

## 2021-06-20 DIAGNOSIS — Z3A34 34 weeks gestation of pregnancy: Secondary | ICD-10-CM | POA: Diagnosis not present

## 2021-06-20 DIAGNOSIS — O0990 Supervision of high risk pregnancy, unspecified, unspecified trimester: Secondary | ICD-10-CM | POA: Diagnosis present

## 2021-06-20 DIAGNOSIS — O10919 Unspecified pre-existing hypertension complicating pregnancy, unspecified trimester: Secondary | ICD-10-CM | POA: Diagnosis not present

## 2021-06-20 DIAGNOSIS — Z8616 Personal history of COVID-19: Secondary | ICD-10-CM

## 2021-06-21 ENCOUNTER — Inpatient Hospital Stay (HOSPITAL_COMMUNITY)
Admission: AD | Admit: 2021-06-21 | Discharge: 2021-06-22 | Disposition: A | Discharge status: Home / Self Care | Payer: Managed Care, Other (non HMO) | Attending: Family Medicine | Admitting: Family Medicine

## 2021-06-21 DIAGNOSIS — Z885 Allergy status to narcotic agent status: Secondary | ICD-10-CM | POA: Insufficient documentation

## 2021-06-21 DIAGNOSIS — O4703 False labor before 37 completed weeks of gestation, third trimester: Secondary | ICD-10-CM | POA: Insufficient documentation

## 2021-06-21 DIAGNOSIS — R110 Nausea: Secondary | ICD-10-CM | POA: Insufficient documentation

## 2021-06-21 DIAGNOSIS — Z79899 Other long term (current) drug therapy: Secondary | ICD-10-CM | POA: Insufficient documentation

## 2021-06-21 DIAGNOSIS — Z3A35 35 weeks gestation of pregnancy: Secondary | ICD-10-CM | POA: Insufficient documentation

## 2021-06-21 DIAGNOSIS — O4700 False labor before 37 completed weeks of gestation, unspecified trimester: Secondary | ICD-10-CM

## 2021-06-21 DIAGNOSIS — O26893 Other specified pregnancy related conditions, third trimester: Secondary | ICD-10-CM | POA: Insufficient documentation

## 2021-06-21 DIAGNOSIS — R109 Unspecified abdominal pain: Secondary | ICD-10-CM

## 2021-06-22 ENCOUNTER — Ambulatory Visit: Payer: Managed Care, Other (non HMO)

## 2021-06-22 ENCOUNTER — Encounter (HOSPITAL_COMMUNITY): Payer: Self-pay | Admitting: Family Medicine

## 2021-06-22 ENCOUNTER — Other Ambulatory Visit: Payer: Self-pay

## 2021-06-22 DIAGNOSIS — Z3A35 35 weeks gestation of pregnancy: Secondary | ICD-10-CM | POA: Diagnosis not present

## 2021-06-22 DIAGNOSIS — Z79899 Other long term (current) drug therapy: Secondary | ICD-10-CM | POA: Diagnosis not present

## 2021-06-22 DIAGNOSIS — R110 Nausea: Secondary | ICD-10-CM | POA: Diagnosis not present

## 2021-06-22 DIAGNOSIS — O212 Late vomiting of pregnancy: Secondary | ICD-10-CM

## 2021-06-22 DIAGNOSIS — O26893 Other specified pregnancy related conditions, third trimester: Secondary | ICD-10-CM | POA: Diagnosis not present

## 2021-06-22 DIAGNOSIS — O4703 False labor before 37 completed weeks of gestation, third trimester: Secondary | ICD-10-CM | POA: Diagnosis not present

## 2021-06-22 DIAGNOSIS — Z885 Allergy status to narcotic agent status: Secondary | ICD-10-CM | POA: Diagnosis not present

## 2021-06-22 LAB — URINALYSIS, ROUTINE W REFLEX MICROSCOPIC
Bacteria, UA: NONE SEEN
Bilirubin Urine: NEGATIVE
Glucose, UA: NEGATIVE mg/dL
Hgb urine dipstick: NEGATIVE
Ketones, ur: NEGATIVE mg/dL
Nitrite: NEGATIVE
Protein, ur: 30 mg/dL — AB
Specific Gravity, Urine: 1.017 (ref 1.005–1.030)
pH: 6 (ref 5.0–8.0)

## 2021-06-22 MED ORDER — NIFEDIPINE 10 MG PO CAPS
10.0000 mg | ORAL_CAPSULE | ORAL | Status: DC | PRN
  Administered 2021-06-22: 10 mg via ORAL
  Filled 2021-06-22: qty 1

## 2021-06-22 MED ORDER — HYOSCYAMINE SULFATE SL 0.125 MG SL SUBL: 1.0000 | SUBLINGUAL_TABLET | Freq: Two times a day (BID) | SUBLINGUAL | 0 refills | Status: DC | PRN

## 2021-06-22 MED ORDER — SODIUM CHLORIDE 0.9 % IV SOLN
12.5000 mg | Freq: Once | INTRAVENOUS | Status: AC
  Administered 2021-06-22: 12.5 mg via INTRAVENOUS
  Filled 2021-06-22: qty 0.5

## 2021-06-22 MED ORDER — PROMETHAZINE HCL 25 MG PO TABS: 25.0000 mg | ORAL_TABLET | Freq: Four times a day (QID) | ORAL | 2 refills | Status: DC | PRN

## 2021-06-22 MED ORDER — LACTATED RINGERS IV SOLN: Freq: Once | INTRAVENOUS | Status: AC

## 2021-06-22 MED ORDER — HYOSCYAMINE SULFATE 0.125 MG SL SUBL
0.1250 mg | SUBLINGUAL_TABLET | Freq: Once | SUBLINGUAL | Status: AC
  Administered 2021-06-22: 0.125 mg via SUBLINGUAL
  Filled 2021-06-22: qty 1

## 2021-06-22 NOTE — MAU Note (Signed)
..  Michelle Thompson is a 24 y.o. at [redacted]w[redacted]d here in MAU reporting: contractions and pressure. Denies vaginal bleeding or leaking of fluid. +FM

## 2021-06-22 NOTE — MAU Provider Note (Signed)
Chief Complaint:  Contractions   Event Date/Time   First Provider Initiated Contact with Patient 06/22/21 0042     HPI: Michelle Thompson is a 25 y.o. G3P2002 at 67w1dwho presents to maternity admissions reporting painful contractions.  Feels a lot of pelvic pressure. Has been seen for same recently.  Got some relief but states continued to contract "but I just stayed home". . She reports good fetal movement, denies LOF, vaginal bleeding, vaginal itching/burning, urinary symptoms, h/a, dizziness, n/v, diarrhea, constipation or fever/chills.  Abdominal Pain This is a recurrent problem. The onset quality is gradual. The problem occurs intermittently. The quality of the pain is cramping. The abdominal pain does not radiate. Associated symptoms include nausea. Pertinent negatives include no constipation, diarrhea, dysuria, fever, headaches or myalgias. Nothing aggravates the pain. The pain is relieved by Nothing. She has tried nothing for the symptoms.      RN Note: Michelle Thompson is a 26 y.o. at [redacted]w[redacted]d here in MAU reporting: contractions and pressure. Denies vaginal bleeding or leaking of fluid. +FM  Past Medical History: Past Medical History:  Diagnosis Date   Hypertension    Gestational Hypertension    Past obstetric history: OB History  Gravida Para Term Preterm AB Living  3 2 2     2   SAB IAB Ectopic Multiple Live Births        0 2    # Outcome Date GA Lbr Len/2nd Weight Sex Delivery Anes PTL Lv  3 Current           2 Term 07/20/20 [redacted]w[redacted]d 11:50 / 00:07 2906 g M Vag-Spont EPI  LIV     Birth Comments:  wnl  1 Term 01/14/16    M Vag-Spont   LIV    Past Surgical History: Past Surgical History:  Procedure Laterality Date   NO PAST SURGERIES      Family History: Family History  Problem Relation Age of Onset   Lupus Mother    Healthy Father    Asthma Brother    Diabetes Maternal Grandmother    COPD Maternal Grandmother    Cancer Maternal Grandmother    Diabetes Maternal  Grandfather    Stroke Maternal Grandfather     Social History: Social History   Tobacco Use   Smoking status: Never   Smokeless tobacco: Never  Vaping Use   Vaping Use: Never used  Substance Use Topics   Alcohol use: No   Drug use: No    Allergies:  Allergies  Allergen Reactions   Fentanyl Shortness Of Breath and Itching    Questionable SOB after IV Fentanyl    Meds:  Medications Prior to Admission  Medication Sig Dispense Refill Last Dose   cyclobenzaprine (FLEXERIL) 10 MG tablet Take 1 tablet (10 mg total) by mouth 3 (three) times daily as needed for muscle spasms. Can take 1/2 or 1 tablet as directed. 30 tablet 0    ferrous sulfate (FERROUSUL) 325 (65 FE) MG tablet Take 1 tablet (325 mg total) by mouth 2 (two) times daily. 60 tablet 1    labetalol (NORMODYNE) 100 MG tablet Take 1 tablet (100 mg total) by mouth 2 (two) times daily. 60 tablet 3    Prenatal Vit-Fe Fumarate-FA (PRENATAL MULTIVITAMIN) TABS tablet Take 1 tablet by mouth daily at 12 noon.       I have reviewed patient's Past Medical Hx, Surgical Hx, Family Hx, Social Hx, medications and allergies.   ROS:  Review of Systems  Constitutional:  Negative for  fever.  Gastrointestinal:  Positive for abdominal pain and nausea. Negative for constipation and diarrhea.  Genitourinary:  Negative for dysuria.  Musculoskeletal:  Negative for myalgias.  Neurological:  Negative for headaches.  Other systems negative  Physical Exam  Patient Vitals for the past 24 hrs:  BP Temp Temp src Pulse Resp SpO2  06/22/21 0035 -- -- -- -- -- 96 %  06/22/21 0032 137/85 97.8 F (36.6 C) Oral 99 17 --   Constitutional: Well-developed, well-nourished female in no acute distress.  Cardiovascular: normal rate and rhythm Respiratory: normal effort GI: Abd soft, non-tender, gravid appropriate for gestational age.   No rebound or guarding. MS: Extremities nontender, no edema, normal ROM Neurologic: Alert and oriented x 4.  GU: Neg  CVAT.  PELVIC EXAM: Dilation: 1.5 Effacement (%): 50 Station: Ballotable Exam by:: Artelia Laroche, CNM  FHT:  Baseline 140 , moderate variability, accelerations present, no decelerations Contractions: Irregular     Labs: Results for orders placed or performed during the hospital encounter of 06/21/21 (from the past 24 hour(s))  Urinalysis, Routine w reflex microscopic Urine, Clean Catch     Status: Abnormal   Collection Time: 06/22/21 12:45 AM  Result Value Ref Range   Color, Urine YELLOW YELLOW   APPearance HAZY (A) CLEAR   Specific Gravity, Urine 1.017 1.005 - 1.030   pH 6.0 5.0 - 8.0   Glucose, UA NEGATIVE NEGATIVE mg/dL   Hgb urine dipstick NEGATIVE NEGATIVE   Bilirubin Urine NEGATIVE NEGATIVE   Ketones, ur NEGATIVE NEGATIVE mg/dL   Protein, ur 30 (A) NEGATIVE mg/dL   Nitrite NEGATIVE NEGATIVE   Leukocytes,Ua TRACE (A) NEGATIVE   RBC / HPF 0-5 0 - 5 RBC/hpf   WBC, UA 0-5 0 - 5 WBC/hpf   Bacteria, UA NONE SEEN NONE SEEN   Squamous Epithelial / LPF 6-10 0 - 5   Mucus PRESENT     O/Positive/-- (01/10 1359)  Imaging:    MAU Course/MDM: I have ordered labs and reviewed results. UA is clear NST reviewed, reactive  Treatments in MAU included LR infusion, Procardia x 1, phenergan, and Levsin.  Contractions almost totally stopped afterward and patient expressed relief Will send her home with Rx for Phenergan..    Assessment: Single IUP at [redacted]w[redacted]d Preterm uterine contractions with no cervical change Nausea and vomiting  Plan: Discharge home Rx Phenergan prn nausea Preterm Labor precautions and fetal kick counts Follow up in Office for prenatal visits Encouraged to return if she develops worsening of symptoms, increase in pain, fever, or other concerning symptoms.   Pt stable at time of discharge.  Wynelle Bourgeois CNM, MSN Certified Nurse-Midwife 06/22/2021 12:43 AM

## 2021-06-24 ENCOUNTER — Encounter (HOSPITAL_COMMUNITY): Payer: Self-pay | Admitting: Obstetrics & Gynecology

## 2021-06-24 ENCOUNTER — Inpatient Hospital Stay (HOSPITAL_COMMUNITY)
Admission: AD | Admit: 2021-06-24 | Discharge: 2021-06-24 | Disposition: A | Discharge status: Home / Self Care | Payer: Managed Care, Other (non HMO) | Attending: Obstetrics & Gynecology | Admitting: Obstetrics & Gynecology

## 2021-06-24 DIAGNOSIS — O26893 Other specified pregnancy related conditions, third trimester: Secondary | ICD-10-CM | POA: Diagnosis not present

## 2021-06-24 DIAGNOSIS — Z3A35 35 weeks gestation of pregnancy: Secondary | ICD-10-CM | POA: Diagnosis not present

## 2021-06-24 DIAGNOSIS — R109 Unspecified abdominal pain: Secondary | ICD-10-CM | POA: Diagnosis not present

## 2021-06-24 DIAGNOSIS — O4703 False labor before 37 completed weeks of gestation, third trimester: Secondary | ICD-10-CM | POA: Diagnosis present

## 2021-06-24 DIAGNOSIS — Z885 Allergy status to narcotic agent status: Secondary | ICD-10-CM | POA: Insufficient documentation

## 2021-06-24 DIAGNOSIS — Z79899 Other long term (current) drug therapy: Secondary | ICD-10-CM | POA: Insufficient documentation

## 2021-06-24 DIAGNOSIS — O479 False labor, unspecified: Secondary | ICD-10-CM

## 2021-06-24 NOTE — Discharge Instructions (Signed)

## 2021-06-24 NOTE — MAU Note (Signed)
Pt reports to mau with c/o bloody mucous and lower abd cramping.  Denies LOF. +FM

## 2021-06-24 NOTE — MAU Provider Note (Signed)
History     CSN: [id number]  Arrival date and time: 06/24/21 1600   Event Date/Time   First Provider Initiated Contact with Patient 06/24/21 1611      Chief Complaint  Patient presents with   Abdominal Pain   Vaginal Discharge   HPI Michelle Thompson is a 25 y.o. G3P2002 at [redacted]w[redacted]d who presents with vaginal bleeding. She states she was at work and saw bleeding in her underwear. She states it is not as heavy as a period but more than spotting. She also reports "really bad cramps." She is unsure how often they are coming but states they are worse than she has felt before. She was seen in MAU last week and was 1.5cm at that time.   OB History     Gravida  3   Para  2   Term  2   Preterm      AB      Living  2      SAB      IAB      Ectopic      Multiple  0   Live Births  2           Past Medical History:  Diagnosis Date   Hypertension    Gestational Hypertension    Past Surgical History:  Procedure Laterality Date   NO PAST SURGERIES      Family History  Problem Relation Age of Onset   Lupus Mother    Healthy Father    Asthma Brother    Diabetes Maternal Grandmother    COPD Maternal Grandmother    Cancer Maternal Grandmother    Diabetes Maternal Grandfather    Stroke Maternal Grandfather     Social History   Tobacco Use   Smoking status: Never   Smokeless tobacco: Never  Vaping Use   Vaping Use: Never used  Substance Use Topics   Alcohol use: No   Drug use: No    Allergies:  Allergies  Allergen Reactions   Fentanyl Shortness Of Breath and Itching    Questionable SOB after IV Fentanyl    Medications Prior to Admission  Medication Sig Dispense Refill Last Dose   cyclobenzaprine (FLEXERIL) 10 MG tablet Take 1 tablet (10 mg total) by mouth 3 (three) times daily as needed for muscle spasms. Can take 1/2 or 1 tablet as directed. 30 tablet 0    ferrous sulfate (FERROUSUL) 325 (65 FE) MG tablet Take 1 tablet (325 mg total) by mouth 2  (two) times daily. 60 tablet 1    labetalol (NORMODYNE) 100 MG tablet Take 1 tablet (100 mg total) by mouth 2 (two) times daily. 60 tablet 3    Prenatal Vit-Fe Fumarate-FA (PRENATAL MULTIVITAMIN) TABS tablet Take 1 tablet by mouth daily at 12 noon.      promethazine (PHENERGAN) 25 MG tablet Take 1 tablet (25 mg total) by mouth every 6 (six) hours as needed for nausea or vomiting. 30 tablet 2     Review of Systems  Constitutional: Negative.  Negative for fatigue and fever.  HENT: Negative.    Respiratory: Negative.  Negative for shortness of breath.   Cardiovascular: Negative.  Negative for chest pain.  Gastrointestinal:  Positive for abdominal pain. Negative for constipation, diarrhea, nausea and vomiting.  Genitourinary:  Positive for vaginal bleeding. Negative for dysuria and vaginal discharge.  Neurological: Negative.  Negative for dizziness and headaches.  Physical Exam   Last menstrual period 10/15/2020, not currently breastfeeding.  Physical Exam  Vitals and nursing note reviewed.  Constitutional:      General: She is not in acute distress.    Appearance: She is well-developed.  HENT:     Head: Normocephalic.  Eyes:     Pupils: Pupils are equal, round, and reactive to light.  Cardiovascular:     Rate and Rhythm: Normal rate and regular rhythm.     Heart sounds: Normal heart sounds.  Pulmonary:     Effort: Pulmonary effort is normal. No respiratory distress.     Breath sounds: Normal breath sounds.  Abdominal:     General: Bowel sounds are normal. There is no distension.     Palpations: Abdomen is soft.     Tenderness: There is no abdominal tenderness.  Genitourinary:    Comments: No bleeding on exam Skin:    General: Skin is warm and dry.  Neurological:     Mental Status: She is alert and oriented to person, place, and time.  Psychiatric:        Mood and Affect: Mood normal.        Behavior: Behavior normal.        Thought Content: Thought content normal.         Judgment: Judgment normal.   Fetal Tracing:  Baseline: 135 Variability: moderate Accels: 15x15 Decels: none  Toco: occasional uc's  Dilation: 1.5 Exam by:: Cleone Slim, CNM   MAU Course  Procedures  MDM Cervix unchanged after 1.5 hours and patient resting comfortably. No bleeding in MAU.   Labor precautions reviewed at length.   Assessment and Plan   1. False labor   2. [redacted] weeks gestation of pregnancy    -Discharge home in stable condition -Labor precautions discussed -Patient advised to follow-up with OB as scheduled on Tuesday for prenatal care -Patient may return to MAU as needed or if her condition were to change or worsen   Rolm Bookbinder CNM 06/24/2021, 4:11 PM

## 2021-06-27 ENCOUNTER — Telehealth (INDEPENDENT_AMBULATORY_CARE_PROVIDER_SITE_OTHER): Payer: Managed Care, Other (non HMO) | Admitting: Family Medicine

## 2021-06-27 VITALS — BP 128/87

## 2021-06-27 DIAGNOSIS — O0990 Supervision of high risk pregnancy, unspecified, unspecified trimester: Secondary | ICD-10-CM

## 2021-06-27 DIAGNOSIS — D563 Thalassemia minor: Secondary | ICD-10-CM

## 2021-06-27 DIAGNOSIS — R87612 Low grade squamous intraepithelial lesion on cytologic smear of cervix (LGSIL): Secondary | ICD-10-CM

## 2021-06-27 DIAGNOSIS — Z3A35 35 weeks gestation of pregnancy: Secondary | ICD-10-CM

## 2021-06-27 DIAGNOSIS — O2340 Unspecified infection of urinary tract in pregnancy, unspecified trimester: Secondary | ICD-10-CM

## 2021-06-27 DIAGNOSIS — B951 Streptococcus, group B, as the cause of diseases classified elsewhere: Secondary | ICD-10-CM

## 2021-06-27 DIAGNOSIS — O2343 Unspecified infection of urinary tract in pregnancy, third trimester: Secondary | ICD-10-CM

## 2021-06-27 DIAGNOSIS — O99013 Anemia complicating pregnancy, third trimester: Secondary | ICD-10-CM

## 2021-06-27 DIAGNOSIS — Z148 Genetic carrier of other disease: Secondary | ICD-10-CM

## 2021-06-27 DIAGNOSIS — O10913 Unspecified pre-existing hypertension complicating pregnancy, third trimester: Secondary | ICD-10-CM

## 2021-06-27 DIAGNOSIS — O99820 Streptococcus B carrier state complicating pregnancy: Secondary | ICD-10-CM

## 2021-06-27 DIAGNOSIS — O10919 Unspecified pre-existing hypertension complicating pregnancy, unspecified trimester: Secondary | ICD-10-CM

## 2021-06-27 DIAGNOSIS — O99891 Other specified diseases and conditions complicating pregnancy: Secondary | ICD-10-CM

## 2021-06-27 NOTE — Progress Notes (Signed)
OBSTETRICS PRENATAL VIRTUAL VISIT ENCOUNTER NOTE  Provider location: [facility] ([facility]   Patient location: Home  I connected with Michelle Thompson on 06/27/21 at  9:15 AM EDT by MyChart Video Encounter and verified that I am speaking with the correct person using two identifiers. I discussed the limitations, risks, security and privacy concerns of performing an evaluation and management service virtually and the availability of in person appointments. I also discussed with the patient that there may be a patient responsible charge related to this service. The patient expressed understanding and agreed to proceed. Subjective:  Michelle Thompson is a 25 y.o. G3P2002 at [redacted]w[redacted]d being seen today for ongoing prenatal care.  She is currently monitored for the following issues for this low-risk pregnancy and has Chronic hypertension affecting pregnancy; Supervision of high risk pregnancy, antepartum; History of gestational hypertension; GBS (group B streptococcus) UTI complicating pregnancy; LGSIL on Pap smear of cervix; Alpha thalassemia silent carrier; and Genetic carrier on their problem list.  Patient reports fatigue.  Contractions: Not present. Vag. Bleeding: None.  Movement: Present. Denies any leaking of fluid.   The following portions of the patient's history were reviewed and updated as appropriate: allergies, current medications, past family history, past medical history, past social history, past surgical history and problem list.   Objective:   Vitals:   06/27/21 0856  BP: 128/87    Fetal Status:     Movement: Present     General:  Alert, oriented and cooperative. Patient is in no acute distress.  Respiratory: Normal respiratory effort, no problems with respiration noted  Mental Status: Normal mood and affect. Normal behavior. Normal judgment and thought content.  Rest of physical exam deferred due to type of encounter  Imaging: [place] MFM FETAL BPP WO NON  STRESS  Result Date: 06/20/2021 ----------------------------------------------------------------------  OBSTETRICS REPORT                       (Signed Final 06/20/2021 02:32 pm) ---------------------------------------------------------------------- Patient Info  ID #:       [id number]                          D.O.B.:  [birth year] (25 yrs)  Name:       Michelle Thompson                 Visit Date: 06/20/2021 02:19 pm ---------------------------------------------------------------------- Performed By  Attending:        Ma Rings MD         Ref. Address:     Faculty  Performed By:     Eden Lathe BS      [facility]                    RD[facility] RVT                                 [facility]  Referred By:      Catalina Antigua MD ---------------------------------------------------------------------- Orders  #  Description                           Code        Ordered By  1  [place] MFM FETAL BPP WO NON               16109.60    RAVI [facility]     STRESS ----------------------------------------------------------------------  #  Order #                     Accession #                Episode #  1  [id number]                   [id number]                 [id number] ---------------------------------------------------------------------- Indications  Hypertension - Chronic/Pre-existing            O10.019  (labetalol)  [redacted] weeks gestation of pregnancy                Z3A.34  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 Cord Insertion:      Previously Visualized  Amniotic Fluid  AFI FV:      Within normal limits  AFI Sum(cm)     %Tile       Largest Pocket(cm)  21.8            82          8.2  RUQ(cm)       RLQ(cm)       LUQ(cm)        LLQ(cm)  8.2           4.4           7.3            1.9 ---------------------------------------------------------------------- Biophysical Evaluation  Amniotic F.V:   Within normal limits       F. Tone:        Observed  F. Movement:    Observed                   Score:          8/8  F. Breathing:   Observed ---------------------------------------------------------------------- OB History  Gravidity:    3  Living:       2 ---------------------------------------------------------------------- Gestational Age  LMP:           36w 6d        Date:  10/05/20  EDD:   07/12/21  Clinical EDD:  35w 3d                                        EDD:   07/22/21  Best:          34w 6d     Det. By:  U/S C R L  (01/02/21)    EDD:   07/26/21 ---------------------------------------------------------------------- Comments  This patient was seen for a biophysical profile due to chronic  hypertension treated with labetalol.  She denies any problems  since her last exam.  A biophysical profile performed today was 8 out of 8.  There was normal amniotic fluid noted on today's ultrasound  exam.  She will return in 1 week for another biophysical profile. ----------------------------------------------------------------------                   Ma RingsVictor Fang, MD Electronically Signed Final Report   06/20/2021 02:32 pm ----------------------------------------------------------------------  US MFM FETAL BPP WO NON STRESS  Result Date: 06/15/2021 ----------------------------------------------------------------------  OBSTETRICS REPORT                       (Signed Final 06/15/2021 11:58 am)  ---------------------------------------------------------------------- Patient Info  ID #:       [id number]                          D.O.B.:  1996/10/19 (25 yrs)  Name:       Michelle PenOURTNEY Overbey                 Visit Date: 06/15/2021 11:14 am ---------------------------------------------------------------------- Performed By  Attending:        Ma RingsVictor Fang MD         Ref. Address:     Faculty  Performed By:     Tommie RaymondMesha Tester BS,       Location[facility]                    RDMS, RVT                                [facility]  Referred By:      Gigi GinPEGGY                    CONSTANT MD ---------------------------------------------------------------------- Orders  #  Description                           Code  Ordered By  1  [place] MFM FETAL BPP WO NON               [id number]    RAVI [facility]     STRESS ----------------------------------------------------------------------  #  Order #                     Accession #                Episode #  1  [id number]                   [id number]                 [id number] ---------------------------------------------------------------------- Indications  Hypertension - Chronic/Pre-existing            O10.019  (labetalol)  [redacted] weeks gestation of pregnancy                Z3A.34  Genetic carrier Scientist, research (medical), SMA)              Z14.8  Medical complication of pregnancy (Covid-      O26.90  19)  LR NIPS/ Negative AFP  Encounter for other antenatal screening        Z36.2  follow-up ---------------------------------------------------------------------- Fetal Evaluation  Num Of Fetuses:         1  Fetal Heart Rate(bpm):  157  Cardiac Activity:       Observed  Presentation:           Cephalic  Placenta:               Posterior Fundal  P. Cord Insertion:      Previously Visualized  Amniotic Fluid  AFI FV:      Within normal limits  AFI Sum(cm)      %Tile       Largest Pocket(cm)  24.4            93          6.3  RUQ(cm)       RLQ(cm)       LUQ(cm)        LLQ(cm)  6.1           6.3           5.9            6.1 ---------------------------------------------------------------------- Biophysical Evaluation  Amniotic F.V:   Pocket => 2 cm             F. Tone:        Observed  F. Movement:    Observed                   Score:          8/8  F. Breathing:   Observed ---------------------------------------------------------------------- OB History  Gravidity:    3  Living:       2 ---------------------------------------------------------------------- Gestational Age  LMP:           36w 1d        Date:  10/05/20                 EDD:   07/12/21  Clinical EDD:  34w 5d                                        EDD:   07/22/21  Best:  34w 1d     Det. By:  U/S C R L  (01/02/21)    EDD:   07/26/21 ---------------------------------------------------------------------- Anatomy  Ventricles:            Appears normal         Stomach:                Appears normal, left                                                                        sided  Heart:                 Appears normal         Kidneys:                Appear normal                         (4CH, axis, and                         situs)  Diaphragm:             Appears normal         Bladder:                Appears normal ---------------------------------------------------------------------- Cervix Uterus Adnexa  Cervix  Not visualized (advanced GA >24wks)  Uterus  No abnormality visualized.  Right Ovary  Not visualized.  Left Ovary  Not visualized.  Cul De Sac  No free fluid seen.  Adnexa  No adnexal mass visualized. ---------------------------------------------------------------------- Comments  This patient was seen for a biophysical profile due to chronic  hypertension treated with labetalol.  She denies any problems  since her last exam.  A biophysical profile performed today was 8 out of 8.  There was normal  amniotic fluid noted on today's ultrasound  exam.  She will return in 1 week for another biophysical profile. ----------------------------------------------------------------------                   Ma Rings, MD Electronically Signed Final Report   06/15/2021 11:58 am ----------------------------------------------------------------------  [place] MFM FETAL BPP WO NON STRESS  Result Date: 06/01/2021 ----------------------------------------------------------------------  OBSTETRICS REPORT                       (Signed Final 06/01/2021 10:29 am) ---------------------------------------------------------------------- Patient Info  ID #:       [id number]                          D.O.B.:  Jan 21, 1996 (25 yrs)  Name:       Michelle Thompson                 Visit Date: 06/01/2021 07:44 am ---------------------------------------------------------------------- Performed By  Attending:        Noralee Space MD        Ref. Address:     Faculty  Performed By:     Clayton Lefort RDMS       [facility]  Referred By:      Catalina Antigua MD ---------------------------------------------------------------------- Orders  #  Description                           Code        Ordered By  1  [place] MFM OB FOLLOW UP                   09811.91    Lin Landsman  2  [place] MFM FETAL BPP WO NON               76819.01    CORENT[facility]AN     STRESS                                            BOOKER ----------------------------------------------------------------------  #  Order #                     Accession #                Episode #  1  [id number]                   [id number]                 [id number]  2  [id number]                   [id number]                  [id number] ---------------------------------------------------------------------- Indications  [redacted] weeks gestation of pregnancy                Z3A.32  Hypertension - Chronic/Pre-existing            O10.019  (labetalol)  Encounter for antenatal screening for          Z36.3  malformations (LR NIPS, Neg AFP)  Genetic carrier (Alpha Thal, SMA)              Z14.8  Medical complication of pregnancy (Covid-      O26.90  19) ---------------------------------------------------------------------- Fetal Evaluation  Num Of Fetuses:  1  Fetal Heart Rate(bpm):  160  Cardiac Activity:       Observed  Presentation:           Cephalic  Placenta:               Posterior  P. Cord Insertion:      Previously Visualized  Amniotic Fluid  AFI FV:      Within normal limits  AFI Sum(cm)     %Tile       Largest Pocket(cm)  17.35           64          5.54  RUQ(cm)       RLQ(cm)       LUQ(cm)        LLQ(cm)  2.01          5.03          4.77           5.54 ---------------------------------------------------------------------- Biophysical Evaluation  Amniotic F.V:   Within normal limits       F. Tone:        Observed  F. Movement:    Observed                   Score:          8/8  F. Breathing:   Observed ---------------------------------------------------------------------- Biometry  BPD:      80.2  mm     G. Age:  32w 1d         43  %    CI:        73.38   %    70 - 86                                                          FL/HC:      20.2   %    19.1 - 21.3  HC:      297.5  mm     G. Age:  32w 6d         33  %    HC/AC:      1.11        0.96 - 1.17  AC:      269.1  mm     G. Age:  31w 0d         18  %    FL/BPD:     74.9   %    71 - 87  FL:       60.1  mm     G. Age:  31w 2d         17  %    FL/AC:      22.3   %    20 - 24  LV:        3.5  mm  Est. FW:    1754  gm    3 lb 14 oz      18  % ---------------------------------------------------------------------- OB History  Gravidity:    3  Living:       2  ---------------------------------------------------------------------- Gestational Age  LMP:           34w 1d        Date:  10/05/20  EDD:   07/12/21  Clinical EDD:  32w 5d                                        EDD:   07/22/21  U/S Today:     31w 6d                                        EDD:   07/28/21  Best:          32w 1d     Det. By:  U/S C R L  (01/02/21)    EDD:   07/26/21 ---------------------------------------------------------------------- Anatomy  Cranium:               Previously seen        Aortic Arch:            Previously seen  Cavum:                 Previously seen        Ductal Arch:            Previously seen  Ventricles:            Appears normal         Diaphragm:              Previously seen  Choroid Plexus:        Previously seen        Stomach:                Appears normal, left                                                                        sided  Cerebellum:            Previously seen        Abdomen:                Previously seen  Posterior Fossa:       Previously seen        Abdominal Wall:         Previously seen  Nuchal Fold:           Previously seen        Cord Vessels:           Previously seen  Face:                  Orbits and profile     Kidneys:                Appear normal                         previously seen  Lips:                  Previously seen        Bladder:                Appears normal  Thoracic:  Previously seen        Spine:                  Previously seen  Heart:                 Appears normal         Upper Extremities:      Previously seen                         (4CH, axis, and                         situs)  RVOT:                  Previously seen        Lower Extremities:      Previously seen  LVOT:                  Previously seen  Other:  Hands and feet prev visualized. Nasal bone prev visualized. Lenses          prev visualized. Fetus appears to be a female. 5th digits prev visualized.  ---------------------------------------------------------------------- Cervix Uterus Adnexa  Cervix  Normal appearance by transabdominal scan.  Right Ovary  Within normal limits.  Left Ovary  Within normal limits. ---------------------------------------------------------------------- Impression  Chronic hypertension.  Patient takes labetalol.  She does not  have gestational diabetes.  Blood pressure today at her office is 129/74 mmHg.  Fetal growth is appropriate for gestational age.  Amniotic fluid  is normal and good fetal activity seen.  Antenatal testing is  reassuring.  BPP 8/8.  We reassured the patient of the findings. ---------------------------------------------------------------------- Recommendations  -Continue weekly BPP till delivery. ----------------------------------------------------------------------                  Noralee Space, MD Electronically Signed Final Report   06/01/2021 10:29 am ----------------------------------------------------------------------  VAS [place] LOWER EXTREMITY VENOUS (DVT)  Result Date: 05/29/2021  Lower Venous DVT Study Patient Name:  SHARINA PETRE  Date of Exam:   05/28/2021 Medical Rec #: [id number]        Accession #:    [id number] Date of Birth: 18-Dec-1996        Patient Gender: F Patient Age:   7Y Exam Location:  [facility] Procedure:      VAS [place] LOWER EXTREMITY VENOUS (DVT) Referring Phys: [id number] TERRI L BURLESON --------------------------------------------------------------------------------  Indications: Pain, and Swelling.  Risk Factors: Pregnancy. Comparison Study: No previous exams. Performing Technologist: Ernestene Mention  Examination Guidelines: A complete evaluation includes B-mode imaging, spectral Doppler, color Doppler, and power Doppler as needed of all accessible portions of each vessel. Bilateral testing is considered an integral part of a complete examination. Limited examinations for reoccurring indications may be performed as noted. The reflux  portion of the exam is performed with the patient in reverse Trendelenburg.  +-----+---------------+---------+-----------+----------+--------------+ RIGHTCompressibilityPhasicitySpontaneityPropertiesThrombus Aging +-----+---------------+---------+-----------+----------+--------------+ CFV  Full           Yes      Yes                                 +-----+---------------+---------+-----------+----------+--------------+   +---------+---------------+---------+-----------+----------+--------------+ LEFT     CompressibilityPhasicitySpontaneityPropertiesThrombus Aging +---------+---------------+---------+-----------+----------+--------------+ CFV      Full           Yes      Yes                                 +---------+---------------+---------+-----------+----------+--------------+  SFJ      Full                                                        +---------+---------------+---------+-----------+----------+--------------+ FV Prox  Full           Yes      Yes                                 +---------+---------------+---------+-----------+----------+--------------+ FV Mid   Full           Yes      Yes                                 +---------+---------------+---------+-----------+----------+--------------+ FV DistalFull           Yes      Yes                                 +---------+---------------+---------+-----------+----------+--------------+ PFV      Full                                                        +---------+---------------+---------+-----------+----------+--------------+ POP      Full           Yes      Yes                                 +---------+---------------+---------+-----------+----------+--------------+ PTV      Full                                                        +---------+---------------+---------+-----------+----------+--------------+ PERO     Full                                                         +---------+---------------+---------+-----------+----------+--------------+     Summary: RIGHT: - No evidence of common femoral vein obstruction.  LEFT: - There is no evidence of deep vein thrombosis in the lower extremity. - There is no evidence of superficial venous thrombosis.  - No cystic structure found in the popliteal fossa.  *See table(s) above for measurements and observations. Electronically signed by Sherald Hess MD on 05/29/2021 at 9:43:58 AM.    Final    [place] MFM OB FOLLOW UP  Result Date: 06/01/2021 ----------------------------------------------------------------------  OBSTETRICS REPORT                       (Signed Final 06/01/2021 10:29 am) ---------------------------------------------------------------------- Patient Info  ID #:       [id number]  D.O.B.:  Feb 17, 1996 (25 yrs)  Name:       Michelle Thompson                 Visit Date: 06/01/2021 07:44 am ---------------------------------------------------------------------- Performed By  Attending:        Noralee Space MD        Ref. Address:     Faculty  Performed By:     Clayton Lefort RDMS       [facility]  Referred By:      Catalina Antigua MD ---------------------------------------------------------------------- Orders  #  Description                           Code        Ordered By  1  [place] MFM OB FOLLOW UP                   40981.19    Lin Landsman  2  [place] MFM FETAL BPP WO NON               14782.95    [facility]AN     STRESS                                            BOOKER ----------------------------------------------------------------------  #  Order #                     Accession #                 Episode #  1  [id number]                   [id number]                 [id number]  2  [id number]                   [id number]                 [id number] ---------------------------------------------------------------------- Indications  [redacted] weeks gestation of pregnancy                Z3A.32  Hypertension - Chronic/Pre-existing            O10.019  (labetalol)  Encounter for antenatal screening for          Z36.3  malformations (LR NIPS, Neg AFP)  Genetic carrier (Alpha Thal, SMA)              Z14.8  Medical complication of pregnancy (Covid-      O26.90  19) ---------------------------------------------------------------------- Fetal Evaluation  Num Of Fetuses:         1  Fetal Heart Rate(bpm):  160  Cardiac Activity:       Observed  Presentation:           Cephalic  Placenta:               Posterior  P. Cord Insertion:      Previously Visualized  Amniotic Fluid  AFI FV:      Within normal limits  AFI Sum(cm)     %Tile       Largest Pocket(cm)  17.35           64          5.54  RUQ(cm)       RLQ(cm)       LUQ(cm)        LLQ(cm)  2.01          5.03          4.77           5.54 ---------------------------------------------------------------------- Biophysical Evaluation  Amniotic F.V:   Within normal limits       F. Tone:        Observed  F. Movement:    Observed                   Score:          8/8  F. Breathing:   Observed ---------------------------------------------------------------------- Biometry  BPD:      80.2  mm     G. Age:  32w 1d         43  %    CI:        73.38   %    70 - 86                                                          FL/HC:      20.2   %    19.1 - 21.3  HC:      297.5  mm     G. Age:  32w 6d         33  %    HC/AC:      1.11        0.96 - 1.17  AC:      269.1  mm     G. Age:  31w 0d         18  %    FL/BPD:  74.9   %    71 - 87  FL:       60.1  mm     G. Age:  31w 2d         17  %    FL/AC:      22.3   %    20 - 24  LV:        3.5  mm  Est. FW:    1754  gm    3 lb 14 oz      18  %  ---------------------------------------------------------------------- OB History  Gravidity:    3  Living:       2 ---------------------------------------------------------------------- Gestational Age  LMP:           34w 1d        Date:  10/05/20                 EDD:   07/12/21  Clinical EDD:  32w 5d                                        EDD:   07/22/21  U/S Today:     31w 6d                                        EDD:   07/28/21  Best:          32w 1d     Det. By:  U/S C R L  (01/02/21)    EDD:   07/26/21 ---------------------------------------------------------------------- Anatomy  Cranium:               Previously seen        Aortic Arch:            Previously seen  Cavum:                 Previously seen        Ductal Arch:            Previously seen  Ventricles:            Appears normal         Diaphragm:              Previously seen  Choroid Plexus:        Previously seen        Stomach:                Appears normal, left                                                                        sided  Cerebellum:            Previously seen        Abdomen:                Previously seen  Posterior Fossa:       Previously seen        Abdominal Wall:         Previously seen  Nuchal Fold:           Previously seen        Cord Vessels:           Previously seen  Face:                  Orbits and profile     Kidneys:                Appear normal                         previously seen  Lips:                  Previously seen        Bladder:                Appears normal  Thoracic:              Previously seen        Spine:                  Previously seen  Heart:                 Appears normal         Upper Extremities:      Previously seen                         (4CH, axis, and                         situs)  RVOT:                  Previously seen        Lower Extremities:      Previously seen  LVOT:                  Previously seen  Other:  Hands and feet prev visualized. Nasal bone prev visualized. Lenses           prev visualized. Fetus appears to be a female. 5th digits prev visualized. ---------------------------------------------------------------------- Cervix Uterus Adnexa  Cervix  Normal appearance by transabdominal scan.  Right Ovary  Within normal limits.  Left Ovary  Within normal limits. ---------------------------------------------------------------------- Impression  Chronic hypertension.  Patient takes labetalol.  She does not  have gestational diabetes.  Blood pressure today at her office is 129/74 mmHg.  Fetal growth is appropriate for gestational age.  Amniotic fluid  is normal and good fetal activity seen.  Antenatal testing is  reassuring.  BPP 8/8.  We reassured the patient of the findings. ---------------------------------------------------------------------- Recommendations  -Continue weekly BPP till delivery. ----------------------------------------------------------------------                  Noralee Space, MD Electronically Signed Final Report   06/01/2021 10:29 am ----------------------------------------------------------------------   Assessment and Plan:  Pregnancy: [id number] at [redacted]w[redacted]d 1. Alpha thalassemia silent carrier   2. LGSIL on Pap smear of cervix Repeat pap in 1 year  3. Group B Streptococcus urinary tract infection affecting pregnancy, antepartum Will need treatment in labor  4. Supervision of high risk pregnancy, antepartum Continue routine prenatal care.  5. Genetic carrier SMA carrier  6. Chronic hypertension affecting pregnancy On labetalol and ASA BP is well controlled today In antenatal testing through MFM Last u/s for growth at 16%--f/u in 2 days  Preterm labor  symptoms and general obstetric precautions including but not limited to vaginal bleeding, contractions, leaking of fluid and fetal movement were reviewed in detail with the patient. I discussed the assessment and treatment plan with the patient. The patient was provided an opportunity to ask questions  and all were answered. The patient agreed with the plan and demonstrated an understanding of the instructions. The patient was advised to call back or seek an in-person office evaluation/go to MAU at [facility] for any urgent or concerning symptoms. Please refer to After Visit Summary for other counseling recommendations.   I provided 8 minutes of face-to-face time during this encounter.  Return in 1 week (on 07/04/2021) for virtual, desires Midwife, [facility].  Future Appointments  Date Time Provider Department Center  06/29/2021  8:00 AM [facility] NURSE [facility]  06/29/2021  8:15 AM [facility] US2 [facility]  07/04/2021  8:15 AM [facility] NST [facility]  07/11/2021  1:45 PM [facility] NURSE [facility]  07/11/2021  2:00 PM [facility] US1 [facility]  07/17/2021 10:15 AM [facility] NST [facility]    Reva Bores, MD Center for [employer], [facility]

## 2021-06-29 ENCOUNTER — Encounter: Payer: Self-pay | Admitting: *Deleted

## 2021-06-29 ENCOUNTER — Other Ambulatory Visit: Payer: Self-pay

## 2021-06-29 ENCOUNTER — Ambulatory Visit: Payer: Managed Care, Other (non HMO) | Attending: Obstetrics and Gynecology

## 2021-06-29 ENCOUNTER — Ambulatory Visit: Payer: Managed Care, Other (non HMO) | Admitting: *Deleted

## 2021-06-29 VITALS — BP 139/89 | HR 87

## 2021-06-29 DIAGNOSIS — O0990 Supervision of high risk pregnancy, unspecified, unspecified trimester: Secondary | ICD-10-CM

## 2021-06-29 DIAGNOSIS — Z362 Encounter for other antenatal screening follow-up: Secondary | ICD-10-CM | POA: Diagnosis not present

## 2021-06-29 DIAGNOSIS — D563 Thalassemia minor: Secondary | ICD-10-CM

## 2021-06-29 DIAGNOSIS — R87612 Low grade squamous intraepithelial lesion on cytologic smear of cervix (LGSIL): Secondary | ICD-10-CM

## 2021-06-29 DIAGNOSIS — O10913 Unspecified pre-existing hypertension complicating pregnancy, third trimester: Secondary | ICD-10-CM | POA: Insufficient documentation

## 2021-06-29 DIAGNOSIS — Z148 Genetic carrier of other disease: Secondary | ICD-10-CM | POA: Diagnosis not present

## 2021-06-29 DIAGNOSIS — O10013 Pre-existing essential hypertension complicating pregnancy, third trimester: Secondary | ICD-10-CM | POA: Diagnosis not present

## 2021-06-29 DIAGNOSIS — Z8616 Personal history of COVID-19: Secondary | ICD-10-CM

## 2021-06-29 DIAGNOSIS — O403XX0 Polyhydramnios, third trimester, not applicable or unspecified: Secondary | ICD-10-CM | POA: Diagnosis not present

## 2021-06-29 DIAGNOSIS — O10919 Unspecified pre-existing hypertension complicating pregnancy, unspecified trimester: Secondary | ICD-10-CM

## 2021-06-29 DIAGNOSIS — Z3A36 36 weeks gestation of pregnancy: Secondary | ICD-10-CM

## 2021-06-30 ENCOUNTER — Other Ambulatory Visit: Payer: Self-pay | Admitting: Obstetrics and Gynecology

## 2021-07-04 ENCOUNTER — Encounter (HOSPITAL_COMMUNITY): Payer: Self-pay | Admitting: Obstetrics and Gynecology

## 2021-07-04 ENCOUNTER — Ambulatory Visit (INDEPENDENT_AMBULATORY_CARE_PROVIDER_SITE_OTHER): Payer: Managed Care, Other (non HMO)

## 2021-07-04 ENCOUNTER — Observation Stay (HOSPITAL_COMMUNITY)
Admission: AD | Admit: 2021-07-04 | Discharge: 2021-07-04 | Disposition: A | Discharge status: Home / Self Care | Payer: Managed Care, Other (non HMO) | Attending: Obstetrics & Gynecology | Admitting: Obstetrics & Gynecology

## 2021-07-04 ENCOUNTER — Other Ambulatory Visit: Payer: Self-pay

## 2021-07-04 ENCOUNTER — Ambulatory Visit: Payer: Managed Care, Other (non HMO) | Admitting: *Deleted

## 2021-07-04 VITALS — BP 139/88 | HR 106 | Wt 169.2 lb

## 2021-07-04 DIAGNOSIS — O10919 Unspecified pre-existing hypertension complicating pregnancy, unspecified trimester: Secondary | ICD-10-CM

## 2021-07-04 DIAGNOSIS — Z20822 Contact with and (suspected) exposure to covid-19: Secondary | ICD-10-CM | POA: Diagnosis not present

## 2021-07-04 DIAGNOSIS — Z79899 Other long term (current) drug therapy: Secondary | ICD-10-CM | POA: Insufficient documentation

## 2021-07-04 DIAGNOSIS — O133 Gestational [pregnancy-induced] hypertension without significant proteinuria, third trimester: Secondary | ICD-10-CM | POA: Insufficient documentation

## 2021-07-04 DIAGNOSIS — O4703 False labor before 37 completed weeks of gestation, third trimester: Principal | ICD-10-CM | POA: Insufficient documentation

## 2021-07-04 DIAGNOSIS — O99820 Streptococcus B carrier state complicating pregnancy: Secondary | ICD-10-CM | POA: Insufficient documentation

## 2021-07-04 DIAGNOSIS — D563 Thalassemia minor: Secondary | ICD-10-CM | POA: Diagnosis present

## 2021-07-04 DIAGNOSIS — B951 Streptococcus, group B, as the cause of diseases classified elsewhere: Secondary | ICD-10-CM | POA: Diagnosis not present

## 2021-07-04 DIAGNOSIS — O2340 Unspecified infection of urinary tract in pregnancy, unspecified trimester: Secondary | ICD-10-CM | POA: Diagnosis present

## 2021-07-04 DIAGNOSIS — Z3A36 36 weeks gestation of pregnancy: Secondary | ICD-10-CM | POA: Insufficient documentation

## 2021-07-04 DIAGNOSIS — O0990 Supervision of high risk pregnancy, unspecified, unspecified trimester: Secondary | ICD-10-CM

## 2021-07-04 DIAGNOSIS — Z148 Genetic carrier of other disease: Secondary | ICD-10-CM

## 2021-07-04 DIAGNOSIS — R87612 Low grade squamous intraepithelial lesion on cytologic smear of cervix (LGSIL): Secondary | ICD-10-CM

## 2021-07-04 LAB — COMPREHENSIVE METABOLIC PANEL
ALT: 13 U/L (ref 0–44)
AST: 20 U/L (ref 15–41)
Albumin: 2.7 g/dL — ABNORMAL LOW (ref 3.5–5.0)
Alkaline Phosphatase: 145 U/L — ABNORMAL HIGH (ref 38–126)
Anion gap: 7 (ref 5–15)
BUN: <5 mg/dL — ABNORMAL LOW (ref 6–20)
CO2: 23 mmol/L (ref 22–32)
Calcium: 9.3 mg/dL (ref 8.9–10.3)
Chloride: 102 mmol/L (ref 98–111)
Creatinine, Ser: 0.73 mg/dL (ref 0.44–1.00)
GFR, Estimated: >60 mL/min (ref >60)
Glucose, Bld: 94 mg/dL (ref 70–99)
Potassium: 4 mmol/L (ref 3.5–5.1)
Sodium: 132 mmol/L — ABNORMAL LOW (ref 135–145)
Total Bilirubin: 0.6 mg/dL (ref 0.3–1.2)
Total Protein: 6 g/dL — ABNORMAL LOW (ref 6.5–8.1)

## 2021-07-04 LAB — URINALYSIS, ROUTINE W REFLEX MICROSCOPIC
Bilirubin Urine: NEGATIVE
Glucose, UA: NEGATIVE mg/dL
Hgb urine dipstick: NEGATIVE
Ketones, ur: 5 mg/dL — AB
Leukocytes,Ua: NEGATIVE
Nitrite: NEGATIVE
Protein, ur: NEGATIVE mg/dL
Specific Gravity, Urine: 1.017 (ref 1.005–1.030)
pH: 7 (ref 5.0–8.0)

## 2021-07-04 LAB — RESP PANEL BY RT-PCR (FLU A&B, COVID) ARPGX2
Influenza A by PCR: NEGATIVE
Influenza B by PCR: NEGATIVE
SARS Coronavirus 2 by RT PCR: NEGATIVE

## 2021-07-04 LAB — CBC WITH DIFFERENTIAL/PLATELET
Abs Immature Granulocytes: 0.04 10*3/uL (ref 0.00–0.07)
Basophils Absolute: 0 10*3/uL (ref 0.0–0.1)
Basophils Relative: 0 %
Eosinophils Absolute: 0.1 10*3/uL (ref 0.0–0.5)
Eosinophils Relative: 1 %
HCT: 33.6 % — ABNORMAL LOW (ref 36.0–46.0)
Hemoglobin: 10.6 g/dL — ABNORMAL LOW (ref 12.0–15.0)
Immature Granulocytes: 0 %
Lymphocytes Relative: 21 %
Lymphs Abs: 1.9 10*3/uL (ref 0.7–4.0)
MCH: 23 pg — ABNORMAL LOW (ref 26.0–34.0)
MCHC: 31.5 g/dL (ref 30.0–36.0)
MCV: 72.9 fL — ABNORMAL LOW (ref 80.0–100.0)
Monocytes Absolute: 0.8 10*3/uL (ref 0.1–1.0)
Monocytes Relative: 9 %
Neutro Abs: 6.5 10*3/uL (ref 1.7–7.7)
Neutrophils Relative %: 69 %
Platelets: 156 10*3/uL (ref 150–400)
RBC: 4.61 MIL/uL (ref 3.87–5.11)
RDW: 15.5 % (ref 11.5–15.5)
WBC: 9.4 10*3/uL (ref 4.0–10.5)
nRBC: 0 % (ref 0.0–0.2)

## 2021-07-04 LAB — PROTEIN / CREATININE RATIO, URINE
Creatinine, Urine: 128.16 mg/dL
Protein Creatinine Ratio: 0.19 mg/mg{Cre} — ABNORMAL HIGH (ref 0.00–0.15)
Total Protein, Urine: 24 mg/dL

## 2021-07-04 MED ORDER — OXYTOCIN BOLUS FROM INFUSION: 333.0000 mL | Freq: Once | INTRAVENOUS | Status: DC

## 2021-07-04 MED ORDER — LACTATED RINGERS IV SOLN: INTRAVENOUS | Status: DC

## 2021-07-04 MED ORDER — DIPHENHYDRAMINE HCL 50 MG/ML IJ SOLN
25.0000 mg | Freq: Once | INTRAMUSCULAR | Status: AC
  Administered 2021-07-04: 25 mg via INTRAVENOUS
  Filled 2021-07-04: qty 1

## 2021-07-04 MED ORDER — ONDANSETRON 4 MG PO TBDP
8.0000 mg | ORAL_TABLET | Freq: Once | ORAL | Status: AC
  Administered 2021-07-04: 8 mg via ORAL
  Filled 2021-07-04: qty 2

## 2021-07-04 MED ORDER — OXYCODONE-ACETAMINOPHEN 5-325 MG PO TABS: 1.0000 | ORAL_TABLET | ORAL | Status: DC | PRN

## 2021-07-04 MED ORDER — ONDANSETRON HCL 4 MG/2ML IJ SOLN: 4.0000 mg | Freq: Four times a day (QID) | INTRAMUSCULAR | Status: DC | PRN

## 2021-07-04 MED ORDER — OXYTOCIN-SODIUM CHLORIDE 30-0.9 UT/500ML-% IV SOLN: 2.5000 [IU]/h | INTRAVENOUS | Status: DC

## 2021-07-04 MED ORDER — OXYCODONE-ACETAMINOPHEN 5-325 MG PO TABS: 2.0000 | ORAL_TABLET | ORAL | Status: DC | PRN

## 2021-07-04 MED ORDER — HYDROXYZINE HCL 50 MG PO TABS: 50.0000 mg | ORAL_TABLET | Freq: Four times a day (QID) | ORAL | Status: DC | PRN

## 2021-07-04 MED ORDER — SOD CITRATE-CITRIC ACID 500-334 MG/5ML PO SOLN: 30.0000 mL | ORAL | Status: DC | PRN

## 2021-07-04 MED ORDER — PENICILLIN G POT IN DEXTROSE 60000 UNIT/ML IV SOLN: 3.0000 10*6.[IU] | INTRAVENOUS | Status: DC

## 2021-07-04 MED ORDER — NALBUPHINE HCL 10 MG/ML IJ SOLN
10.0000 mg | INTRAMUSCULAR | Status: DC | PRN
  Administered 2021-07-04: 10 mg via INTRAMUSCULAR
  Filled 2021-07-04: qty 1

## 2021-07-04 MED ORDER — LACTATED RINGERS IV SOLN: 500.0000 mL | INTRAVENOUS | Status: DC | PRN

## 2021-07-04 MED ORDER — ACETAMINOPHEN 500 MG PO TABS
1000.0000 mg | ORAL_TABLET | Freq: Once | ORAL | Status: AC
  Administered 2021-07-04: 1000 mg via ORAL
  Filled 2021-07-04: qty 2

## 2021-07-04 MED ORDER — ACETAMINOPHEN 325 MG PO TABS: 650.0000 mg | ORAL_TABLET | ORAL | Status: DC | PRN

## 2021-07-04 MED ORDER — LIDOCAINE HCL (PF) 1 % IJ SOLN: 30.0000 mL | INTRAMUSCULAR | Status: DC | PRN

## 2021-07-04 MED ORDER — SODIUM CHLORIDE 0.9 % IV SOLN
5.0000 10*6.[IU] | Freq: Once | INTRAVENOUS | Status: AC
  Administered 2021-07-04: 5 10*6.[IU] via INTRAVENOUS
  Filled 2021-07-04: qty 5

## 2021-07-04 MED ORDER — FLEET ENEMA 7-19 GM/118ML RE ENEM: 1.0000 | ENEMA | Freq: Every day | RECTAL | Status: DC | PRN

## 2021-07-04 MED ORDER — METOCLOPRAMIDE HCL 5 MG/ML IJ SOLN
10.0000 mg | Freq: Once | INTRAMUSCULAR | Status: AC
  Administered 2021-07-04: 10 mg via INTRAVENOUS
  Filled 2021-07-04: qty 2

## 2021-07-04 MED ORDER — LABETALOL HCL 100 MG PO TABS
100.0000 mg | ORAL_TABLET | Freq: Two times a day (BID) | ORAL | Status: DC
  Administered 2021-07-04: 100 mg via ORAL
  Filled 2021-07-04: qty 1

## 2021-07-04 MED ORDER — BUTORPHANOL TARTRATE 1 MG/ML IJ SOLN
1.0000 mg | INTRAMUSCULAR | Status: DC | PRN
  Administered 2021-07-04: 1 mg via INTRAVENOUS
  Filled 2021-07-04: qty 1

## 2021-07-04 NOTE — MAU Note (Signed)
Michelle Thompson is a 25 y.o. at [redacted]w[redacted]d here in MAU reporting: states she has not been able to keep anything down and that started this AM. Emesis x 4-5 today. Also is having contractions and feels like her BP is high. Contractions are about every 6 min. Yesterday had clear LOF. Denies bleeding. +FM.  Onset of complaint: today  Pain score: 8/10  Vitals:   07/04/21 1416  BP: (!) 135/93  Pulse: (!) 115  Resp: 16  Temp: 98.2 F (36.8 C)  SpO2: 97%     FHT:145  Lab orders placed from triage: UA

## 2021-07-04 NOTE — MAU Provider Note (Signed)
Error

## 2021-07-04 NOTE — Discharge Summary (Signed)
Antepartum  Discharge Summary       Patient Name: Michelle Thompson DOB: [birth year] MRN: [id number]  Date of admission: 07/04/2021  Date of discharge: 07/04/2021  Admitting diagnosis: Normal labor [O80, Z37.9] Intrauterine pregnancy: [redacted]w[redacted]d     Secondary diagnosis:  Principal Problem:   Preterm labor Active Problems:   Chronic hypertension affecting pregnancy   Supervision of high risk pregnancy, antepartum   GBS (group B streptococcus) UTI complicating pregnancy   Alpha thalassemia silent carrier   Genetic carrier   Normal labor    Hospital course: Patient presented to MAU for assessment of of contractions and headache. During workup in MAU, contractions became stronger and more frequent. Found to make cervical change. Admitted for early/latent preterm labor. Headache cocktail also given as tylenol has not improved headache. Patient was monitored for over 5 hours on L&D and did not make any cervical change. Her headache improved with headache cocktail. Her blood pressures remained mild range to normotensive. Her Pre E labs were normal, and she is at her baseline BP for her CHTN in pregnancy. Patient stable for discharge with close follow up. She was instructed to continue her PTA labetalol for her chronic HTN. She has follow up on 7/6 with Dr. Jolayne Panther.     Physical exam  Vitals:   07/04/21 1835 07/04/21 1939 07/04/21 2032 07/04/21 2137  BP: 133/84 136/85 (!) 149/89 132/81  Pulse: (!) 108 (!) 110 (!) 117 94  Resp: 18 18 18 18   Temp: 99 F (37.2 C)     TempSrc: Oral     SpO2:      Weight: 77.6 kg     Height: 5\' 2"  (1.575 m)      General: alert, cooperative, and no distress  Labs: Lab Results  Component Value Date   WBC 9.4 07/04/2021   HGB 10.6 (L) 07/04/2021   HCT 33.6 (L) 07/04/2021   MCV 72.9 (L) 07/04/2021   PLT 156 07/04/2021   CMP Latest Ref Rng & Units 07/04/2021  Glucose 70 - 99 mg/dL 94  BUN 6 - 20 mg/dL 09/04/2021)  Creatinine 09/04/2021 - 1.00 mg/dL <6(A  Sodium 6.30 -  1.60 mmol/L 132(L)  Potassium 3.5 - 5.1 mmol/L 4.0  Chloride 98 - 111 mmol/L 102  CO2 22 - 32 mmol/L 23  Calcium 8.9 - 10.3 mg/dL 9.3  Total Protein 6.5 - 8.1 g/dL 6.0(L)  Total Bilirubin 0.3 - 1.2 mg/dL 0.6  Alkaline Phos 38 - 126 U/L 145(H)  AST 15 - 41 U/L 20  ALT 0 - 44 U/L 13   Edinburgh Score: Edinburgh Postnatal Depression Scale Screening Tool 08/23/2020  I have been able to laugh and see the funny side of things. 0  I have looked forward with enjoyment to things. 0  I have blamed myself unnecessarily when things went wrong. 0  I have been anxious or worried for no good reason. 0  I have felt scared or panicky for no good reason. 0  Things have been getting on top of me. 0  I have been so unhappy that I have had difficulty sleeping. 0  I have felt sad or miserable. 0  I have been so unhappy that I have been crying. 0  The thought of harming myself has occurred to me. 0  Edinburgh Postnatal Depression Scale Total 0     After visit meds:  Allergies as of 07/04/2021       Reactions   Fentanyl Shortness Of Breath, Itching  Questionable SOB after IV Fentanyl        Medication List     TAKE these medications    cyclobenzaprine 10 MG tablet Commonly known as: FLEXERIL Take 1 tablet (10 mg total) by mouth 3 (three) times daily as needed for muscle spasms. Can take 1/2 or 1 tablet as directed.   ferrous sulfate 325 (65 FE) MG tablet Commonly known as: FerrouSul Take 1 tablet (325 mg total) by mouth 2 (two) times daily.   labetalol 100 MG tablet Commonly known as: NORMODYNE Take 1 tablet (100 mg total) by mouth 2 (two) times daily.   prenatal multivitamin Tabs tablet Take 1 tablet by mouth daily at 12 noon.   promethazine 25 MG tablet Commonly known as: PHENERGAN Take 1 tablet (25 mg total) by mouth every 6 (six) hours as needed for nausea or vomiting.         Future Appointments  Date Time Provider Department Center  07/05/2021  9:45 AM Constant, Gigi Gin, MD  [facility] None  07/11/2021  1:45 PM [facility] NURSE [facility]  07/11/2021  2:00 PM [facility] US1 [facility]  07/17/2021 10:15 AM [facility] NST [facility]      07/04/2021 Gita Kudo, MD

## 2021-07-04 NOTE — Progress Notes (Signed)
Pt informed that the ultrasound is considered a limited OB ultrasound and is not intended to be a complete ultrasound exam.  Patient also informed that the ultrasound is not being completed with the intent of assessing for fetal or placental anomalies or any pelvic abnormalities.  Explained that the purpose of today's ultrasound is to assess for presentation, BPP and amniotic fluid volume.  Patient acknowledges the purpose of the exam and the limitations of the study.    Pt states she has not taken Labetalol yet today. She denies H/A and visual disturbances.  Pt had [facility] appt tomorrow [facility]

## 2021-07-04 NOTE — H&P (Addendum)
Michelle Thompson is a 25 y.o. female presenting for CTX, HA, N/V, BP.  Patient felt nauseous all morning, had emesis x1 this AM at 9AM after coming home from her [place]. Has thrown up 3-4x since, food and bile. She also feels like she is contracting, maybe 6 min apart, but are coming and going, feels tolerable. Denies VB, denies LOF. +FM. She states she has normal BP at home of low 100s/70s, but today on admission and in office during [place] was 130s/80-90s. Admits to headache since emesis has occurred, but has not taken anything today.   During workup in MAU, contractions became stronger and more frequent. Found to make cervical change. Admitted for early/latent preterm labor. Headache cocktail also given as tylenol has not improved headache. All other preE labs normal, and she is at her baseline BP for her CHTN in pregnancy. She was also given food now that she can tolerate PO challenge.     OB History     Gravida  3   Para  2   Term  2   Preterm      AB      Living  2      SAB      IAB      Ectopic      Multiple  0   Live Births  2          Past Medical History:  Diagnosis Date   Hypertension    Gestational Hypertension   Past Surgical History:  Procedure Laterality Date   NO PAST SURGERIES     Family History: family history includes Asthma in her brother; COPD in her maternal grandmother; Cancer in her maternal grandmother; Diabetes in her maternal grandfather and maternal grandmother; Healthy in her father; Lupus in her mother; Stroke in her maternal grandfather. Social History:  reports that she has never smoked. She has never used smokeless tobacco. She reports that she does not drink alcohol and does not use drugs.     Maternal Diabetes: No Genetic Screening: Normal Maternal Ultrasounds/Referrals: Normal Fetal Ultrasounds or other Referrals:  None Maternal Substance Abuse:  No Significant Maternal Medications:  Meds include: Other:  Labetalol Significant  Maternal Lab Results:  Group B Strep positive and Other:  Alpha Thalassemia silent carrier, increased SMA Other Comments:  None  Review of Systems  Constitutional:  Negative for chills and fever.  Respiratory:  Negative for cough and shortness of breath.   Cardiovascular:  Negative for chest pain.  Gastrointestinal:  Positive for abdominal pain (contractions), nausea and vomiting. Negative for constipation and diarrhea.  Skin:  Negative for rash.  Neurological:  Positive for headaches.  Maternal Medical History:  Reason for admission: Nausea.    Dilation: 3 Effacement (%): 70 Station: -3 Exam by:: Dr. Omer Jack Blood pressure 136/85, pulse (!) 110, temperature 99 F (37.2 C), temperature source Oral, resp. rate 18, height 5\' 2"  (1.575 m), weight 77.6 kg, last menstrual period 10/15/2020, SpO2 99 %, not currently breastfeeding. Exam Physical Exam Constitutional:      General: She is not in acute distress.    Appearance: She is well-developed.  HENT:     Head: Normocephalic and atraumatic.  Cardiovascular:     Rate and Rhythm: Normal rate.  Pulmonary:     Effort: Pulmonary effort is normal. No respiratory distress.  Abdominal:     Comments: Gravid, palpating contractions  Musculoskeletal:        General: No tenderness.  Skin:    General: Skin  is warm and dry.     Findings: No erythema or rash.  Neurological:     Mental Status: She is alert and oriented to person, place, and time.    Prenatal labs: ABO, Rh: O/Positive/-- (01/10 1359) Antibody: Negative (01/10 1359) Rubella: 16.70 (01/10 1359) RPR: Non Reactive (05/18 1022)  HBsAg: Negative (01/10 1359)  HIV: Non Reactive (05/18 1022)  GBS: Positive/-- (07/09 1355)   Assessment/Plan: Michelle Thompson is a 25 y.o. G3P2002 at [redacted]w[redacted]d here for early preterm labor with known CHTN.    -FWB: Cat I  -chronic HTN- lab work negative for preeclampsia, suspect HA due to nausea/vomiting, pt given tylenol and noted  improvement Currently on Labetalol 100mg  bid -Early labor- cervical change noted, plan for continued observation, if further cervical change will admit, if no further cervical change may consider discharge    , DO 07/04/2021, 8:35 PM   Attestation of Attending Supervision of Obstetric Fellow: Evaluation and management procedures were performed by the Obstetric Fellow under my supervision and collaboration.  I have reviewed the Obstetric Fellow's note and chart, and I agree with the management and plan. I have also made any necessary editorial changes.   09/04/2021, DO Attending Obstetrician & Gynecologist, [facility] for [facility], [facility] 07/04/2021 10:19 PM

## 2021-07-05 ENCOUNTER — Encounter: Payer: Self-pay | Admitting: Obstetrics and Gynecology

## 2021-07-05 ENCOUNTER — Ambulatory Visit (INDEPENDENT_AMBULATORY_CARE_PROVIDER_SITE_OTHER): Payer: Managed Care, Other (non HMO) | Admitting: Obstetrics and Gynecology

## 2021-07-05 ENCOUNTER — Other Ambulatory Visit (HOSPITAL_COMMUNITY)
Admission: RE | Admit: 2021-07-05 | Discharge: 2021-07-05 | Disposition: A | Discharge status: Home / Self Care | Payer: Managed Care, Other (non HMO) | Source: Ambulatory Visit | Attending: Obstetrics and Gynecology | Admitting: Obstetrics and Gynecology

## 2021-07-05 VITALS — BP 135/85 | HR 108 | Wt 172.0 lb

## 2021-07-05 DIAGNOSIS — O0990 Supervision of high risk pregnancy, unspecified, unspecified trimester: Secondary | ICD-10-CM

## 2021-07-05 DIAGNOSIS — O10919 Unspecified pre-existing hypertension complicating pregnancy, unspecified trimester: Secondary | ICD-10-CM

## 2021-07-05 DIAGNOSIS — O2340 Unspecified infection of urinary tract in pregnancy, unspecified trimester: Secondary | ICD-10-CM

## 2021-07-05 DIAGNOSIS — B951 Streptococcus, group B, as the cause of diseases classified elsewhere: Secondary | ICD-10-CM

## 2021-07-05 LAB — RPR: RPR Ser Ql: NONREACTIVE

## 2021-07-05 NOTE — Progress Notes (Signed)
   PRENATAL VISIT NOTE  Subjective:  Michelle Thompson is a 25 y.o. G3P2002 at [redacted]w[redacted]d being seen today for ongoing prenatal care.  She is currently monitored for the following issues for this high-risk pregnancy and has Chronic hypertension affecting pregnancy; Supervision of high risk pregnancy, antepartum; History of gestational hypertension; GBS (group B streptococcus) UTI complicating pregnancy; LGSIL on Pap smear of cervix; Alpha thalassemia silent carrier; Genetic carrier; Normal labor; and Preterm labor on their problem list.  Patient reports ?LOF at 4 am and nothing since.  Contractions: Irregular. Vag. Bleeding: None.  Movement: Present. Denies leaking of fluid.   The following portions of the patient's history were reviewed and updated as appropriate: allergies, current medications, past family history, past medical history, past social history, past surgical history and problem list.   Objective:   Vitals:   07/05/21 0952  BP: 135/85  Pulse: (!) 108  Weight: 172 lb (78 kg)    Fetal Status: Fetal Heart Rate (bpm): 141 Fundal Height: 37 cm Movement: Present     General:  Alert, oriented and cooperative. Patient is in no acute distress.  Skin: Skin is warm and dry. No rash noted.   Cardiovascular: Normal heart rate noted  Respiratory: Normal respiratory effort, no problems with respiration noted  Abdomen: Soft, gravid, appropriate for gestational age.  Pain/Pressure: Absent     Pelvic: Cervical exam performed in the presence of a chaperone No pooling, or ferning        Extremities: Normal range of motion.     Mental Status: Normal mood and affect. Normal behavior. Normal judgment and thought content.   Assessment and Plan:  Pregnancy: G3P2002 at [redacted]w[redacted]d 1. Supervision of high risk pregnancy, antepartum Patient is doing well without complaints Reassurance provided as no LOF  2. Chronic hypertension affecting pregnancy Continue ASA and labetalol Discussed IOL at 38-39 weeks per  MFM- patient desires IOL at 38 weeks on 7/13  3. Group B Streptococcus urinary tract infection affecting pregnancy, antepartum Prophyalxis in labor  Term labor symptoms and general obstetric precautions including but not limited to vaginal bleeding, contractions, leaking of fluid and fetal movement were reviewed in detail with the patient. Please refer to After Visit Summary for other counseling recommendations.   Return in about 1 week (around 07/12/2021) for in person, ROB, High risk.  Future Appointments  Date Time Provider Department Center  07/11/2021  1:45 PM [facility] NURSE [facility]  07/11/2021  2:00 PM [facility] US1 [facility]  07/17/2021 10:15 AM [facility] NST [facility]    Catalina Antigua, MD

## 2021-07-05 NOTE — Progress Notes (Signed)
Pt reports fetal movement with irregular contractions.Pt states that she felt a gush of fluid this morning around 4am and wants to be checked to see if her water broke.

## 2021-07-06 ENCOUNTER — Telehealth (HOSPITAL_COMMUNITY): Payer: Self-pay | Admitting: *Deleted

## 2021-07-06 ENCOUNTER — Encounter (HOSPITAL_COMMUNITY): Payer: Self-pay | Admitting: *Deleted

## 2021-07-06 LAB — CERVICOVAGINAL ANCILLARY ONLY
Chlamydia: NEGATIVE
Comment: NEGATIVE
Comment: NORMAL
Neisseria Gonorrhea: NEGATIVE

## 2021-07-06 NOTE — Telephone Encounter (Signed)
Preadmission screen  

## 2021-07-09 ENCOUNTER — Other Ambulatory Visit: Payer: Self-pay | Admitting: Advanced Practice Midwife

## 2021-07-09 ENCOUNTER — Other Ambulatory Visit: Payer: Self-pay

## 2021-07-09 ENCOUNTER — Encounter (HOSPITAL_COMMUNITY): Payer: Self-pay | Admitting: Family Medicine

## 2021-07-09 ENCOUNTER — Inpatient Hospital Stay (HOSPITAL_COMMUNITY)
Admission: AD | Admit: 2021-07-09 | Discharge: 2021-07-11 | DRG: 806 | Disposition: A | Discharge status: Home / Self Care | Payer: Managed Care, Other (non HMO) | Attending: Obstetrics and Gynecology | Admitting: Obstetrics and Gynecology

## 2021-07-09 DIAGNOSIS — D563 Thalassemia minor: Secondary | ICD-10-CM | POA: Diagnosis present

## 2021-07-09 DIAGNOSIS — O1002 Pre-existing essential hypertension complicating childbirth: Principal | ICD-10-CM | POA: Diagnosis present

## 2021-07-09 DIAGNOSIS — O2340 Unspecified infection of urinary tract in pregnancy, unspecified trimester: Secondary | ICD-10-CM | POA: Diagnosis present

## 2021-07-09 DIAGNOSIS — O99013 Anemia complicating pregnancy, third trimester: Secondary | ICD-10-CM | POA: Diagnosis present

## 2021-07-09 DIAGNOSIS — O99824 Streptococcus B carrier state complicating childbirth: Secondary | ICD-10-CM | POA: Diagnosis present

## 2021-07-09 DIAGNOSIS — Z148 Genetic carrier of other disease: Secondary | ICD-10-CM

## 2021-07-09 DIAGNOSIS — R87612 Low grade squamous intraepithelial lesion on cytologic smear of cervix (LGSIL): Secondary | ICD-10-CM | POA: Diagnosis present

## 2021-07-09 DIAGNOSIS — Z3490 Encounter for supervision of normal pregnancy, unspecified, unspecified trimester: Secondary | ICD-10-CM | POA: Diagnosis present

## 2021-07-09 DIAGNOSIS — Z3A37 37 weeks gestation of pregnancy: Secondary | ICD-10-CM

## 2021-07-09 DIAGNOSIS — B951 Streptococcus, group B, as the cause of diseases classified elsewhere: Secondary | ICD-10-CM | POA: Diagnosis present

## 2021-07-09 DIAGNOSIS — Z3689 Encounter for other specified antenatal screening: Secondary | ICD-10-CM

## 2021-07-09 DIAGNOSIS — Z20822 Contact with and (suspected) exposure to covid-19: Secondary | ICD-10-CM | POA: Diagnosis present

## 2021-07-09 DIAGNOSIS — R519 Headache, unspecified: Secondary | ICD-10-CM

## 2021-07-09 DIAGNOSIS — O0990 Supervision of high risk pregnancy, unspecified, unspecified trimester: Secondary | ICD-10-CM

## 2021-07-09 DIAGNOSIS — O10919 Unspecified pre-existing hypertension complicating pregnancy, unspecified trimester: Secondary | ICD-10-CM

## 2021-07-09 DIAGNOSIS — D6959 Other secondary thrombocytopenia: Secondary | ICD-10-CM | POA: Diagnosis present

## 2021-07-09 DIAGNOSIS — O9912 Other diseases of the blood and blood-forming organs and certain disorders involving the immune mechanism complicating childbirth: Secondary | ICD-10-CM | POA: Diagnosis present

## 2021-07-09 DIAGNOSIS — O9902 Anemia complicating childbirth: Secondary | ICD-10-CM | POA: Diagnosis present

## 2021-07-09 NOTE — MAU Note (Signed)
Pt reports her b/p has been elevated 150/110 at home tonight, some blurred vision, nausea and her ankles are swollen.

## 2021-07-10 ENCOUNTER — Other Ambulatory Visit (HOSPITAL_COMMUNITY): Payer: Managed Care, Other (non HMO)

## 2021-07-10 ENCOUNTER — Encounter (HOSPITAL_COMMUNITY): Payer: Self-pay | Admitting: Obstetrics and Gynecology

## 2021-07-10 ENCOUNTER — Other Ambulatory Visit: Payer: Self-pay

## 2021-07-10 ENCOUNTER — Inpatient Hospital Stay (HOSPITAL_COMMUNITY): Payer: Managed Care, Other (non HMO) | Admitting: Anesthesiology

## 2021-07-10 DIAGNOSIS — G43919 Migraine, unspecified, intractable, without status migrainosus: Secondary | ICD-10-CM

## 2021-07-10 DIAGNOSIS — D563 Thalassemia minor: Secondary | ICD-10-CM | POA: Diagnosis present

## 2021-07-10 DIAGNOSIS — O1002 Pre-existing essential hypertension complicating childbirth: Secondary | ICD-10-CM | POA: Diagnosis present

## 2021-07-10 DIAGNOSIS — O10013 Pre-existing essential hypertension complicating pregnancy, third trimester: Secondary | ICD-10-CM | POA: Diagnosis not present

## 2021-07-10 DIAGNOSIS — Z3A37 37 weeks gestation of pregnancy: Secondary | ICD-10-CM | POA: Diagnosis not present

## 2021-07-10 DIAGNOSIS — D6959 Other secondary thrombocytopenia: Secondary | ICD-10-CM | POA: Diagnosis present

## 2021-07-10 DIAGNOSIS — Z3490 Encounter for supervision of normal pregnancy, unspecified, unspecified trimester: Secondary | ICD-10-CM | POA: Diagnosis present

## 2021-07-10 DIAGNOSIS — O9902 Anemia complicating childbirth: Secondary | ICD-10-CM | POA: Diagnosis present

## 2021-07-10 DIAGNOSIS — N879 Dysplasia of cervix uteri, unspecified: Secondary | ICD-10-CM

## 2021-07-10 DIAGNOSIS — R87612 Low grade squamous intraepithelial lesion on cytologic smear of cervix (LGSIL): Secondary | ICD-10-CM | POA: Diagnosis present

## 2021-07-10 DIAGNOSIS — Z148 Genetic carrier of other disease: Secondary | ICD-10-CM

## 2021-07-10 DIAGNOSIS — O99353 Diseases of the nervous system complicating pregnancy, third trimester: Secondary | ICD-10-CM | POA: Diagnosis not present

## 2021-07-10 DIAGNOSIS — Z20822 Contact with and (suspected) exposure to covid-19: Secondary | ICD-10-CM | POA: Diagnosis present

## 2021-07-10 DIAGNOSIS — O9912 Other diseases of the blood and blood-forming organs and certain disorders involving the immune mechanism complicating childbirth: Secondary | ICD-10-CM | POA: Diagnosis present

## 2021-07-10 DIAGNOSIS — O99824 Streptococcus B carrier state complicating childbirth: Secondary | ICD-10-CM | POA: Diagnosis present

## 2021-07-10 DIAGNOSIS — O1092 Unspecified pre-existing hypertension complicating childbirth: Secondary | ICD-10-CM | POA: Diagnosis not present

## 2021-07-10 DIAGNOSIS — O3443 Maternal care for other abnormalities of cervix, third trimester: Secondary | ICD-10-CM | POA: Diagnosis not present

## 2021-07-10 LAB — CBC
HCT: 32.4 % — ABNORMAL LOW (ref 36.0–46.0)
Hemoglobin: 9.9 g/dL — ABNORMAL LOW (ref 12.0–15.0)
MCH: 22.7 pg — ABNORMAL LOW (ref 26.0–34.0)
MCHC: 30.6 g/dL (ref 30.0–36.0)
MCV: 74.1 fL — ABNORMAL LOW (ref 80.0–100.0)
Platelets: 144 10*3/uL — ABNORMAL LOW (ref 150–400)
RBC: 4.37 MIL/uL (ref 3.87–5.11)
RDW: 16 % — ABNORMAL HIGH (ref 11.5–15.5)
WBC: 9.3 10*3/uL (ref 4.0–10.5)
nRBC: 0 % (ref 0.0–0.2)

## 2021-07-10 LAB — CBC WITH DIFFERENTIAL/PLATELET
Abs Immature Granulocytes: 0.03 10*3/uL (ref 0.00–0.07)
Basophils Absolute: 0 10*3/uL (ref 0.0–0.1)
Basophils Relative: 0 %
Eosinophils Absolute: 0 10*3/uL (ref 0.0–0.5)
Eosinophils Relative: 1 %
HCT: 31.1 % — ABNORMAL LOW (ref 36.0–46.0)
Hemoglobin: 10 g/dL — ABNORMAL LOW (ref 12.0–15.0)
Immature Granulocytes: 0 %
Lymphocytes Relative: 24 %
Lymphs Abs: 2 10*3/uL (ref 0.7–4.0)
MCH: 23.5 pg — ABNORMAL LOW (ref 26.0–34.0)
MCHC: 32.2 g/dL (ref 30.0–36.0)
MCV: 73 fL — ABNORMAL LOW (ref 80.0–100.0)
Monocytes Absolute: 0.7 10*3/uL (ref 0.1–1.0)
Monocytes Relative: 9 %
Neutro Abs: 5.5 10*3/uL (ref 1.7–7.7)
Neutrophils Relative %: 66 %
Platelets: 139 10*3/uL — ABNORMAL LOW (ref 150–400)
RBC: 4.26 MIL/uL (ref 3.87–5.11)
RDW: 15.9 % — ABNORMAL HIGH (ref 11.5–15.5)
WBC: 8.3 10*3/uL (ref 4.0–10.5)
nRBC: 0 % (ref 0.0–0.2)

## 2021-07-10 LAB — COMPREHENSIVE METABOLIC PANEL
ALT: 11 U/L (ref 0–44)
AST: 19 U/L (ref 15–41)
Albumin: 2.6 g/dL — ABNORMAL LOW (ref 3.5–5.0)
Alkaline Phosphatase: 140 U/L — ABNORMAL HIGH (ref 38–126)
Anion gap: 5 (ref 5–15)
BUN: <5 mg/dL — ABNORMAL LOW (ref 6–20)
CO2: 23 mmol/L (ref 22–32)
Calcium: 8.9 mg/dL (ref 8.9–10.3)
Chloride: 107 mmol/L (ref 98–111)
Creatinine, Ser: 0.7 mg/dL (ref 0.44–1.00)
GFR, Estimated: >60 mL/min (ref >60)
Glucose, Bld: 109 mg/dL — ABNORMAL HIGH (ref 70–99)
Potassium: 3.6 mmol/L (ref 3.5–5.1)
Sodium: 135 mmol/L (ref 135–145)
Total Bilirubin: 0.5 mg/dL (ref 0.3–1.2)
Total Protein: 5.8 g/dL — ABNORMAL LOW (ref 6.5–8.1)

## 2021-07-10 LAB — PROTEIN / CREATININE RATIO, URINE
Creatinine, Urine: 28.85 mg/dL
Total Protein, Urine: <6 mg/dL

## 2021-07-10 LAB — TYPE AND SCREEN
ABO/RH(D): O POS
Antibody Screen: NEGATIVE

## 2021-07-10 LAB — RESP PANEL BY RT-PCR (FLU A&B, COVID) ARPGX2
Influenza A by PCR: NEGATIVE
Influenza B by PCR: NEGATIVE
SARS Coronavirus 2 by RT PCR: NEGATIVE

## 2021-07-10 LAB — RPR: RPR Ser Ql: NONREACTIVE

## 2021-07-10 MED ORDER — EPHEDRINE 5 MG/ML INJ: 10.0000 mg | INTRAVENOUS | Status: DC | PRN

## 2021-07-10 MED ORDER — COCONUT OIL OIL: 1.0000 "application " | TOPICAL_OIL | Status: DC | PRN

## 2021-07-10 MED ORDER — DIBUCAINE (PERIANAL) 1 % EX OINT: 1.0000 "application " | TOPICAL_OINTMENT | CUTANEOUS | Status: DC | PRN

## 2021-07-10 MED ORDER — ONDANSETRON HCL 4 MG PO TABS: 4.0000 mg | ORAL_TABLET | ORAL | Status: DC | PRN

## 2021-07-10 MED ORDER — OXYTOCIN-SODIUM CHLORIDE 30-0.9 UT/500ML-% IV SOLN
1.0000 m[IU]/min | INTRAVENOUS | Status: DC
  Administered 2021-07-10: 2 m[IU]/min via INTRAVENOUS
  Filled 2021-07-10: qty 500

## 2021-07-10 MED ORDER — TETANUS-DIPHTH-ACELL PERTUSSIS 5-2.5-18.5 LF-MCG/0.5 IM SUSY: 0.5000 mL | PREFILLED_SYRINGE | Freq: Once | INTRAMUSCULAR | Status: DC

## 2021-07-10 MED ORDER — LABETALOL HCL 100 MG PO TABS
100.0000 mg | ORAL_TABLET | Freq: Two times a day (BID) | ORAL | Status: DC
  Administered 2021-07-10: 100 mg via ORAL
  Filled 2021-07-10: qty 1

## 2021-07-10 MED ORDER — LACTATED RINGERS IV SOLN
500.0000 mL | INTRAVENOUS | Status: DC | PRN
  Administered 2021-07-10: 1000 mL via INTRAVENOUS

## 2021-07-10 MED ORDER — ACETAMINOPHEN 500 MG PO TABS
1000.0000 mg | ORAL_TABLET | Freq: Once | ORAL | Status: AC
  Administered 2021-07-10: 1000 mg via ORAL
  Filled 2021-07-10: qty 2

## 2021-07-10 MED ORDER — METOCLOPRAMIDE HCL 10 MG PO TABS
10.0000 mg | ORAL_TABLET | Freq: Once | ORAL | Status: AC
  Administered 2021-07-10: 10 mg via ORAL
  Filled 2021-07-10: qty 1

## 2021-07-10 MED ORDER — PRENATAL MULTIVITAMIN CH
1.0000 | ORAL_TABLET | Freq: Every day | ORAL | Status: DC
  Administered 2021-07-11: 1 via ORAL
  Filled 2021-07-10: qty 1

## 2021-07-10 MED ORDER — DIPHENHYDRAMINE HCL 50 MG/ML IJ SOLN: 12.5000 mg | INTRAMUSCULAR | Status: DC | PRN

## 2021-07-10 MED ORDER — ONDANSETRON HCL 4 MG/2ML IJ SOLN: 4.0000 mg | Freq: Four times a day (QID) | INTRAMUSCULAR | Status: DC | PRN

## 2021-07-10 MED ORDER — PENICILLIN G POT IN DEXTROSE 60000 UNIT/ML IV SOLN
3.0000 10*6.[IU] | INTRAVENOUS | Status: DC
  Administered 2021-07-10 (×2): 3 10*6.[IU] via INTRAVENOUS
  Filled 2021-07-10 (×2): qty 50

## 2021-07-10 MED ORDER — WITCH HAZEL-GLYCERIN EX PADS: 1.0000 "application " | MEDICATED_PAD | CUTANEOUS | Status: DC | PRN

## 2021-07-10 MED ORDER — OXYTOCIN-SODIUM CHLORIDE 30-0.9 UT/500ML-% IV SOLN
2.5000 [IU]/h | INTRAVENOUS | Status: DC
  Administered 2021-07-10: 2.5 [IU]/h via INTRAVENOUS

## 2021-07-10 MED ORDER — FENTANYL-BUPIVACAINE-NACL 0.5-0.125-0.9 MG/250ML-% EP SOLN: 12.0000 mL/h | EPIDURAL | Status: DC | PRN

## 2021-07-10 MED ORDER — LIDOCAINE HCL (PF) 1 % IJ SOLN: 30.0000 mL | INTRAMUSCULAR | Status: DC | PRN

## 2021-07-10 MED ORDER — PHENYLEPHRINE 40 MCG/ML (10ML) SYRINGE FOR IV PUSH (FOR BLOOD PRESSURE SUPPORT): 80.0000 ug | PREFILLED_SYRINGE | INTRAVENOUS | Status: DC | PRN

## 2021-07-10 MED ORDER — OXYCODONE-ACETAMINOPHEN 5-325 MG PO TABS: 2.0000 | ORAL_TABLET | ORAL | Status: DC | PRN

## 2021-07-10 MED ORDER — ACETAMINOPHEN 500 MG PO TABS: 1000.0000 mg | ORAL_TABLET | Freq: Four times a day (QID) | ORAL | Status: DC | PRN

## 2021-07-10 MED ORDER — SOD CITRATE-CITRIC ACID 500-334 MG/5ML PO SOLN: 30.0000 mL | ORAL | Status: DC | PRN

## 2021-07-10 MED ORDER — ONDANSETRON HCL 4 MG/2ML IJ SOLN: 4.0000 mg | INTRAMUSCULAR | Status: DC | PRN

## 2021-07-10 MED ORDER — LACTATED RINGERS IV SOLN: 500.0000 mL | Freq: Once | INTRAVENOUS | Status: DC

## 2021-07-10 MED ORDER — SODIUM CHLORIDE 0.9 % IV SOLN
5.0000 10*6.[IU] | Freq: Once | INTRAVENOUS | Status: AC
  Administered 2021-07-10: 5 10*6.[IU] via INTRAVENOUS
  Filled 2021-07-10: qty 5

## 2021-07-10 MED ORDER — MEDROXYPROGESTERONE ACETATE 150 MG/ML IM SUSP: 150.0000 mg | INTRAMUSCULAR | Status: DC | PRN

## 2021-07-10 MED ORDER — CYCLOBENZAPRINE HCL 5 MG PO TABS
10.0000 mg | ORAL_TABLET | Freq: Once | ORAL | Status: AC
  Administered 2021-07-10: 10 mg via ORAL
  Filled 2021-07-10: qty 2

## 2021-07-10 MED ORDER — BUTORPHANOL TARTRATE 1 MG/ML IJ SOLN
2.0000 mg | INTRAMUSCULAR | Status: DC | PRN
  Administered 2021-07-10 (×2): 2 mg via INTRAVENOUS
  Filled 2021-07-10 (×2): qty 2

## 2021-07-10 MED ORDER — TERBUTALINE SULFATE 1 MG/ML IJ SOLN: 0.2500 mg | Freq: Once | INTRAMUSCULAR | Status: DC | PRN

## 2021-07-10 MED ORDER — OXYTOCIN BOLUS FROM INFUSION: 333.0000 mL | Freq: Once | INTRAVENOUS | Status: DC

## 2021-07-10 MED ORDER — METOCLOPRAMIDE HCL 5 MG/ML IJ SOLN
10.0000 mg | Freq: Once | INTRAMUSCULAR | Status: DC
  Filled 2021-07-10: qty 2

## 2021-07-10 MED ORDER — SENNOSIDES-DOCUSATE SODIUM 8.6-50 MG PO TABS
2.0000 | ORAL_TABLET | Freq: Every day | ORAL | Status: DC
  Administered 2021-07-11: 2 via ORAL
  Filled 2021-07-10: qty 2

## 2021-07-10 MED ORDER — PHENYLEPHRINE 40 MCG/ML (10ML) SYRINGE FOR IV PUSH (FOR BLOOD PRESSURE SUPPORT)
PREFILLED_SYRINGE | INTRAVENOUS | Status: AC
  Filled 2021-07-10: qty 10

## 2021-07-10 MED ORDER — SIMETHICONE 80 MG PO CHEW: 80.0000 mg | CHEWABLE_TABLET | ORAL | Status: DC | PRN

## 2021-07-10 MED ORDER — OXYCODONE-ACETAMINOPHEN 5-325 MG PO TABS: 1.0000 | ORAL_TABLET | ORAL | Status: DC | PRN

## 2021-07-10 MED ORDER — DIPHENHYDRAMINE HCL 25 MG PO CAPS: 25.0000 mg | ORAL_CAPSULE | Freq: Four times a day (QID) | ORAL | Status: DC | PRN

## 2021-07-10 MED ORDER — MEASLES, MUMPS & RUBELLA VAC IJ SOLR: 0.5000 mL | Freq: Once | INTRAMUSCULAR | Status: DC

## 2021-07-10 MED ORDER — ACETAMINOPHEN 325 MG PO TABS: 650.0000 mg | ORAL_TABLET | ORAL | Status: DC | PRN

## 2021-07-10 MED ORDER — BENZOCAINE-MENTHOL 20-0.5 % EX AERO
1.0000 "application " | INHALATION_SPRAY | CUTANEOUS | Status: DC | PRN
  Administered 2021-07-10: 1 via TOPICAL
  Filled 2021-07-10: qty 56

## 2021-07-10 MED ORDER — FENTANYL-BUPIVACAINE-NACL 0.5-0.125-0.9 MG/250ML-% EP SOLN
EPIDURAL | Status: AC
  Filled 2021-07-10: qty 250

## 2021-07-10 MED ORDER — IBUPROFEN 600 MG PO TABS
600.0000 mg | ORAL_TABLET | Freq: Four times a day (QID) | ORAL | Status: DC
  Administered 2021-07-10 – 2021-07-11 (×5): 600 mg via ORAL
  Filled 2021-07-10 (×5): qty 1

## 2021-07-10 MED ORDER — LACTATED RINGERS IV SOLN: INTRAVENOUS | Status: DC

## 2021-07-10 MED ORDER — NIFEDIPINE ER OSMOTIC RELEASE 30 MG PO TB24
30.0000 mg | ORAL_TABLET | Freq: Every day | ORAL | Status: DC
  Administered 2021-07-10 – 2021-07-11 (×2): 30 mg via ORAL
  Filled 2021-07-10 (×2): qty 1

## 2021-07-10 NOTE — MAU Provider Note (Signed)
History     CSN: [id number]  Arrival date and time: 07/09/21 2250   Event Date/Time   First Provider Initiated Contact with Patient 07/10/21 0010      Chief Complaint  Patient presents with   Contractions   Hypertension   Michelle Thompson is a 25 y.o. G3P2002 at [redacted]w[redacted]d who presents to [facility]U for preeclampsia evaluation after she started experiencing an 8/10 headache, blurry vision in both eyes that is intermittent, seeing spots intermittently, swelling in her ankles and nausea without vomiting. Patient denies history of migraine, tension or other headaches. Patient reports elevated BP at home and reports she took her labetalol twice today as prescribed. Patient also reports she took Phenergan recently  Patient also reports she took Tylenol for her headache around 12noon today, but reports that it did not help. Patient also takes Flexeril PRN and last took last night.  Pt denies vomiting, epigastric pain, swelling in face and hands, sudden weight gain. Pt denies chest pain and SOB.  Pt denies constipation, diarrhea, or urinary problems. Pt denies fever, chills, fatigue, sweating or changes in appetite. Pt denies dizziness, light-headedness, weakness.  Pt denies VB, ctx, LOF and reports good FM.  Current pregnancy problems? cHTN on labetalol, GBS+ Blood Type? O Positive Allergies? Fentanyl Current medications? Labetalol, Tylenol PRN, Flexeril PRN Current PNC & next appt? 07/11/2021 with MFM   OB History     Gravida  3   Para  2   Term  2   Preterm      AB      Living  2      SAB      IAB      Ectopic      Multiple  0   Live Births  2           Past Medical History:  Diagnosis Date   Complication of anesthesia    fentanyl itching   Hypertension    Gestational Hypertension    Past Surgical History:  Procedure Laterality Date   NO PAST SURGERIES      Family History  Problem Relation Age of Onset   Lupus Mother    Healthy Father    Asthma  Brother    Diabetes Maternal Grandmother    COPD Maternal Grandmother    Cancer Maternal Grandmother    Diabetes Maternal Grandfather    Stroke Maternal Grandfather     Social History   Tobacco Use   Smoking status: Never   Smokeless tobacco: Never  Vaping Use   Vaping Use: Never used  Substance Use Topics   Alcohol use: No   Drug use: No    Allergies:  Allergies  Allergen Reactions   Fentanyl Shortness Of Breath and Itching    Medications Prior to Admission  Medication Sig Dispense Refill Last Dose   cyclobenzaprine (FLEXERIL) 10 MG tablet Take 1 tablet (10 mg total) by mouth 3 (three) times daily as needed for muscle spasms. Can take 1/2 or 1 tablet as directed. 30 tablet 0 Past Week   ferrous sulfate (FERROUSUL) 325 (65 FE) MG tablet Take 1 tablet (325 mg total) by mouth 2 (two) times daily. 60 tablet 1 Past Week   labetalol (NORMODYNE) 100 MG tablet Take 1 tablet (100 mg total) by mouth 2 (two) times daily. 60 tablet 3 07/09/2021   Prenatal Vit-Fe Fumarate-FA (PRENATAL MULTIVITAMIN) TABS tablet Take 1 tablet by mouth daily at 12 noon.   Past Week   promethazine (PHENERGAN) 25 MG  tablet Take 1 tablet (25 mg total) by mouth every 6 (six) hours as needed for nausea or vomiting. 30 tablet 2 07/09/2021    Review of Systems  Constitutional:  Negative for chills, diaphoresis, fatigue and fever.  Eyes:  Positive for visual disturbance.  Respiratory:  Negative for shortness of breath.   Cardiovascular:  Positive for leg swelling. Negative for chest pain.  Gastrointestinal:  Positive for nausea. Negative for abdominal pain, constipation, diarrhea and vomiting.  Genitourinary:  Negative for dysuria, flank pain, frequency, pelvic pain, urgency, vaginal bleeding and vaginal discharge.  Neurological:  Positive for headaches. Negative for dizziness, weakness and light-headedness.   Physical Exam   Blood pressure (!) 130/94, pulse 98, temperature 98.1 F (36.7 C), temperature source  Oral, resp. rate 16, height 5\' 2"  (1.575 m), weight 78 kg, last menstrual period 10/15/2020, SpO2 97 %, not currently breastfeeding.  Patient Vitals for the past 24 hrs:  BP Temp Temp src Pulse Resp SpO2 Height Weight  07/10/21 0110 -- -- -- -- -- 97 % -- --  07/10/21 0105 -- -- -- -- -- 99 % -- --  07/10/21 0100 (!) 130/94 -- -- 98 -- 100 % -- --  07/10/21 0055 -- -- -- -- -- 99 % -- --  07/10/21 0050 -- -- -- -- -- 96 % -- --  07/10/21 0045 107/60 -- -- (!) 105 -- 97 % -- --  07/10/21 0040 -- -- -- -- -- 97 % -- --  07/10/21 0035 -- -- -- -- -- 97 % -- --  07/10/21 0030 (!) 110/58 -- -- (!) 115 -- 97 % -- --  07/10/21 0025 -- -- -- -- -- 97 % -- --  07/10/21 0020 -- -- -- -- -- 98 % -- --  07/10/21 0015 -- -- -- -- -- 99 % -- --  07/10/21 0010 -- -- -- -- -- 98 % -- --  07/10/21 0005 -- -- -- -- -- 97 % -- --  07/10/21 0000 129/68 -- -- (!) 103 -- 98 % -- --  07/09/21 2355 -- -- -- -- -- 98 % -- --  07/09/21 2353 137/82 -- -- (!) 108 -- -- -- --  07/09/21 2311 (!) 144/93 98.1 F (36.7 C) Oral (!) 105 16 98 % 5\' 2"  (1.575 m) 78 kg   Physical Exam Vitals and nursing note reviewed.  Constitutional:      General: She is not in acute distress.    Appearance: Normal appearance. She is not ill-appearing, toxic-appearing or diaphoretic.  HENT:     Head: Normocephalic and atraumatic.  Pulmonary:     Effort: Pulmonary effort is normal.  Neurological:     Mental Status: She is alert and oriented to person, place, and time.  Psychiatric:        Mood and Affect: Mood normal.        Behavior: Behavior normal.        Thought Content: Thought content normal.        Judgment: Judgment normal.   Results for orders placed or performed during the hospital encounter of 07/09/21 (from the past 24 hour(s))  Protein / creatinine ratio, urine     Status: None   Collection Time: 07/09/21 11:29 PM  Result Value Ref Range   Creatinine, Urine 28.85 mg/dL   Total Protein, Urine <6 mg/dL   Protein  Creatinine Ratio        0.00 - 0.15 mg/mg[Cre]  CBC with Differential/Platelet  Status: Abnormal   Collection Time: 07/10/21 12:17 AM  Result Value Ref Range   WBC 8.3 4.0 - 10.5 K/uL   RBC 4.26 3.87 - 5.11 MIL/uL   Hemoglobin 10.0 (L) 12.0 - 15.0 g/dL   HCT 16.1 (L) 09.6 - 04.5 %   MCV 73.0 (L) 80.0 - 100.0 fL   MCH 23.5 (L) 26.0 - 34.0 pg   MCHC 32.2 30.0 - 36.0 g/dL   RDW 40.9 (H) 81.1 - 91.4 %   Platelets 139 (L) 150 - 400 K/uL   nRBC 0.0 0.0 - 0.2 %   Neutrophils Relative % 66 %   Neutro Abs 5.5 1.7 - 7.7 K/uL   Lymphocytes Relative 24 %   Lymphs Abs 2.0 0.7 - 4.0 K/uL   Monocytes Relative 9 %   Monocytes Absolute 0.7 0.1 - 1.0 K/uL   Eosinophils Relative 1 %   Eosinophils Absolute 0.0 0.0 - 0.5 K/uL   Basophils Relative 0 %   Basophils Absolute 0.0 0.0 - 0.1 K/uL   Immature Granulocytes 0 %   Abs Immature Granulocytes 0.03 0.00 - 0.07 K/uL  Comprehensive metabolic panel     Status: Abnormal   Collection Time: 07/10/21 12:17 AM  Result Value Ref Range   Sodium 135 135 - 145 mmol/L   Potassium 3.6 3.5 - 5.1 mmol/L   Chloride 107 98 - 111 mmol/L   CO2 23 22 - 32 mmol/L   Glucose, Bld 109 (H) 70 - 99 mg/dL   BUN <5 (L) 6 - 20 mg/dL   Creatinine, Ser 7.82 0.44 - 1.00 mg/dL   Calcium 8.9 8.9 - 95.6 mg/dL   Total Protein 5.8 (L) 6.5 - 8.1 g/dL   Albumin 2.6 (L) 3.5 - 5.0 g/dL   AST 19 15 - 41 U/L   ALT 11 0 - 44 U/L   Alkaline Phosphatase 140 (H) 38 - 126 U/L   Total Bilirubin 0.5 0.3 - 1.2 mg/dL   GFR, Estimated >21 >30 mL/min   Anion gap 5 5 - 15    [place] MFM FETAL BPP WO NON STRESS  Result Date: 06/29/2021 ----------------------------------------------------------------------  OBSTETRICS REPORT                       (Signed Final 06/29/2021 09:25 am) ---------------------------------------------------------------------- Patient Info  ID #:       [id number]                          D.O.B.:  03/31/1996 (25 yrs)  Name:       Michelle Thompson                 Visit Date:  06/29/2021 08:46 am ---------------------------------------------------------------------- Performed By  Attending:        Noralee Space MD        Ref. Address:     Faculty  Performed By:     Emeline Darling BS,      [facility]                                     [facility]  Referred By:      Catalina Antigua MD ---------------------------------------------------------------------- Orders  #  Description                           Code        Ordered By  1  [place] MFM FETAL BPP WO NON               76819.01    RAVI SHANKAR     STRESS  2  [place] MFM OB FOLLOW UP                   40981.19    RAVI [facility] ----------------------------------------------------------------------  #  Order #                     Accession #                Episode #  1  [id number]                   [id number]                 [id number]  2  [id number]                   [id number]                 [id number] ---------------------------------------------------------------------- Indications  Polyhydramnios, third trimester, antepartum    O40.3XX0  condition or complication, unspecified fetus  [redacted] weeks gestation of pregnancy                Z3A.36  Hypertension - Chronic/Pre-existing            O10.019  (labetalol)  Genetic carrier (Alpha Thal, SMA)              Z14.8  Medical complication of pregnancy (Covid-      O26.90  19)  LR NIPS/ Negative AFP  Encounter for other antenatal screening        Z36.2  follow-up ---------------------------------------------------------------------- Fetal Evaluation  Num Of Fetuses:         1  Fetal Heart Rate(bpm):  164  Cardiac Activity:       Observed  Presentation:           Cephalic  Placenta:               Posterior  P. Cord Insertion:      Previously Visualized  Amniotic Fluid  AFI FV:      Polyhydramnios  AFI  Sum(cm)     %Tile       Largest Pocket(cm)  28.2            > 97        8.2  RUQ(cm)       RLQ(cm)       LUQ(cm)        LLQ(cm)  6.6           5.3  8.2            8.1 ---------------------------------------------------------------------- Biophysical Evaluation  Amniotic F.V:   Pocket => 2 cm             F. Tone:        Observed  F. Movement:    Observed                   Score:          8/8  F. Breathing:   Observed ---------------------------------------------------------------------- Biometry  BPD:      87.3  mm     G. Age:  35w 2d         33  %    CI:        75.37   %    70 - 86                                                          FL/HC:      21.9   %    20.1 - 22.1  HC:      318.9  mm     G. Age:  35w 6d         16  %    HC/AC:      0.98        0.93 - 1.11  AC:      325.5  mm     G. Age:  36w 3d         70  %    FL/BPD:     80.0   %    71 - 87  FL:       69.8  mm     G. Age:  35w 6d         37  %    FL/AC:      21.4   %    20 - 24  Est. FW:    2849  gm      6 lb 4 oz     51  % ---------------------------------------------------------------------- OB History  Gravidity:    3  Living:       2 ---------------------------------------------------------------------- Gestational Age  LMP:           38w 1d        Date:  10/05/20                 EDD:   07/12/21  Clinical EDD:  36w 5d                                        EDD:   07/22/21  U/S Today:     35w 6d                                        EDD:   07/28/21  Best:          36w 1d     Det. By:  U/S C R L  (01/02/21)    EDD:   07/26/21 ---------------------------------------------------------------------- Anatomy  Cranium:  Previously seen        Aortic Arch:            Previously seen  Cavum:                 Previously seen        Ductal Arch:            Previously seen  Ventricles:            Appears normal         Diaphragm:              Appears normal  Choroid Plexus:        Previously seen        Stomach:                Appears normal,  left                                                                        sided  Cerebellum:            Previously seen        Abdomen:                Previously seen  Posterior Fossa:       Previously seen        Abdominal Wall:         Previously seen  Nuchal Fold:           Previously seen        Cord Vessels:           Previously seen  Face:                  Orbits and profile     Kidneys:                Appear normal                         previously seen  Lips:                  Previously seen        Bladder:                Appears normal  Thoracic:              Previously seen        Spine:                  Previously seen  Heart:                 Appears normal         Upper Extremities:      Previously seen                         (4CH, axis, and                         situs)  RVOT:                  Previously seen        Lower  Extremities:      Previously seen  LVOT:                  Previously seen  Other:  Hands, feet, Nasal bone, Lenses, and 5th digits prev visualized. ---------------------------------------------------------------------- Cervix Uterus Adnexa  Cervix  Not visualized (advanced GA >24wks) ---------------------------------------------------------------------- Impression  Chronic hypertension.  Patient takes labetalol for control.  Blood pressure today at her office is 139/89 mmHg.  Fetal growth is appropriate for gestational age .Mild  polyhydramnios is seen (AFI 28 cm).  Antenatal testing is  reassuring.  BPP 8/8. Cephalic presentation.  I explained the finding of mild polyhydramnios that is not  usually associated with adverse outcomes.  Patient does not  have gestational diabetes. ---------------------------------------------------------------------- Recommendations  -NST scheduled at your office next week.  -BPP at our office on 07/11/21.  -Consider delivery at 37- or 39-weeks' gestation. ----------------------------------------------------------------------                  Noralee Space, MD Electronically Signed Final Report   06/29/2021 09:25 am ----------------------------------------------------------------------  [place] MFM FETAL BPP WO NON STRESS  Result Date: 06/20/2021 ----------------------------------------------------------------------  OBSTETRICS REPORT                       (Signed Final 06/20/2021 02:32 pm) ---------------------------------------------------------------------- Patient Info  ID #:       [id number]                          D.O.B.:  09-19-1996 (25 yrs)  Name:       Michelle Thompson                 Visit Date: 06/20/2021 02:19 pm ---------------------------------------------------------------------- Performed By  Attending:        Ma Rings MD         Ref. Address:     Faculty  Performed By:     Eden Lathe BS      Location:         [facility]                    [facility] RVT                                 [facility]                                                             [facility]  Referred By:      Gigi Gin                    CONSTANT MD ---------------------------------------------------------------------- Orders  #  Description                           Code  Ordered By  1  [place] MFM FETAL BPP WO NON               [id number]    RAVI [facility]     STRESS ----------------------------------------------------------------------  #  Order #                     Accession #                Episode #  1  [id number]                   [id number]                 [id number] ---------------------------------------------------------------------- Indications  Hypertension - Chronic/Pre-existing            O10.019  (labetalol)  [redacted] weeks gestation of pregnancy                Z3A.34  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 Cord Insertion:      Previously Visualized  Amniotic Fluid  AFI FV:      Within normal limits  AFI Sum(cm)     %Tile       Largest Pocket(cm)  21.8            82          8.2  RUQ(cm)       RLQ(cm)       LUQ(cm)        LLQ(cm)  8.2           4.4           7.3            1.9 ---------------------------------------------------------------------- Biophysical Evaluation  Amniotic F.V:   Within normal limits       F. Tone:        Observed  F. Movement:    Observed                   Score:          8/8  F. Breathing:   Observed ---------------------------------------------------------------------- OB History  Gravidity:    3  Living:       2 ---------------------------------------------------------------------- Gestational Age  LMP:           36w 6d        Date:  10/05/20                 EDD:   07/12/21  Clinical EDD:  35w 3d                                        EDD:   07/22/21  Best:  34w 6d     Det. By:  U/S C R L  (01/02/21)    EDD:   07/26/21 ---------------------------------------------------------------------- Comments  This patient was seen for a biophysical profile due to chronic  hypertension treated with labetalol.  She denies any problems  since her last exam.  A biophysical profile performed today was 8 out of 8.  There was normal amniotic fluid noted on today's ultrasound  exam.  She will return in 1 week for another biophysical profile. ----------------------------------------------------------------------                   Ma Rings, MD Electronically Signed Final Report   06/20/2021 02:32 pm ----------------------------------------------------------------------  [place] MFM FETAL BPP WO NON STRESS  Result Date:  06/15/2021 ----------------------------------------------------------------------  OBSTETRICS REPORT                       (Signed Final 06/15/2021 11:58 am) ---------------------------------------------------------------------- Patient Info  ID #:       [id number]                          D.O.B.:  [birth year] (25 yrs)  Name:       Michelle Thompson                 Visit Date: 06/15/2021 11:14 am ---------------------------------------------------------------------- Performed By  Attending:        Ma Rings MD         Ref. Address:     Faculty  Performed By:     Tommie Raymond BS,       Location:         [facility]                    RD[facility], RVT                                [facility]                                                             [facility]  Referred By:      Gigi Gin                    CONSTANT MD ---------------------------------------------------------------------- Orders  #  Description                           Code        Ordered By  1  [place] MFM FETAL BPP WO NON               40981.19    RAVI SHANKAR     STRESS ----------------------------------------------------------------------  #  Order #  Accession #                Episode #  1  [id number]                   [id number]                 [id number] ---------------------------------------------------------------------- Indications  Hypertension - Chronic/Pre-existing            O10.019  (labetalol)  [redacted] weeks gestation of pregnancy                Z3A.34  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 Cord Insertion:      Previously Visualized  Amniotic Fluid  AFI FV:      Within normal limits  AFI Sum(cm)     %Tile       Largest Pocket(cm)  24.4            93          6.3  RUQ(cm)       RLQ(cm)       LUQ(cm)        LLQ(cm)  6.1           6.3           5.9            6.1 ---------------------------------------------------------------------- Biophysical Evaluation  Amniotic F.V:   Pocket => 2 cm             F. Tone:        Observed  F. Movement:    Observed                   Score:          8/8  F. Breathing:   Observed ---------------------------------------------------------------------- OB History  Gravidity:    3  Living:       2 ---------------------------------------------------------------------- Gestational Age  LMP:           36w 1d        Date:  10/05/20                 EDD:   07/12/21  Clinical EDD:  34w 5d                                        EDD:   07/22/21  Best:          34w 1d     Det. By:  U/S C R L  (01/02/21)    EDD:   07/26/21 ---------------------------------------------------------------------- Anatomy  Ventricles:            Appears normal         Stomach:  Appears normal, left                                                                        sided  Heart:                 Appears normal         Kidneys:                Appear normal                         (4CH, axis, and                         situs)  Diaphragm:             Appears normal         Bladder:                Appears normal ---------------------------------------------------------------------- Cervix Uterus Adnexa  Cervix  Not visualized (advanced GA >24wks)  Uterus  No abnormality visualized.  Right Ovary  Not visualized.  Left Ovary  Not visualized.  Cul De Sac  No free fluid seen.  Adnexa  No adnexal mass visualized. ---------------------------------------------------------------------- Comments  This patient was seen for a biophysical profile due to chronic   hypertension treated with labetalol.  She denies any problems  since her last exam.  A biophysical profile performed today was 8 out of 8.  There was normal amniotic fluid noted on today's ultrasound  exam.  She will return in 1 week for another biophysical profile. ----------------------------------------------------------------------                   Ma Rings, MD Electronically Signed Final Report   06/15/2021 11:58 am ----------------------------------------------------------------------  [place] MFM OB FOLLOW UP  Result Date: 06/29/2021 ----------------------------------------------------------------------  OBSTETRICS REPORT                       (Signed Final 06/29/2021 09:25 am) ---------------------------------------------------------------------- Patient Info  ID #:       [id number]                          D.O.B.:  18-Mar-1996 (25 yrs)  Name:       Michelle Thompson                 Visit Date: 06/29/2021 08:46 am ---------------------------------------------------------------------- Performed By  Attending:        Noralee Space MD        Ref. Address:     Faculty  Performed By:     Emeline Darling BS,      Location[facility]                    RD[facility]                                     [facility]  Referred By:      Catalina Antigua MD ---------------------------------------------------------------------- Orders  #  Description                           Code        Ordered By  1  [place] MFM FETAL BPP WO NON               76819.01    RAVI SHANKAR     STRESS  2  [place] MFM OB FOLLOW UP                   16109.60    RAVI [facility] ----------------------------------------------------------------------  #  Order #                     Accession #                Episode #  1  [id number]                   [id number]                 [id number]  2  [id number]                    [id number]                 [id number] ---------------------------------------------------------------------- Indications  Polyhydramnios, third trimester, antepartum    O40.3XX0  condition or complication, unspecified fetus  [redacted] weeks gestation of pregnancy                Z3A.36  Hypertension - Chronic/Pre-existing            O10.019  (labetalol)  Genetic carrier (Alpha Thal, SMA)              Z14.8  Medical complication of pregnancy (Covid-      O26.90  19)  LR NIPS/ Negative AFP  Encounter for other antenatal screening        Z36.2  follow-up ---------------------------------------------------------------------- Fetal Evaluation  Num Of Fetuses:         1  Fetal Heart Rate(bpm):  164  Cardiac Activity:       Observed  Presentation:           Cephalic  Placenta:               Posterior  P. Cord Insertion:      Previously Visualized  Amniotic Fluid  AFI FV:      Polyhydramnios  AFI Sum(cm)     %Tile       Largest Pocket(cm)  28.2            > 97        8.2  RUQ(cm)       RLQ(cm)       LUQ(cm)        LLQ(cm)  6.6           5.3  8.2            8.1 ---------------------------------------------------------------------- Biophysical Evaluation  Amniotic F.V:   Pocket => 2 cm             F. Tone:        Observed  F. Movement:    Observed                   Score:          8/8  F. Breathing:   Observed ---------------------------------------------------------------------- Biometry  BPD:      87.3  mm     G. Age:  35w 2d         33  %    CI:        75.37   %    70 - 86                                                          FL/HC:      21.9   %    20.1 - 22.1  HC:      318.9  mm     G. Age:  35w 6d         16  %    HC/AC:      0.98        0.93 - 1.11  AC:      325.5  mm     G. Age:  36w 3d         70  %    FL/BPD:     80.0   %    71 - 87  FL:       69.8  mm     G. Age:  35w 6d         37  %    FL/AC:      21.4   %    20 - 24  Est. FW:    2849  gm      6 lb 4 oz     51  %  ---------------------------------------------------------------------- OB History  Gravidity:    3  Living:       2 ---------------------------------------------------------------------- Gestational Age  LMP:           38w 1d        Date:  10/05/20                 EDD:   07/12/21  Clinical EDD:  36w 5d                                        EDD:   07/22/21  U/S Today:     35w 6d                                        EDD:   07/28/21  Best:          36w 1d     Det. By:  U/S C R L  (01/02/21)    EDD:   07/26/21 ---------------------------------------------------------------------- Anatomy  Cranium:  Previously seen        Aortic Arch:            Previously seen  Cavum:                 Previously seen        Ductal Arch:            Previously seen  Ventricles:            Appears normal         Diaphragm:              Appears normal  Choroid Plexus:        Previously seen        Stomach:                Appears normal, left                                                                        sided  Cerebellum:            Previously seen        Abdomen:                Previously seen  Posterior Fossa:       Previously seen        Abdominal Wall:         Previously seen  Nuchal Fold:           Previously seen        Cord Vessels:           Previously seen  Face:                  Orbits and profile     Kidneys:                Appear normal                         previously seen  Lips:                  Previously seen        Bladder:                Appears normal  Thoracic:              Previously seen        Spine:                  Previously seen  Heart:                 Appears normal         Upper Extremities:      Previously seen                         (4CH, axis, and                         situs)  RVOT:                  Previously seen        Lower Extremities:  Previously seen  LVOT:                  Previously seen  Other:  Hands, feet, Nasal bone, Lenses, and 5th digits prev visualized.  ---------------------------------------------------------------------- Cervix Uterus Adnexa  Cervix  Not visualized (advanced GA >24wks) ---------------------------------------------------------------------- Impression  Chronic hypertension.  Patient takes labetalol for control.  Blood pressure today at her office is 139/89 mmHg.  Fetal growth is appropriate for gestational age .Mild  polyhydramnios is seen (AFI 28 cm).  Antenatal testing is  reassuring.  BPP 8/8. Cephalic presentation.  I explained the finding of mild polyhydramnios that is not  usually associated with adverse outcomes.  Patient does not  have gestational diabetes. ---------------------------------------------------------------------- Recommendations  -NST scheduled at your office next week.  -BPP at our office on 07/11/21.  -Consider delivery at 7638- or 39-weeks' gestation. ----------------------------------------------------------------------                  Noralee Spaceavi Shankar, MD Electronically Signed Final Report   06/29/2021 09:25 am ----------------------------------------------------------------------  US FETAL BPP W/NONSTRESS  Result Date: 07/05/2021 ----------------------------------------------------------------------  OBSTETRICS REPORT                       (Signed Final 07/05/2021 04:23 pm) ---------------------------------------------------------------------- Patient Info  ID #:       [id number]                          D.O.B.:  January 31, 1996 (25 yrs)  Name:       Michelle Thompson                 Visit Date: 07/04/2021 01:37 pm ---------------------------------------------------------------------- Performed By  Attending:        Catalina AntiguaPeggy Constant MD      Ref. Address:     Faculty  Performed By:     Marchelle Folksiane Day RNC          [facility]  Referred By:  PEGGY                    CONSTANT MD ---------------------------------------------------------------------- Orders  #  Description                           Code        Ordered By  1  US FETAL BPP W/NONSTRESS              45409.8     PEGGY CONSTANT ----------------------------------------------------------------------  #  Order #                     Accession #                Episode #  1  [id number]                   [id number]                 [id number] ---------------------------------------------------------------------- Service(s) Provided  US Fetal BPP W NST                                    84132 ---------------------------------------------------------------------- Indications  [redacted] weeks gestation of pregnancy                Z3A.36  Hypertension - Chronic/Pre-existing            O10.019 ---------------------------------------------------------------------- Fetal Evaluation  Num Of Fetuses:         1  Preg. Location:         Intrauterine  Cardiac Activity:       Observed  Presentation:           Cephalic  Amniotic Fluid  AFI FV:      Subjectively upper-normal  AFI Sum(cm)     %Tile       Largest Pocket(cm)  23.96           93          8.07  RUQ(cm)       RLQ(cm)       LUQ(cm)        LLQ(cm)  8.07          2.94          6.68           6.27 ---------------------------------------------------------------------- Biophysical Evaluation  Amniotic F.V:   Pocket => 2 cm             F. Tone:        Observed  F. Movement:    Observed                   N.S.T:          Reactive  F. Breathing:   Observed                   Score:          10/10 ---------------------------------------------------------------------- OB History  Gravidity:    3  Living:       2 ---------------------------------------------------------------------- Gestational Age  LMP:           38w 6d        Date:  10/05/20                 EDD:   07/12/21   Clinical EDD:  37w 3d  EDD:   07/22/21  Best:          36w 6d     Det. By:  U/S C R L  (01/02/21)    EDD:   07/26/21 ---------------------------------------------------------------------- Impression  Viable fetus in cephalic presentation  BPP 10/10 ---------------------------------------------------------------------- Recommendations  Continue weekly antenatal testing till delivery . ----------------------------------------------------------------------                 Catalina Antigua, MD Electronically Signed Final Report   07/05/2021 04:23 pm ----------------------------------------------------------------------   MAU Course  Procedures  MDM -preeclampsia evaluation without severe range BP in MAU on admission -symptoms include: HA, blurred vision, seeing spots, bilateral ankle swelling, nausea; 1000mg  Tylenol, 10mg  Reglan, 10mg  Flexeril given -CBC: H/H 10/31.1, platelets 139 -CMP: serum creatinine 0.70, AST/ALT 19/11 -PCr: results below reportable range -EFM: reactive       -baseline: 135/140       -variability: moderate       -accels: present, 15x15       -decels: absent       -TOCO: irregular ctx (CE by RN, see RN note for additional details) -after medication administration, pt reports HA now 5/10 compared to 8/10 and not continuing to resolve, pt reports this is not a normal headache for her -consulted with Dr. , pt OK to be admitted with HA not resolving -admit to L&D for induction  Orders Placed This Encounter  Procedures   CBC with Differential/Platelet    Standing Status:   Standing    Number of Occurrences:   1   Comprehensive metabolic panel    Standing Status:   Standing    Number of Occurrences:   1   Protein / creatinine ratio, urine    Standing Status:   Standing    Number of Occurrences:   1   Meds ordered this encounter  Medications   acetaminophen (TYLENOL) tablet 1,000 mg   DISCONTD: metoCLOPramide (REGLAN) injection 10  mg   cyclobenzaprine (FLEXERIL) tablet 10 mg   metoCLOPramide (REGLAN) tablet 10 mg   Assessment and Plan   1. Chronic hypertension affecting pregnancy   2. Alpha thalassemia silent carrier   3. LGSIL on Pap smear of cervix   4. Supervision of high risk pregnancy, antepartum   5. Genetic carrier   6. Acute intractable headache, unspecified headache type   7. NST (non-stress test) reactive   8. [redacted] weeks gestation of pregnancy    -admit to L&D for induction  Daneen Volcy 07/10/2021, 2:48 AM

## 2021-07-10 NOTE — Progress Notes (Signed)
Labor Progress Note Michelle Thompson is a 25 y.o. G3P2002 at [redacted]w[redacted]d presented for IOL-cHTN. S: Doing well. Complaining of headache, improved with tylenol.   O:  BP 123/67   Pulse 95   Temp 98.1 F (36.7 C) (Oral)   Resp 18   Ht 5\' 2"  (1.575 m)   Wt 78 kg   LMP 10/15/2020   SpO2 98%   BMI 31.46 kg/m  EFM: baseline 150bpm/mod variability/+accels/no decels Toco: q1-5 min  CVE: Dilation: 5 Effacement (%): 50 Cervical Position: Posterior Station: -2 Presentation: Vertex Exam by:: Dr [email or web address]   A&P: 25 y.o. 30 [redacted]w[redacted]d presented for IOL-cHTN. #IOL: Pitocin started at 0500 given cervical exam, patient has made cervical change since last exam. Will defer AROM due to fetal station. #Pain: PRN #FWB: cat 1 #GBS positive urine, PCN #cHTN: Continue home labetalol 100 mg BID. preE labs normal, headache improved. No severe range BP. Continue to monitor. #Anemia of pregnancy: last hgb 10, asymptomatic, replete pp #Gestational thrombocytopenia: platelets 156-->139, will repeat cbc close to epidural and continue to monitor  [redacted]w[redacted]d, MD 11:13 AM

## 2021-07-10 NOTE — Progress Notes (Signed)
Labor Progress Note Michelle Thompson is a 25 y.o. G3P2002 at [redacted]w[redacted]d presented for IOL-cHTN. S: Doing well without complaints.  O:  BP 128/86   Pulse 91   Temp (!) 97.1 F (36.2 C) (Oral)   Resp 20   Ht 5\' 2"  (1.575 m)   Wt 78 kg   LMP 10/15/2020   SpO2 98%   BMI 31.46 kg/m  EFM: baseline 145bpm/mod variability/+accels/no decels Toco: q1-3 min  CVE: Dilation: 5 Effacement (%): 50 Cervical Position: Posterior Station: -2 Presentation: Vertex Exam by:: Dr [email or web address]   A&P: 25 y.o. 30 [redacted]w[redacted]d presented for IOL-cHTN. #IOL: Pitocin started at 0500, currently at 24mL/hr. AROM performed at this check with clear fluid, patient has made good cervical change. #Pain: PRN #FWB: cat 1 #GBS positive urine, PCN #cHTN: Continue home labetalol 100 mg BID. preE labs normal, headache improved. No severe range BP. Continue to monitor. #Anemia of pregnancy: last hgb 10, asymptomatic, replete pp #Gestational thrombocytopenia: platelets 156-->139-->144  4m, MD 12:48 PM

## 2021-07-10 NOTE — Progress Notes (Signed)
Telemetry monitor changed to wired monitors low batterry

## 2021-07-10 NOTE — H&P (Addendum)
OBSTETRIC ADMISSION HISTORY AND PHYSICAL  Michelle Thompson is a 25 y.o. female G58P2002 with IUP at [redacted]w[redacted]d by [redacted]w[redacted]d [place] presenting for IOL-cHTN (Labetolol 100mg  BID). She reports +FMs, No LOF, no VB, and RUQ pain.  She plans on bottle feeding. She is unsure about birth control but does have interest in the Nexplanon.  Notes of an intractable HA that is associated by spots in vision and some swelling in the lower extremities. HA has been present intermittently over the last few days and does not improve with tylenol at home.   She received her prenatal care at [facility]  Dating: By [redacted]w[redacted]d [redacted]w[redacted]d --->  Estimated Date of Delivery: 07/26/21  Sono:   06/29/21@[redacted]w[redacted]d , CWD, normal anatomy, cephalic presentation,  2849g, 51% EFW   Prenatal History/Complications:  cHTN (Labetolol BID 100 mg) Alpha thalassemia silent carrier  LSIL from pap on 01/09/2021  Past Medical History: Past Medical History:  Diagnosis Date   Complication of anesthesia    fentanyl itching   Hypertension    Gestational Hypertension    Past Surgical History: Past Surgical History:  Procedure Laterality Date   NO PAST SURGERIES      Obstetrical History: OB History     Gravida  3   Para  2   Term  2   Preterm      AB      Living  2      SAB      IAB      Ectopic      Multiple  0   Live Births  2           Social History Social History   Socioeconomic History   Marital status: Single    Spouse name: Not on file   Number of children: Not on file   Years of education: Not on file   Highest education level: Not on file  Occupational History   Not on file  Tobacco Use   Smoking status: Never   Smokeless tobacco: Never  Vaping Use   Vaping Use: Never used  Substance and Sexual Activity   Alcohol use: No   Drug use: No   Sexual activity: Yes    Partners: Male    Birth control/protection: None  Other Topics Concern   Not on file  Social History Narrative   Not on file   Social  Determinants of Health   Financial Resource Strain: Not on file  Food Insecurity: No Food Insecurity   Worried About Running Out of Food in the Last Year: Never true   Ran Out of Food in the Last Year: Never true  Transportation Needs: No Transportation Needs   Lack of Transportation (Medical): No   Lack of Transportation (Non-Medical): No  Physical Activity: Not on file  Stress: Not on file  Social Connections: Not on file    Family History: Family History  Problem Relation Age of Onset   Lupus Mother    Healthy Father    Asthma Brother    Diabetes Maternal Grandmother    COPD Maternal Grandmother    Cancer Maternal Grandmother    Diabetes Maternal Grandfather    Stroke Maternal Grandfather     Allergies: Allergies  Allergen Reactions   Fentanyl Shortness Of Breath and Itching    Pt denies allergies to latex, iodine, or shellfish.  Medications Prior to Admission  Medication Sig Dispense Refill Last Dose   cyclobenzaprine (FLEXERIL) 10 MG tablet Take 1 tablet (10 mg total) by mouth  3 (three) times daily as needed for muscle spasms. Can take 1/2 or 1 tablet as directed. 30 tablet 0 Past Week   ferrous sulfate (FERROUSUL) 325 (65 FE) MG tablet Take 1 tablet (325 mg total) by mouth 2 (two) times daily. 60 tablet 1 Past Week   labetalol (NORMODYNE) 100 MG tablet Take 1 tablet (100 mg total) by mouth 2 (two) times daily. 60 tablet 3 07/09/2021   Prenatal Vit-Fe Fumarate-FA (PRENATAL MULTIVITAMIN) TABS tablet Take 1 tablet by mouth daily at 12 noon.   Past Week   promethazine (PHENERGAN) 25 MG tablet Take 1 tablet (25 mg total) by mouth every 6 (six) hours as needed for nausea or vomiting. 30 tablet 2 07/09/2021     Review of Systems   All systems reviewed and negative except as stated in HPI  Blood pressure 134/80, pulse 91, temperature 98.3 F (36.8 C), temperature source Oral, resp. rate 16, height 5\' 2"  (1.575 m), weight 78 kg, last menstrual period 10/15/2020, SpO2 99  %, not currently breastfeeding. General appearance: alert and cooperative Lungs: Normal resp effort Heart: regular rate and rhythm Abdomen: soft, non-tender; gravid Pelvic: As stated below  Extremities: Homans sign is negative, no sign of DVT Presentation: cephalic  Fetal monitoringBaseline: 140 bpm, Variability: Good {> 6 bpm), Accelerations: Reactive, and Decelerations: Absent Uterine activityFrequency: Every ~6 minutes Dilation: 3 Effacement (%): 50 Station: -2 Exam by:: [email or web address], RN   Prenatal labs: ABO, Rh: O/Positive/-- (01/10 1359) Antibody: Negative (01/10 1359) Rubella: 16.70 (01/10 1359) RPR: NON REACTIVE (07/05 1818)  HBsAg: Negative (01/10 1359)  HIV: Non Reactive (05/18 1022)  GBS:    1 hr Glucola: passed Genetic screening:  low risk Anatomy [birth year]1982: nl  Prenatal Transfer Tool  Maternal Diabetes: No Genetic Screening: Normal Maternal Ultrasounds/Referrals: Normal Fetal Ultrasounds or other Referrals:  None Maternal Substance Abuse:  No Significant Maternal Medications:  Meds include: Other: Labetolol Significant Maternal Lab Results: Gbs pos  Results for orders placed or performed during the hospital encounter of 07/09/21 (from the past 24 hour(s))  Protein / creatinine ratio, urine   Collection Time: 07/09/21 11:29 PM  Result Value Ref Range   Creatinine, Urine 28.85 mg/dL   Total Protein, Urine <6 mg/dL   Protein Creatinine Ratio        0.00 - 0.15 mg/mg[Cre]  CBC with Differential/Platelet   Collection Time: 07/10/21 12:17 AM  Result Value Ref Range   WBC 8.3 4.0 - 10.5 K/uL   RBC 4.26 3.87 - 5.11 MIL/uL   Hemoglobin 10.0 (L) 12.0 - 15.0 g/dL   HCT 09/10/21 (L) 27.2 - 53.6 %   MCV 73.0 (L) 80.0 - 100.0 fL   MCH 23.5 (L) 26.0 - 34.0 pg   MCHC 32.2 30.0 - 36.0 g/dL   RDW 64.4 (H) 03.4 - 74.2 %   Platelets 139 (L) 150 - 400 K/uL   nRBC 0.0 0.0 - 0.2 %   Neutrophils Relative % 66 %   Neutro Abs 5.5 1.7 - 7.7 K/uL   Lymphocytes Relative 24 %    Lymphs Abs 2.0 0.7 - 4.0 K/uL   Monocytes Relative 9 %   Monocytes Absolute 0.7 0.1 - 1.0 K/uL   Eosinophils Relative 1 %   Eosinophils Absolute 0.0 0.0 - 0.5 K/uL   Basophils Relative 0 %   Basophils Absolute 0.0 0.0 - 0.1 K/uL   Immature Granulocytes 0 %   Abs Immature Granulocytes 0.03 0.00 - 0.07 K/uL  Comprehensive metabolic panel  Collection Time: 07/10/21 12:17 AM  Result Value Ref Range   Sodium 135 135 - 145 mmol/L   Potassium 3.6 3.5 - 5.1 mmol/L   Chloride 107 98 - 111 mmol/L   CO2 23 22 - 32 mmol/L   Glucose, Bld 109 (H) 70 - 99 mg/dL   BUN <5 (L) 6 - 20 mg/dL   Creatinine, Ser 8.46 0.44 - 1.00 mg/dL   Calcium 8.9 8.9 - 96.2 mg/dL   Total Protein 5.8 (L) 6.5 - 8.1 g/dL   Albumin 2.6 (L) 3.5 - 5.0 g/dL   AST 19 15 - 41 U/L   ALT 11 0 - 44 U/L   Alkaline Phosphatase 140 (H) 38 - 126 U/L   Total Bilirubin 0.5 0.3 - 1.2 mg/dL   GFR, Estimated >95 >28 mL/min   Anion gap 5 5 - 15    Patient Active Problem List   Diagnosis Date Noted   Encounter for induction of labor 07/10/2021   Normal labor 07/04/2021   Preterm labor 07/04/2021   Alpha thalassemia silent carrier 04/05/2021   Genetic carrier 04/05/2021   LGSIL on Pap smear of cervix 01/17/2021   GBS (group B streptococcus) UTI complicating pregnancy 01/15/2021   History of gestational hypertension 01/09/2021   Supervision of high risk pregnancy, antepartum 01/02/2021   Chronic hypertension affecting pregnancy 06/22/2020    Assessment/Plan:  Avari Nevares is a 25 y.o. G3P2002 at [redacted]w[redacted]d here for IOL-cHTN (Labetolol)  #IOL: Will continue with pit 2/2, titrate and monitor.  #cHTN: Pt on Labetolol 100 BID, awaiting PCR. CMP nl. Last bp 130/94-cont monitoring vitals. Pt is symptomatic with blurry vision, HA, and some lower extremity swelling.  #HA: S/P reglan, tylenol, and flexeril in MAU. Mildly improved pain from medication. Will continue with Percocet to try to alleviate pain. Will re-evaluate in a couple of  hours.  #Pain: PRN #FWB: cat1 #ID: GBS pos urine> PCN #MOF: bottle #MOC: unsure (possibly nexplanon) #Circ:  yes  Alfredo Martinez, MD, PGY1 [facility] [employer] 07/10/2021, 4:14 AM  CNM attestation:  I have seen and examined this patient; I agree with above documentation in the resident's note.   Alphonsine Minium is a 25 y.o. [id number] here for IOL due to cHTN (normal to mild range values with Lab 100mg  bid) however her H/A was unrelieved by Reglan and Flexeril in MAU.  PE: BP 121/79   Pulse (!) 103   Temp 98.3 F (36.8 C) (Oral)   Resp 18   Ht 5\' 2"  (1.575 m)   Wt 78 kg   LMP 10/15/2020   SpO2 99%   BMI 31.46 kg/m  Gen: calm comfortable, NAD Resp: normal effort, no distress Abd: gravid  ROS, labs, PMH reviewed  Plan: -Admit to Labor and Delivery -Plan Pit as IOL method due to favorable cx and continue to increase to achieve active labor -Had planned to try narcotic for H/A relief prior to considering mag sulfate since headaches are common for her, however she is now sleeping and so will let her rest and reeval status when she awakes -Follow platelets (139K on admission) -Anticipate vag del  CNM 07/10/2021, 7:26 AM

## 2021-07-10 NOTE — Anesthesia Preprocedure Evaluation (Signed)
Anesthesia Evaluation  Patient identified by MRN, date of birth, ID band Patient awake    Reviewed: Allergy & Precautions, H&P , Patient's Chart, lab work & pertinent test results  Airway Mallampati: II   Neck ROM: full    Dental no notable dental hx.    Pulmonary    breath sounds clear to auscultation       Cardiovascular hypertension,  Rhythm:regular Rate:Normal     Neuro/Psych  Headaches,    GI/Hepatic   Endo/Other    Renal/GU      Musculoskeletal   Abdominal   Peds  Hematology  (+) anemia ,   Anesthesia Other Findings   Reproductive/Obstetrics (+) Pregnancy                             Anesthesia Physical  Anesthesia Plan  ASA: 2  Anesthesia Plan: Epidural   Post-op Pain Management:    Induction:   PONV Risk Score and Plan:   Airway Management Planned:   Additional Equipment:   Intra-op Plan:   Post-operative Plan:   Informed Consent: I have reviewed the patients History and Physical, chart, labs and discussed the procedure including the risks, benefits and alternatives for the proposed anesthesia with the patient or authorized representative who has indicated his/her understanding and acceptance.       Plan Discussed with:   Anesthesia Plan Comments:         Anesthesia Quick Evaluation

## 2021-07-10 NOTE — Discharge Summary (Signed)
Postpartum Discharge Summary    Patient Name: Michelle Thompson DOB: [birth year] MRN: [id number]  Date of admission: 07/09/2021 Delivery date:07/10/2021  Delivering provider: Arrie Senate  Date of discharge: 07/11/2021  Admitting diagnosis: Encounter for induction of labor [Z34.90] Intrauterine pregnancy: [redacted]w[redacted]d    Secondary diagnosis:  Active Problems:   Chronic hypertension affecting pregnancy   Anemia of pregnancy in third trimester   GBS (group B streptococcus) UTI complicating pregnancy   LGSIL on Pap smear of cervix   Alpha thalassemia silent carrier   Encounter for induction of labor   Vaginal delivery  Additional problems: none    Discharge diagnosis: Term Pregnancy Delivered, CHTN, and Anemia                                              Post partum procedures: NONE Augmentation: AROM and Pitocin Complications: None  Hospital course: Induction of Labor With Vaginal Delivery   25y.o. yo G3P2002 at 376w5das admitted to the hospital 07/09/2021 for induction of labor.  Indication for induction:  cHTN .  Patient had an uncomplicated labor course as follows: Membrane Rupture Time/Date: 12:45 PM ,07/10/2021   Delivery Method:Vaginal, Spontaneous  Episiotomy: None  Lacerations:  None  Details of delivery can be found in separate delivery note.  Patient had a routine postpartum course. Patient is discharged home 07/11/21.  Newborn Data: Birth date:07/10/2021  Birth time:4:00 PM  Gender:Female  Living status:Living  Apgars:9 ,9  Weight:2910 g   Magnesium Sulfate received: No BMZ received: No Rhophylac:N/A MMR:N/A T-DaP: declined Flu: declined  Transfusion:No  Physical exam  Vitals:   07/10/21 2312 07/11/21 0142 07/11/21 0619 07/11/21 1346  BP: 133/88 118/76 (!) 137/92 (!) 133/92  Pulse: 72 84 94 95  Resp: '18 18 18 16  ' Temp: 99 F (37.2 C) 97.9 F (36.6 C) 97.9 F (36.6 C) 97.6 F (36.4 C)  TempSrc: Oral Oral Oral Oral  SpO2: 99% 99% 99% 98%  Weight:       Height:       See progress note for PE   Labs: Lab Results  Component Value Date   WBC 9.3 07/10/2021   HGB 9.9 (L) 07/10/2021   HCT 32.4 (L) 07/10/2021   MCV 74.1 (L) 07/10/2021   PLT 144 (L) 07/10/2021   CMP Latest Ref Rng & Units 07/10/2021  Glucose 70 - 99 mg/dL 109(H)  BUN 6 - 20 mg/dL <5(L)  Creatinine 0.44 - 1.00 mg/dL 0.70  Sodium 135 - 145 mmol/L 135  Potassium 3.5 - 5.1 mmol/L 3.6  Chloride 98 - 111 mmol/L 107  CO2 22 - 32 mmol/L 23  Calcium 8.9 - 10.3 mg/dL 8.9  Total Protein 6.5 - 8.1 g/dL 5.8(L)  Total Bilirubin 0.3 - 1.2 mg/dL 0.5  Alkaline Phos 38 - 126 U/L 140(H)  AST 15 - 41 U/L 19  ALT 0 - 44 U/L 11   Edinburgh Score: Edinburgh Postnatal Depression Scale Screening Tool 07/11/2021  I have been able to laugh and see the funny side of things. 0  I have looked forward with enjoyment to things. 0  I have blamed myself unnecessarily when things went wrong. 0  I have been anxious or worried for no good reason. 0  I have felt scared or panicky for no good reason. 0  Things have been getting on top of  me. 0  I have been so unhappy that I have had difficulty sleeping. 0  I have felt sad or miserable. 0  I have been so unhappy that I have been crying. 0  The thought of harming myself has occurred to me. 0  Edinburgh Postnatal Depression Scale Total 0     After visit meds:  Allergies as of 07/11/2021       Reactions   Fentanyl Shortness Of Breath, Itching   7.11.2022 Pt reports that she was having anxiety and this was the reason for shortness of breath.        Medication List     STOP taking these medications    cyclobenzaprine 10 MG tablet Commonly known as: FLEXERIL   ferrous sulfate 325 (65 FE) MG tablet Commonly known as: FerrouSul   labetalol 100 MG tablet Commonly known as: NORMODYNE   prenatal multivitamin Tabs tablet   promethazine 25 MG tablet Commonly known as: PHENERGAN       TAKE these medications    acetaminophen 500 MG  tablet Commonly known as: TYLENOL Take 2 tablets (1,000 mg total) by mouth every 6 (six) hours as needed (for pain scale < 4).   coconut oil Oil Apply 1 application topically as needed.   ibuprofen 600 MG tablet Commonly known as: ADVIL Take 1 tablet (600 mg total) by mouth every 6 (six) hours.   NIFEdipine 30 MG 24 hr tablet Commonly known as: ADALAT CC Take 1 tablet (30 mg total) by mouth daily. Start taking on: July 12, 2021         Discharge home in stable condition Infant Feeding: Breast Infant Disposition:home with mother Discharge instruction: per After Visit Summary and Postpartum booklet. Activity: Advance as tolerated. Pelvic rest for 6 weeks.  Diet: routine diet Future Appointments: Future Appointments  Date Time Provider [place]  08/22/2021  9:15 AM Constant, Vickii Chafe, MD [place] None   Follow up Visit: Message sent to [facility] 07/10/21 by Sylvester Harder.   Please schedule this patient for a In person postpartum visit in 6 weeks with the following provider: Any provider. Additional Postpartum F/U:BP check 1 week  High risk pregnancy complicated by: HTN Delivery mode:  Vaginal, Spontaneous  Anticipated Birth Control:  Nexplanon OP   07/11/2021 Maury Dus, MD

## 2021-07-10 NOTE — Plan of Care (Signed)
Pt delivered

## 2021-07-11 ENCOUNTER — Ambulatory Visit: Payer: Managed Care, Other (non HMO)

## 2021-07-11 ENCOUNTER — Other Ambulatory Visit: Payer: Managed Care, Other (non HMO)

## 2021-07-11 MED ORDER — IBUPROFEN 600 MG PO TABS: 600.0000 mg | ORAL_TABLET | Freq: Four times a day (QID) | ORAL | 0 refills | Status: DC

## 2021-07-11 MED ORDER — NIFEDIPINE ER 30 MG PO TB24: 30.0000 mg | ORAL_TABLET | Freq: Every day | ORAL | 0 refills | Status: DC

## 2021-07-11 MED ORDER — COCONUT OIL OIL: 1.0000 "application " | TOPICAL_OIL | 0 refills | Status: DC | PRN

## 2021-07-11 MED ORDER — ACETAMINOPHEN 500 MG PO TABS: 1000.0000 mg | ORAL_TABLET | Freq: Four times a day (QID) | ORAL | 0 refills | Status: DC | PRN

## 2021-07-11 NOTE — Progress Notes (Signed)
Circumcision Consent  Discussed with mom at bedside about circumcision.   Circumcision is a surgery that removes the skin that covers the tip of the penis, called the "foreskin." Circumcision is usually done when a boy is between 1 and 10 days old, sometimes up to 3-4 weeks old.  The most common reasons boys are circumcised include for cultural/religious beliefs or for parental preference (potentially easier to clean, so baby looks like daddy, etc).  There may be some medical benefits for circumcision:   Circumcised boys seem to have slightly lower rates of: ? Urinary tract infections (per the American Academy of Pediatrics an uncircumcised boy has a 1/100 chance of developing a UTI in the first year of life, a circumcised boy at a 12/998 chance of developing a UTI in the first year of life- a 10% reduction) ? Penis cancer (typically rare- an uncircumcised female has a 1 in 100,000 chance of developing cancer of the penis) ? Sexually transmitted infection (in endemic areas, including HIV, HPV and Herpes- circumcision does NOT protect against gonorrhea, chlamydia, trachomatis, or syphilis) ? Phimosis: a condition where that makes retraction of the foreskin over the glans impossible (0.4 per 1000 boys per year or 0.6% of boys are affected by their 15th birthday)  Boys and men who are not circumcised can reduce these extra risks by: ? Cleaning their penis well ? Using condoms during sex  What are the risks of circumcision?  As with any surgical procedure, there are risks and complications. In circumcision, complications are rare and usually minor, the most common being: ? Bleeding- risk is reduced by holding each clamp for 30 seconds prior to a cut being made, and by holding pressure after the procedure is done ? Infection- the penis is cleaned prior to the procedure, and the procedure is done under sterile technique ? Damage to the urethra or amputation of the penis  How is circumcision done  in baby boys?  The baby will be placed on a special table and the legs restrained for their safety. Numbing medication is injected into the penis, and the skin is cleansed with betadine to decrease the risk of infection.   What to expect:  The penis will look red and raw for 5-7 days as it heals. We expect scabbing around where the cut was made, as well as clear-pink fluid and some swelling of the penis right after the procedure. If your baby's circumcision starts to bleed or develops pus, please contact your pediatrician immediately.  All questions were answered and mother consented.  Kendall Arnell Cresenzo-Dishmon Obstetrics Fellow  

## 2021-07-11 NOTE — Progress Notes (Addendum)
POSTPARTUM PROGRESS NOTE  Post Partum Day 1  Subjective:  Michelle Thompson is a 25 y.o. [id number] s/p VD at 4w6don 7/11 @ 1600     ROS General: Denies fevers, chills HEENT: Denies HA, lightheadedness, vision changes  Resp: Denies SOB Card: Denis chest pain, palpitations GI/GU: Pain well controlled, no dysuria, lochia appropriate Extremities: Denies leg swelling/calf pain Psych: mood stable   Objective: Blood pressure (!) 137/92, pulse 94, temperature 97.9 F (36.6 C), temperature source Oral, resp. rate 18, height '5\' 2"'  (1.575 m), weight 78 kg, last menstrual period 10/15/2020, SpO2 99 %, not currently breastfeeding.  Physical Exam:  General: alert, cooperative and no distress HEENT: AT/[place], MMM Chest: CTAB Heart: RRR Abdomen: soft, nontender, bowel sounds active  Uterine Fundus: firm, below level of umbilicus  DVT Evaluation: No calf swelling or tenderness Extremities: no edema Skin: warm, dry  Recent Labs    07/10/21 0017 07/10/21 1111  HGB 10.0* 9.9*  HCT 31.1* 32.4*    Assessment/Plan: Michelle Andalonis a 25y.o. [id number]/p VD at 372w6dn 7/11 @ 16--  PPD#1 - Doing well  Routine postpartum care  #cHTN -now on nifedipine 3036maily  --Bps still 130s/90s, watch today, may increase meds   Contraception: OP nexplanon Feeding: formula  Dispo: Plan for discharge => 7/13   LOS: 1 day   MirArmandina GemmaI PGY3 07/11/2021, 12:23 PM   I personally saw and evaluated the patient, performing the key elements of the service. I developed and verified the management plan that is described in the resident's/student's note, and I agree with the content with my edits above. VSS, HRR&R, Resp unlabored, Legs neg.  Michelle BertholdNM 07/13/2021 9:39 AM

## 2021-07-12 ENCOUNTER — Other Ambulatory Visit: Payer: Self-pay | Admitting: Family Medicine

## 2021-07-12 ENCOUNTER — Inpatient Hospital Stay (HOSPITAL_COMMUNITY): Payer: Managed Care, Other (non HMO)

## 2021-07-12 ENCOUNTER — Inpatient Hospital Stay (HOSPITAL_COMMUNITY)
Admission: AD | Admit: 2021-07-12 | Payer: Managed Care, Other (non HMO) | Source: Home / Self Care | Admitting: Obstetrics & Gynecology

## 2021-07-12 MED ORDER — NIFEDIPINE ER 30 MG PO TB24: 60.0000 mg | ORAL_TABLET | Freq: Every day | ORAL | 0 refills | Status: DC

## 2021-07-14 ENCOUNTER — Telehealth: Payer: Self-pay

## 2021-07-14 NOTE — Telephone Encounter (Signed)
Received message from appt desk pt called c/o breast engorgement and a fever. I called pt back. She said she has already spoke with the after hours nurse, a precription for Doxy was sent to the pharmacy, and she has already started taking it.

## 2021-07-17 ENCOUNTER — Other Ambulatory Visit: Payer: Managed Care, Other (non HMO)

## 2021-07-21 ENCOUNTER — Ambulatory Visit (INDEPENDENT_AMBULATORY_CARE_PROVIDER_SITE_OTHER): Payer: Managed Care, Other (non HMO)

## 2021-07-21 ENCOUNTER — Other Ambulatory Visit: Payer: Self-pay

## 2021-07-21 DIAGNOSIS — Z0130 Encounter for examination of blood pressure without abnormal findings: Secondary | ICD-10-CM

## 2021-07-21 NOTE — Progress Notes (Addendum)
Subjective:  Michelle Thompson is a 25 y.o. female here for BP check 1 Week PP.   Hypertension ROS: taking medications as instructed, no medication side effects noted, no TIA's, no chest pain on exertion, no dyspnea on exertion, and no swelling of ankles.    Objective:  BP 137/87   Pulse 84   LMP 10/15/2020   Breastfeeding No   Appearance alert, well appearing, and in no distress. General exam BP noted to be well controlled today in office.    Assessment:   Blood Pressure well controlled.   Plan:  Current treatment plan is effective, no change in therapy. RTO in 1 Week for BP check. Make PP Appt.Marland Kitchen

## 2021-07-21 NOTE — Progress Notes (Signed)
Agree with A & P. 

## 2021-07-22 ENCOUNTER — Telehealth (HOSPITAL_COMMUNITY): Payer: Self-pay

## 2021-07-22 NOTE — Telephone Encounter (Addendum)
"  I'm doing good." Patient declines questions or concerns about her healing. RN explained edinburgh postnatal depression scale screening and patient refuses screening at this time. Patient states "I am fine."  "He is doing good. We have been to the pediatrician and everything was good. He is sleeping in a crib." Patient declines questions or concerns about baby.   Marcelino Duster [facility] 07/22/2021,1046

## 2021-07-24 ENCOUNTER — Encounter: Payer: Self-pay | Admitting: Obstetrics and Gynecology

## 2021-07-24 ENCOUNTER — Telehealth (INDEPENDENT_AMBULATORY_CARE_PROVIDER_SITE_OTHER): Payer: Managed Care, Other (non HMO) | Admitting: Obstetrics and Gynecology

## 2021-07-24 ENCOUNTER — Other Ambulatory Visit: Payer: Self-pay

## 2021-07-24 VITALS — BP 129/81

## 2021-07-24 DIAGNOSIS — Z7689 Persons encountering health services in other specified circumstances: Secondary | ICD-10-CM

## 2021-07-24 NOTE — Progress Notes (Signed)
Pt would like to discuss going back to work. Pt states BP readings at home have been stable. Denies HA, blurred vision, or floaters.

## 2021-07-24 NOTE — Progress Notes (Signed)
   Provider location: [facility] for [facility]   Patient location: Home  I connected withNAME@ on 07/24/21 at  2:00 PM EDT by Mychart Video Encounter and verified that I am speaking with the correct person using two identifiers.       I discussed the limitations, risks, security and privacy concerns of performing an evaluation and management service virtually and the availability of in person appointments. I also discussed with the patient that there may be a patient responsible charge related to this service. The patient expressed understanding and agreed to proceed.  Post Partum Visit Note Subjective:   Michelle Thompson is a 25 y.o. G89P2002 female who presents for a postpartum visit. She is 2 weeks postpartum following a normal spontaneous vaginal delivery.  I have fully reviewed the prenatal and intrapartum course. The delivery was at 37.5 gestational weeks.  Anesthesia: none. Postpartum course has been uncomplicated. Baby is doing well. Baby is feeding by bottle - Similac Advance. Bleeding moderate lochia. Bowel function is normal. Bladder function is normal. Patient is not sexually active. Contraception method is abstinence. Postpartum depression screening: negative. Patient is contacting the office as she desires to return to work once a week and desires clearance note   The pregnancy intention screening data noted above was reviewed. Potential methods of contraception were discussed. The patient elected to proceed with No data recorded.       Review of Systems Pertinent items noted in HPI and remainder of comprehensive ROS otherwise negative.  Objective:  BP 129/81   LMP 10/15/2020   Breastfeeding No     General:  Alert, oriented and cooperative. Patient is in no acute distress.  Respiratory: Normal respiratory effort, no problems with respiration noted  Mental Status: Normal mood and affect. Normal behavior. Normal judgment and thought content.  Rest of physical  exam deferred due to type of encounter   Assessment:     postpartum exam.  Plan:  Essential components of care per ACOG recommendations:  1. Patient is cleared to return to work once a week Continue taking BP medication Follow up as scheduled for pp visit on 8/9  I provided 15 minutes of face-to-face time during this encounter.    No follow-ups on file.  Future Appointments  Date Time Provider Department Center  08/08/2021 10:55 AM Brock Bad, MD [facility] None    Catalina Antigua, MD Center for [facility], [facility]

## 2021-08-01 IMAGING — US US OB LIMITED
1 series · 5 of 5 positions shown · non-contrast
Comparison: none

[Series 1: us ob limited · 5 of 5 slices shown]
[im 1/5]
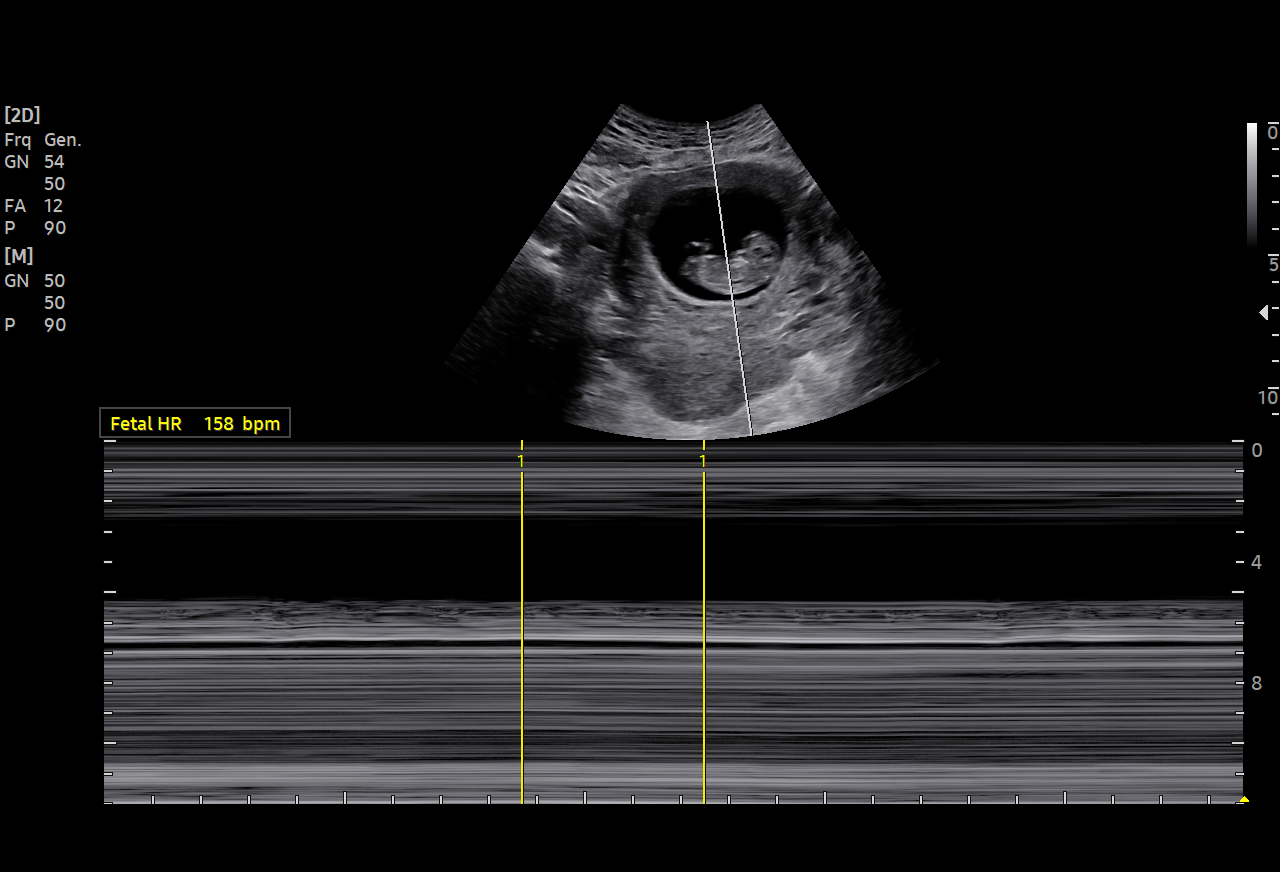
[im 2/5]
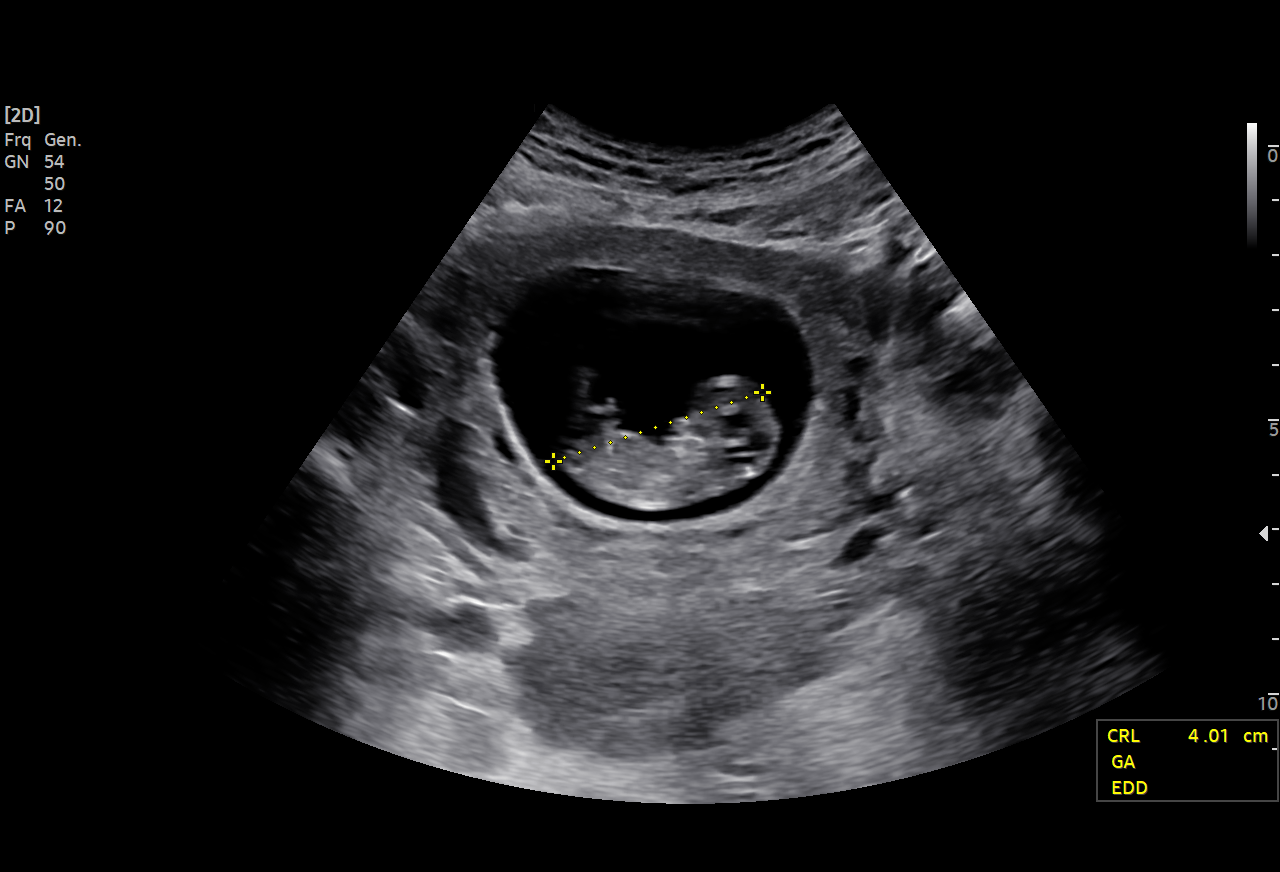
[im 3/5]
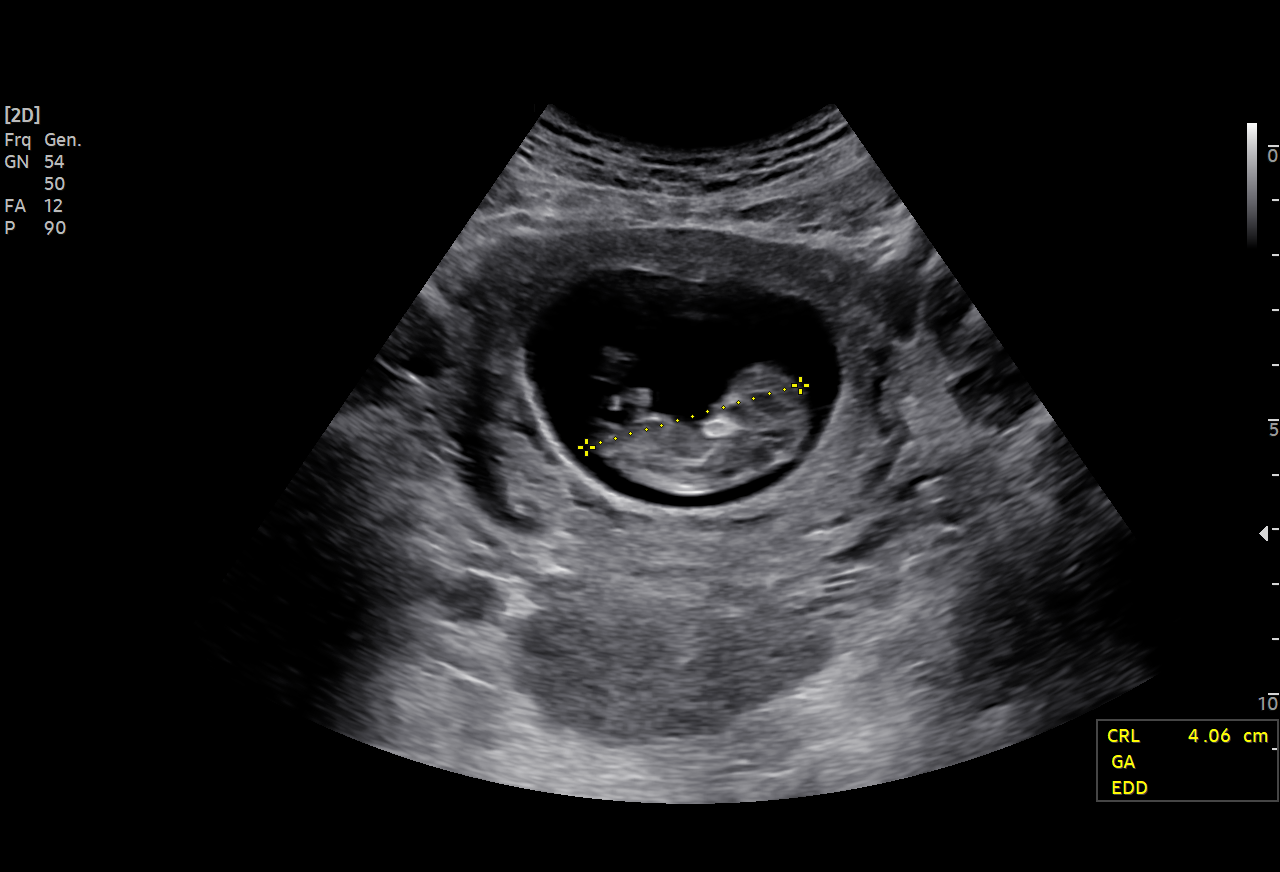
[im 4/5]
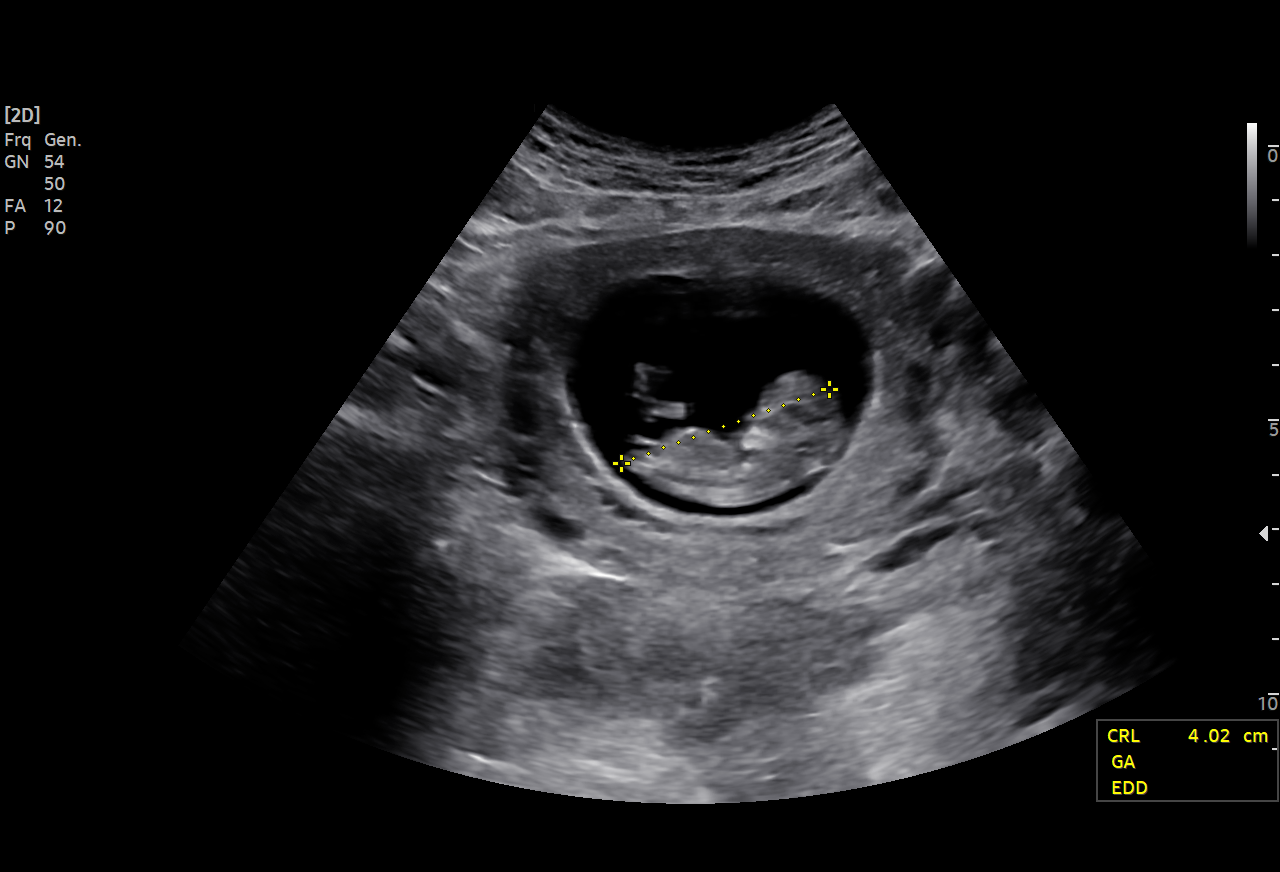
[im 5/5]
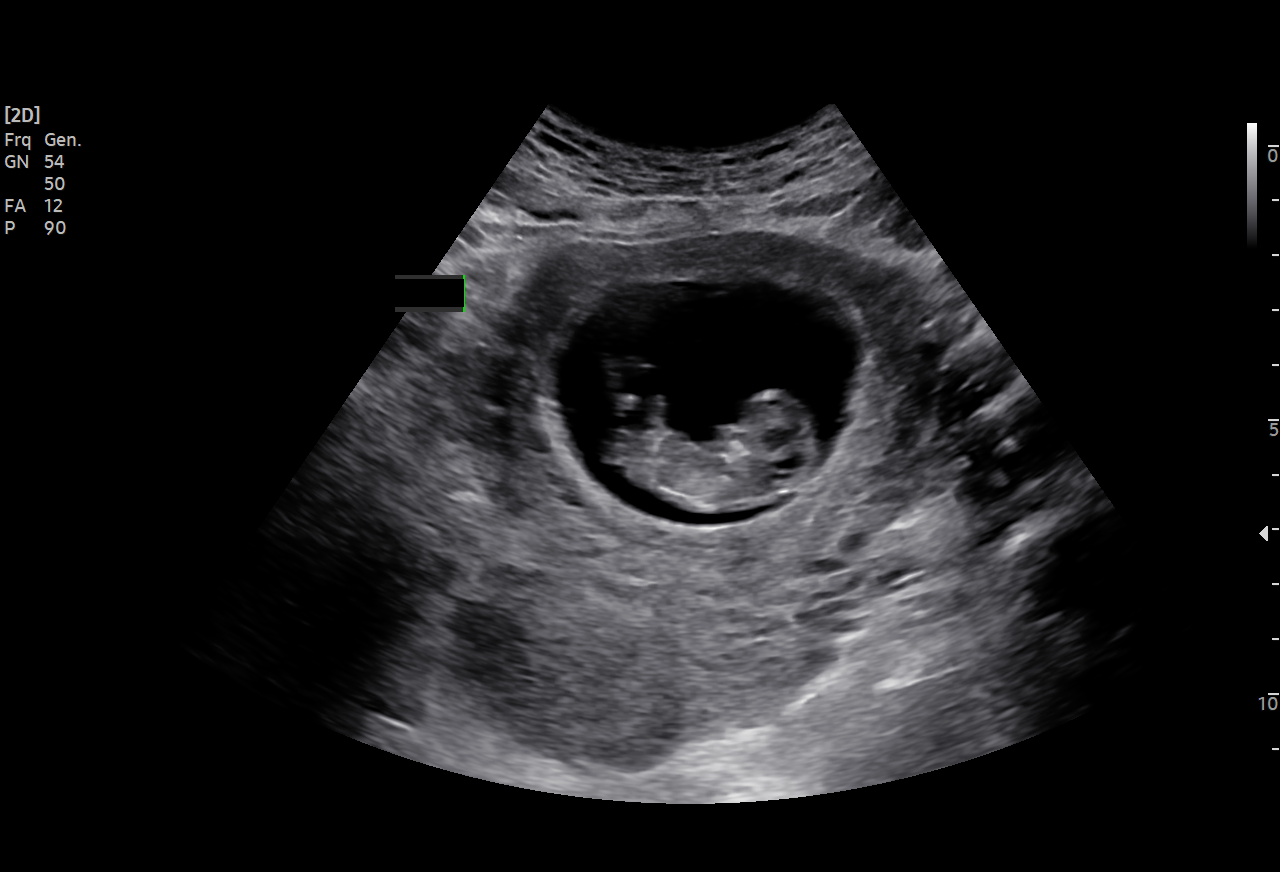

[5 of 5 positions shown; findings below may reference images not displayed]

Healthcare

 1   [HOSPITAL]                        76815.0      FRANCESCU TALBI

Indications

 10 weeks gestation of pregnancy
Fetal Evaluation

 Num Of Fetuses:          1
 Fetal Heart Rate(bpm):   158
 Cardiac Activity:        Observed
Biometry

 CRL:      40.3   mm     G. Age:  10w 5d                   EDD:   07/26/21
Gestational Age

 Best:           10w 5d    Det. By:  10 weeks gestation of      EDD:  07/26/21
                                     pregnancy
Comments

 Single live IUP at 07w8d by CRL. FHR 158
Impression

 Viable intrauterine pregnancy
Recommendations

 Routine prenatal care
                 [place], [place]

## 2021-08-08 ENCOUNTER — Encounter: Payer: Self-pay | Admitting: Obstetrics

## 2021-08-08 ENCOUNTER — Ambulatory Visit (INDEPENDENT_AMBULATORY_CARE_PROVIDER_SITE_OTHER): Payer: Managed Care, Other (non HMO) | Admitting: Obstetrics

## 2021-08-08 ENCOUNTER — Other Ambulatory Visit: Payer: Self-pay

## 2021-08-08 DIAGNOSIS — Z3009 Encounter for other general counseling and advice on contraception: Secondary | ICD-10-CM | POA: Diagnosis not present

## 2021-08-08 DIAGNOSIS — Z30013 Encounter for initial prescription of injectable contraceptive: Secondary | ICD-10-CM

## 2021-08-08 LAB — POCT URINE PREGNANCY: Preg Test, Ur: NEGATIVE

## 2021-08-08 MED ORDER — MEDROXYPROGESTERONE ACETATE 150 MG/ML IM SUSP: 150.0000 mg | INTRAMUSCULAR | 4 refills | Status: DC

## 2021-08-08 MED ORDER — MEDROXYPROGESTERONE ACETATE 150 MG/ML IM SUSP: 150.0000 mg | Freq: Once | INTRAMUSCULAR | Status: DC

## 2021-08-08 MED ORDER — MEDROXYPROGESTERONE ACETATE 150 MG/ML IM SUSP
150.0000 mg | Freq: Once | INTRAMUSCULAR | Status: AC
  Administered 2021-08-08: 150 mg via INTRAMUSCULAR

## 2021-08-08 NOTE — Progress Notes (Addendum)
Post Partum Visit Note  Michelle Thompson is a 25 y.o. G52P2002 female who presents for a postpartum visit. She is 4weeks postpartum following a normal spontaneous vaginal delivery.  I have fully reviewed the prenatal and intrapartum course. The delivery was at 37 gestational weeks.  Anesthesia: none. Postpartum course has been uncomplicated. Baby is doing well. Baby is feeding by bottle - Similac Advance. Bleeding no bleeding. Bowel function is normal. Bladder function is normal. Patient is not sexually active. Contraception method is Nexplanon. Postpartum depression screening: negative.   The pregnancy intention screening data noted above was reviewed. Potential methods of contraception were discussed. The patient elected to proceed with No data recorded.   Edinburgh Postnatal Depression Scale - 08/08/21 1138       Edinburgh Postnatal Depression Scale:  In the Past 7 Days   I have been able to laugh and see the funny side of things. 0    I have looked forward with enjoyment to things. 0    I have blamed myself unnecessarily when things went wrong. 0    I have been anxious or worried for no good reason. 0    I have felt scared or panicky for no good reason. 0    Things have been getting on top of me. 0    I have been so unhappy that I have had difficulty sleeping. 0    I have felt sad or miserable. 0    I have been so unhappy that I have been crying. 0    The thought of harming myself has occurred to me. 0    Edinburgh Postnatal Depression Scale Total 0             Health Maintenance Due  Topic Date Due   COVID-19 Vaccine (1) Never done   HPV VACCINES (1 - 2-dose series) Never done   INFLUENZA VACCINE  07/31/2021    The following portions of the patient's history were reviewed and updated as appropriate: allergies, current medications, past family history, past medical history, past social history, past surgical history, and problem list.  Review of Systems A comprehensive  review of systems was negative.  Objective:  BP 124/80   Pulse 76   Wt 145 lb (65.8 kg)   LMP 10/15/2020   BMI 26.52 kg/m    General:  alert and no distress   Breasts:  normal  Lungs: clear to auscultation bilaterally  Heart:  regular rate and rhythm, S1, S2 normal, no murmur, click, rub or gallop  Abdomen: soft, non-tender; bowel sounds normal; no masses,  no organomegaly   Wound None  GU exam:     Not indicated       Assessment:    1. Postpartum care following vaginal delivery  2. Encounter for other general counseling and advice on contraception - wants Depo Provera  3. Encounter for initial prescription of injectable contraceptive Rx: - medroxyPROGESTERone (DEPO-PROVERA) injection 150 mg - medroxyPROGESTERone (DEPO-PROVERA) 150 MG/ML injection; Inject 1 mL (150 mg total) into the muscle every 3 (three) months.  Dispense: 1 mL; Refill: 4    Plan:   Essential components of care per ACOG recommendations:  1.  Mood and well being: Patient with negative depression screening today. Reviewed local resources for support.  - Patient tobacco use? No.   - hx of drug use? No.    2. Infant care and feeding:  -Patient currently breastmilk feeding? No.  -Social determinants of health (SDOH) reviewed in EPIC. No  concerns  3. Sexuality, contraception and birth spacing - Patient does not want a pregnancy in the next year.  Desired family size is 2 children.  - Reviewed forms of contraception in tiered fashion. Patient desired Depo-Provera today.   - Discussed birth spacing of 18 months  4. Sleep and fatigue -Encouraged family/partner/community support of 4 hrs of uninterrupted sleep to help with mood and fatigue  5. Physical Recovery  - Discussed patients delivery and complications. She describes her labor as good. - Patient had a Vaginal, no problems at delivery. Patient had  no  lacerations.  - Patient has urinary incontinence? No. - Patient is safe to resume physical and  sexual activity  6.  Health Maintenance - HM due items addressed Yes - Last pap smear  Diagnosis  Date Value Ref Range Status  01/09/2021 - Low grade squamous intraepithelial lesion (LSIL) (A)  Final   Pap smear not done at today's visit.  -Breast Cancer screening indicated? No 7. Chronic Disease/Pregnancy Condition follow up: None  - PCP follow up  Coral Ceo, MD [facility] for [facility], [facility]  09/27/21

## 2021-08-08 NOTE — Addendum Note (Signed)
Addended by: Cheree Ditto, Cray Monnin A on: 08/08/2021 01:27 PM   Modules accepted: Orders

## 2021-08-20 ENCOUNTER — Encounter: Payer: Self-pay | Admitting: Emergency Medicine

## 2021-08-20 ENCOUNTER — Other Ambulatory Visit: Payer: Self-pay

## 2021-08-20 ENCOUNTER — Ambulatory Visit
Admission: EM | Admit: 2021-08-20 | Discharge: 2021-08-20 | Disposition: A | Discharge status: Home / Self Care | Payer: Managed Care, Other (non HMO) | Attending: Urgent Care | Admitting: Urgent Care

## 2021-08-20 DIAGNOSIS — Z793 Long term (current) use of hormonal contraceptives: Secondary | ICD-10-CM | POA: Diagnosis not present

## 2021-08-20 DIAGNOSIS — Z202 Contact with and (suspected) exposure to infections with a predominantly sexual mode of transmission: Secondary | ICD-10-CM

## 2021-08-20 DIAGNOSIS — O8613 Vaginitis following delivery: Secondary | ICD-10-CM | POA: Diagnosis not present

## 2021-08-20 DIAGNOSIS — R102 Pelvic and perineal pain: Secondary | ICD-10-CM | POA: Diagnosis present

## 2021-08-20 DIAGNOSIS — N898 Other specified noninflammatory disorders of vagina: Secondary | ICD-10-CM

## 2021-08-20 DIAGNOSIS — Z8759 Personal history of other complications of pregnancy, childbirth and the puerperium: Secondary | ICD-10-CM | POA: Diagnosis not present

## 2021-08-20 DIAGNOSIS — B9689 Other specified bacterial agents as the cause of diseases classified elsewhere: Secondary | ICD-10-CM | POA: Diagnosis not present

## 2021-08-20 DIAGNOSIS — Z7289 Other problems related to lifestyle: Secondary | ICD-10-CM | POA: Insufficient documentation

## 2021-08-20 LAB — POCT URINALYSIS DIP (MANUAL ENTRY)
Bilirubin, UA: NEGATIVE
Glucose, UA: NEGATIVE mg/dL
Ketones, POC UA: NEGATIVE mg/dL
Leukocytes, UA: NEGATIVE
Nitrite, UA: NEGATIVE
Protein Ur, POC: 30 mg/dL — AB
Spec Grav, UA: >=1.03 — AB (ref 1.010–1.025)
Urobilinogen, UA: 0.2 E.U./dL
pH, UA: 5.5 (ref 5.0–8.0)

## 2021-08-20 LAB — POCT URINE PREGNANCY: Preg Test, Ur: NEGATIVE

## 2021-08-20 MED ORDER — NAPROXEN 500 MG PO TABS: 500.0000 mg | ORAL_TABLET | Freq: Two times a day (BID) | ORAL | 0 refills | Status: DC

## 2021-08-20 NOTE — ED Triage Notes (Signed)
Pt here for STD testing and some burning and irritation; pt is 6 weeks post partum

## 2021-08-20 NOTE — ED Provider Notes (Addendum)
[facility]   MRN: [id number] DOB: 1996-10-06  Subjective:   Michelle Thompson is a 25 y.o. female 6 weeks postpartum presenting for 2 day of acute onset vaginal bleeding, pelvic pain, vaginal irritation.  Symptoms started after she had sex with her fianc.  She is concerned that she has a sexually transmitted infection from him, believes that he has not been monogamous.  Denies fever, nausea, vomiting, abdominal pain, dysuria, flank pain.  Denies history of fibroids, ovarian cyst.  She does have follow-up soon with her obstetrician.  Has not used medications for pain relief.  No current facility-administered medications for this encounter.  Current Outpatient Medications:    acetaminophen (TYLENOL) 500 MG tablet, Take 2 tablets (1,000 mg total) by mouth every 6 (six) hours as needed (for pain scale < 4). (Patient not taking: No sig reported), Disp: 30 tablet, Rfl: 0   coconut oil OIL, Apply 1 application topically as needed. (Patient not taking: No sig reported), Disp: , Rfl: 0   ibuprofen (ADVIL) 600 MG tablet, Take 1 tablet (600 mg total) by mouth every 6 (six) hours. (Patient not taking: No sig reported), Disp: 30 tablet, Rfl: 0   medroxyPROGESTERone (DEPO-PROVERA) 150 MG/ML injection, Inject 1 mL (150 mg total) into the muscle every 3 (three) months., Disp: 1 mL, Rfl: 4   NIFEdipine (ADALAT CC) 30 MG 24 hr tablet, Take 2 tablets (60 mg total) by mouth daily., Disp: 90 tablet, Rfl: 0   Allergies  Allergen Reactions   Fentanyl Shortness Of Breath and Itching    7.11.2022 Pt reports that she was having anxiety and this was the reason for shortness of breath.    Past Medical History:  Diagnosis Date   Complication of anesthesia    fentanyl itching   Hypertension    Gestational Hypertension     Past Surgical History:  Procedure Laterality Date   NO PAST SURGERIES      Family History  Problem Relation Age of Onset   Lupus Mother    Healthy Father    Asthma Brother     Diabetes Maternal Grandmother    COPD Maternal Grandmother    Cancer Maternal Grandmother    Diabetes Maternal Grandfather    Stroke Maternal Grandfather     Social History   Tobacco Use   Smoking status: Never   Smokeless tobacco: Never  Vaping Use   Vaping Use: Never used  Substance Use Topics   Alcohol use: No   Drug use: No    ROS   Objective:   Vitals: BP (!) 146/90 (BP Location: Left Arm)   Pulse 94   Temp 98.3 F (36.8 C) (Oral)   Resp 18   LMP 10/15/2020   SpO2 99%   BP Readings from Last 3 Encounters:  08/20/21 (!) 146/90  08/08/21 124/80  07/24/21 129/81   Physical Exam Constitutional:      General: She is not in acute distress.    Appearance: Normal appearance. She is well-developed. She is not ill-appearing, toxic-appearing or diaphoretic.  HENT:     Head: Normocephalic and atraumatic.     Nose: Nose normal.     Mouth/Throat:     Mouth: Mucous membranes are moist.     Pharynx: Oropharynx is clear.  Eyes:     General: No scleral icterus.       Right eye: No discharge.        Left eye: No discharge.     Extraocular Movements: Extraocular movements intact.  Conjunctiva/sclera: Conjunctivae normal.     Pupils: Pupils are equal, round, and reactive to light.  Cardiovascular:     Rate and Rhythm: Normal rate.  Pulmonary:     Effort: Pulmonary effort is normal.  Abdominal:     General: Bowel sounds are normal. There is no distension.     Palpations: Abdomen is soft. There is no mass.     Tenderness: There is no abdominal tenderness. There is no right CVA tenderness, left CVA tenderness, guarding or rebound.  Skin:    General: Skin is warm and dry.  Neurological:     General: No focal deficit present.     Mental Status: She is alert and oriented to person, place, and time.  Psychiatric:        Mood and Affect: Mood normal.        Behavior: Behavior normal.        Thought Content: Thought content normal.        Judgment: Judgment  normal.    Results for orders placed or performed during the hospital encounter of 08/20/21 (from the past 24 hour(s))  POCT urinalysis dipstick     Status: Abnormal   Collection Time: 08/20/21  8:42 AM  Result Value Ref Range   Color, UA yellow yellow   Clarity, UA clear clear   Glucose, UA negative negative mg/dL   Bilirubin, UA negative negative   Ketones, POC UA negative negative mg/dL   Spec Grav, UA >=5.027 (A) 1.010 - 1.025   Blood, UA moderate (A) negative   pH, UA 5.5 5.0 - 8.0   Protein Ur, POC =30 (A) negative mg/dL   Urobilinogen, UA 0.2 0.2 or 1.0 E.U./dL   Nitrite, UA Negative Negative   Leukocytes, UA Negative Negative  POCT urine pregnancy     Status: None   Collection Time: 08/20/21  8:42 AM  Result Value Ref Range   Preg Test, Ur Negative Negative    Assessment and Plan :   PDMP not reviewed this encounter.  1. Pelvic pain   2. Possible exposure to STD   3. Vaginal irritation     We will base treatment off of lab results.  Patient is not breast-feeding, recommended naproxen for pain.  No signs of urinary tract infection but I did recommend patient hydrate much better.  Low suspicion for PID.  Counseled patient on potential for adverse effects with medications prescribed/recommended today, ER and return-to-clinic precautions discussed, patient verbalized understanding.     Wallis Bamberg, [place] 08/20/21 [phone]

## 2021-08-21 LAB — CERVICOVAGINAL ANCILLARY ONLY
Bacterial Vaginitis (gardnerella): POSITIVE — AB
Candida Glabrata: NEGATIVE
Candida Vaginitis: NEGATIVE
Chlamydia: NEGATIVE
Comment: NEGATIVE
Comment: NEGATIVE
Comment: NEGATIVE
Comment: NEGATIVE
Comment: NEGATIVE
Comment: NORMAL
Neisseria Gonorrhea: NEGATIVE
Trichomonas: NEGATIVE

## 2021-08-22 ENCOUNTER — Telehealth (HOSPITAL_COMMUNITY): Payer: Self-pay | Admitting: Emergency Medicine

## 2021-08-22 ENCOUNTER — Ambulatory Visit: Payer: Managed Care, Other (non HMO) | Admitting: Obstetrics and Gynecology

## 2021-08-22 MED ORDER — METRONIDAZOLE 500 MG PO TABS: 500.0000 mg | ORAL_TABLET | Freq: Two times a day (BID) | ORAL | 0 refills | Status: DC

## 2021-08-24 ENCOUNTER — Inpatient Hospital Stay (HOSPITAL_COMMUNITY)
Admission: AD | Admit: 2021-08-24 | Discharge: 2021-08-24 | Disposition: A | Discharge status: Home / Self Care | Payer: Managed Care, Other (non HMO) | Attending: Obstetrics and Gynecology | Admitting: Obstetrics and Gynecology

## 2021-08-24 ENCOUNTER — Other Ambulatory Visit: Payer: Self-pay

## 2021-08-24 DIAGNOSIS — R300 Dysuria: Secondary | ICD-10-CM

## 2021-08-24 DIAGNOSIS — I10 Essential (primary) hypertension: Secondary | ICD-10-CM | POA: Diagnosis not present

## 2021-08-24 DIAGNOSIS — N760 Acute vaginitis: Secondary | ICD-10-CM | POA: Insufficient documentation

## 2021-08-24 DIAGNOSIS — B9689 Other specified bacterial agents as the cause of diseases classified elsewhere: Secondary | ICD-10-CM | POA: Diagnosis not present

## 2021-08-24 DIAGNOSIS — Z885 Allergy status to narcotic agent status: Secondary | ICD-10-CM | POA: Diagnosis not present

## 2021-08-24 DIAGNOSIS — R102 Pelvic and perineal pain: Secondary | ICD-10-CM | POA: Diagnosis present

## 2021-08-24 DIAGNOSIS — R21 Rash and other nonspecific skin eruption: Secondary | ICD-10-CM | POA: Diagnosis not present

## 2021-08-24 DIAGNOSIS — Z793 Long term (current) use of hormonal contraceptives: Secondary | ICD-10-CM | POA: Diagnosis not present

## 2021-08-24 LAB — WET PREP, GENITAL
Clue Cells Wet Prep HPF POC: NONE SEEN
Sperm: NONE SEEN
Trich, Wet Prep: NONE SEEN
Yeast Wet Prep HPF POC: NONE SEEN

## 2021-08-24 MED ORDER — NITROFURANTOIN MONOHYD MACRO 100 MG PO CAPS: 100.0000 mg | ORAL_CAPSULE | Freq: Two times a day (BID) | ORAL | 0 refills | Status: AC

## 2021-08-24 MED ORDER — FLUCONAZOLE 150 MG PO TABS: 150.0000 mg | ORAL_TABLET | Freq: Once | ORAL | 0 refills | Status: AC

## 2021-08-24 NOTE — MAU Provider Note (Signed)
History     [id number]  Arrival date and time: 08/24/21 1809    Chief Complaint  Patient presents with   Vaginal Pain     HPI Michelle Thompson is a 25 y.o. s/p NSVD on 07/10/21, who presents for vaginal pain.    Patient seen in urgent care four days prior for STI testing due to vaginal, swab showed only BV and negative for trich/GC/CT/yeast.  Reports she started taking flagyl two days prior in the evening Since that time partner has admitted to being unfaithful and she is concerned something may be wrong Current symptoms are a burning pain in her vagina at all times, exacerbated when she wipes after using the bathroom Also having some burning and pain with urination No vaginal discharge No fever No nausea or vomiting Has not taken anything besides flagyl for discomfort Does shave her genitals Has looked with a mirror and not seen any rash   --/--/O POS (07/11 0320)  OB History     Gravida  3   Para  2   Term  2   Preterm      AB      Living  2      SAB      IAB      Ectopic      Multiple  0   Live Births  2           Past Medical History:  Diagnosis Date   Complication of anesthesia    fentanyl itching   Hypertension    Gestational Hypertension    Past Surgical History:  Procedure Laterality Date   NO PAST SURGERIES      Family History  Problem Relation Age of Onset   Lupus Mother    Healthy Father    Asthma Brother    Diabetes Maternal Grandmother    COPD Maternal Grandmother    Cancer Maternal Grandmother    Diabetes Maternal Grandfather    Stroke Maternal Grandfather     Social History   Socioeconomic History   Marital status: Single    Spouse name: Not on file   Number of children: Not on file   Years of education: Not on file   Highest education level: Not on file  Occupational History   Not on file  Tobacco Use   Smoking status: Never   Smokeless tobacco: Never  Vaping Use   Vaping Use: Never used  Substance  and Sexual Activity   Alcohol use: No   Drug use: No   Sexual activity: Yes    Partners: Male    Birth control/protection: None  Other Topics Concern   Not on file  Social History Narrative   Not on file   Social Determinants of Health   Financial Resource Strain: Not on file  Food Insecurity: No Food Insecurity   Worried About Running Out of Food in the Last Year: Never true   Ran Out of Food in the Last Year: Never true  Transportation Needs: No Transportation Needs   Lack of Transportation (Medical): No   Lack of Transportation (Non-Medical): No  Physical Activity: Not on file  Stress: Not on file  Social Connections: Not on file  Intimate Partner Violence: Not on file    Allergies  Allergen Reactions   Fentanyl Shortness Of Breath and Itching    7.11.2022 Pt reports that she was having anxiety and this was the reason for shortness of breath.    No current facility-administered medications on  file prior to encounter.   Current Outpatient Medications on File Prior to Encounter  Medication Sig Dispense Refill   acetaminophen (TYLENOL) 500 MG tablet Take 2 tablets (1,000 mg total) by mouth every 6 (six) hours as needed (for pain scale < 4). (Patient not taking: No sig reported) 30 tablet 0   coconut oil OIL Apply 1 application topically as needed. (Patient not taking: No sig reported)  0   ibuprofen (ADVIL) 600 MG tablet Take 1 tablet (600 mg total) by mouth every 6 (six) hours. (Patient not taking: No sig reported) 30 tablet 0   medroxyPROGESTERone (DEPO-PROVERA) 150 MG/ML injection Inject 1 mL (150 mg total) into the muscle every 3 (three) months. 1 mL 4   metroNIDAZOLE (FLAGYL) 500 MG tablet Take 1 tablet (500 mg total) by mouth 2 (two) times daily. 14 tablet 0   naproxen (NAPROSYN) 500 MG tablet Take 1 tablet (500 mg total) by mouth 2 (two) times daily with a meal. 30 tablet 0   NIFEdipine (ADALAT CC) 30 MG 24 hr tablet Take 2 tablets (60 mg total) by mouth daily. 90  tablet 0   [DISCONTINUED] Hyoscyamine Sulfate SL (LEVSIN/SL) 0.125 MG SUBL Place 1 tablet under the tongue every 12 (twelve) hours as needed (abdominal cramping). 10 tablet 0     ROS Pertinent positives and negative per HPI, all others reviewed and negative  Physical Exam   BP (!) 137/94 (BP Location: Right Arm)   Pulse (!) 115   Temp 98 F (36.7 C) (Oral)   Resp 20   Ht 5\' 2"  (1.575 m)   Wt 66.4 kg   SpO2 99%   BMI 26.76 kg/m   Patient Vitals for the past 24 hrs:  BP Temp Temp src Pulse Resp SpO2 Height Weight  08/24/21 1839 (!) 137/94 98 F (36.7 C) Oral (!) 115 20 99 % -- --  08/24/21 1833 -- -- -- -- -- -- 5\' 2"  (1.575 m) 66.4 kg    Physical Exam Constitutional:      General: She is not in acute distress.    Appearance: Normal appearance.  HENT:     Head: Normocephalic and atraumatic.  Eyes:     General: No scleral icterus. Pulmonary:     Effort: Pulmonary effort is normal. No respiratory distress.  Skin:    General: Skin is warm and dry.  Neurological:     General: No focal deficit present.     Mental Status: She is alert.     Gait: Gait normal.  Psychiatric:        Mood and Affect: Mood normal.        Behavior: Behavior normal.     Cervical Exam     Labs No results found for this or any previous visit (from the past 24 hour(s)).  Imaging No results found.  MAU Course  Procedures Lab Orders         Urine Culture    Meds ordered this encounter  Medications   fluconazole (DIFLUCAN) 150 MG tablet    Sig: Take 1 tablet (150 mg total) by mouth once for 1 dose.    Dispense:  1 tablet    Refill:  0   nitrofurantoin, macrocrystal-monohydrate, (MACROBID) 100 MG capsule    Sig: Take 1 capsule (100 mg total) by mouth 2 (two) times daily for 7 days.    Dispense:  14 capsule    Refill:  0   Imaging Orders  No imaging studies ordered today  MDM mild  Assessment and Plan  #Vaginal pain #Dysuria Offered patient repeat swab for peace of mind,  she accepts. Some symptoms more c/w yeast infection, possible this has developed as well, rx sent for fluconazole. Also with urinary symptoms, urine culture obtained and empiric treatment with macrobid sent. Also possible she has a herpes prodrome, discussed she should be seen for confirmatory testing in the office (normally goes to [facility]). May also be related to shaving. Will follow up with patient on results via mychart. Given she is >6 weeks post partum advised her to seek care at urgent care or main ED in the future.   Discharged to home in stable condition.   Venora Maples, MD/MPH 08/24/21 7:50 PM  Allergies as of 08/24/2021       Reactions   Fentanyl Shortness Of Breath, Itching   7.11.2022 Pt reports that she was having anxiety and this was the reason for shortness of breath.        Medication List     TAKE these medications    acetaminophen 500 MG tablet Commonly known as: TYLENOL Take 2 tablets (1,000 mg total) by mouth every 6 (six) hours as needed (for pain scale < 4).   coconut oil Oil Apply 1 application topically as needed.   fluconazole 150 MG tablet Commonly known as: DIFLUCAN Take 1 tablet (150 mg total) by mouth once for 1 dose.   ibuprofen 600 MG tablet Commonly known as: ADVIL Take 1 tablet (600 mg total) by mouth every 6 (six) hours.   medroxyPROGESTERone 150 MG/ML injection Commonly known as: DEPO-PROVERA Inject 1 mL (150 mg total) into the muscle every 3 (three) months.   metroNIDAZOLE 500 MG tablet Commonly known as: FLAGYL Take 1 tablet (500 mg total) by mouth 2 (two) times daily.   naproxen 500 MG tablet Commonly known as: NAPROSYN Take 1 tablet (500 mg total) by mouth 2 (two) times daily with a meal.   NIFEdipine 30 MG 24 hr tablet Commonly known as: ADALAT CC Take 2 tablets (60 mg total) by mouth daily.   nitrofurantoin (macrocrystal-monohydrate) 100 MG capsule Commonly known as: Macrobid Take 1 capsule (100 mg total) by  mouth 2 (two) times daily for 7 days.

## 2021-08-24 NOTE — MAU Note (Signed)
Presents with c/o vaginal pain.  Reports was seen in Urgent Care on Sunday for vaginal pain and diagnosed with BV.  Reports taking Flagyl and Naproxen.  Reports pain has worsened. States had STI testing on Sunday d/t fiance unfaithful.

## 2021-08-25 LAB — CERVICOVAGINAL ANCILLARY ONLY
Bacterial Vaginitis (gardnerella): POSITIVE — AB
Candida Glabrata: NEGATIVE
Candida Vaginitis: NEGATIVE
Chlamydia: NEGATIVE
Comment: NEGATIVE
Comment: NEGATIVE
Comment: NEGATIVE
Comment: NEGATIVE
Comment: NEGATIVE
Comment: NORMAL
Neisseria Gonorrhea: NEGATIVE
Trichomonas: NEGATIVE

## 2021-08-27 LAB — URINE CULTURE: Culture: 80000 — AB

## 2021-08-28 ENCOUNTER — Other Ambulatory Visit: Payer: Self-pay | Admitting: Family Medicine

## 2021-08-28 MED ORDER — AMOXICILLIN-POT CLAVULANATE 875-125 MG PO TABS: 1.0000 | ORAL_TABLET | Freq: Two times a day (BID) | ORAL | 0 refills | Status: AC

## 2021-08-30 ENCOUNTER — Ambulatory Visit: Payer: Managed Care, Other (non HMO) | Admitting: Obstetrics

## 2021-09-06 ENCOUNTER — Ambulatory Visit: Payer: Managed Care, Other (non HMO) | Admitting: Obstetrics

## 2021-09-07 ENCOUNTER — Ambulatory Visit: Payer: Managed Care, Other (non HMO) | Admitting: Obstetrics

## 2021-09-11 ENCOUNTER — Other Ambulatory Visit: Payer: Self-pay

## 2021-09-11 DIAGNOSIS — B009 Herpesviral infection, unspecified: Secondary | ICD-10-CM

## 2021-09-11 MED ORDER — VALACYCLOVIR HCL 1 G PO TABS: 1000.0000 mg | ORAL_TABLET | Freq: Two times a day (BID) | ORAL | 0 refills | Status: AC

## 2021-09-26 ENCOUNTER — Ambulatory Visit: Payer: Managed Care, Other (non HMO) | Admitting: Advanced Practice Midwife

## 2021-09-26 NOTE — Progress Notes (Deleted)
   GYNECOLOGY PROGRESS NOTE  History:  25 y.o. [id number] presents to [facility] *** office today for problem gyn visit. She reports *****.  She denies h/a, dizziness, shortness of breath, n/v, or fever/chills.    The following portions of the patient's history were reviewed and updated as appropriate: allergies, current medications, past family history, past medical history, past social history, past surgical history and problem list. Last pap smear on *** was normal, *** HRHPV.  Health Maintenance Due  Topic Date Due   COVID-19 Vaccine (1) Never done   HPV VACCINES (1 - 2-dose series) Never done   INFLUENZA VACCINE  07/31/2021     Review of Systems:  Pertinent items are noted in HPI.   Objective:  Physical Exam not currently breastfeeding. VS reviewed, nursing note reviewed,  Constitutional: well developed, well nourished, no distress HEENT: normocephalic CV: normal rate Pulm/chest wall: normal effort Breast Exam: deferred Abdomen: soft Neuro: alert and oriented x 3 Skin: warm, dry Psych: affect normal Pelvic exam: Cervix pink, visually closed, without lesion, scant white creamy discharge, vaginal walls and external genitalia normal Bimanual exam: Cervix 0/long/high, firm, anterior, neg CMT, uterus nontender, nonenlarged, adnexa without tenderness, enlargement, or mass  Assessment & Plan:  There are no diagnoses linked to this encounter.  Sharen Counter, CNM 8:48 AM

## 2021-10-11 ENCOUNTER — Telehealth: Payer: Self-pay | Admitting: *Deleted

## 2021-10-11 ENCOUNTER — Other Ambulatory Visit: Payer: Self-pay | Admitting: Obstetrics

## 2021-10-11 DIAGNOSIS — I10 Essential (primary) hypertension: Secondary | ICD-10-CM

## 2021-10-11 MED ORDER — NIFEDIPINE ER 30 MG PO TB24: 60.0000 mg | ORAL_TABLET | Freq: Every day | ORAL | 0 refills | Status: DC

## 2021-10-11 NOTE — Telephone Encounter (Signed)
Pt called to office for refill on BP medication. States she has been out of meds x 2 weeks.  Pt has not been seen since PP visit in August.     Please review and advise on Rx

## 2021-10-12 ENCOUNTER — Other Ambulatory Visit: Payer: Self-pay | Admitting: *Deleted

## 2021-10-12 DIAGNOSIS — I10 Essential (primary) hypertension: Secondary | ICD-10-CM

## 2021-10-12 MED ORDER — NIFEDIPINE ER 30 MG PO TB24: 60.0000 mg | ORAL_TABLET | Freq: Every day | ORAL | 0 refills | Status: DC

## 2021-10-12 NOTE — Progress Notes (Signed)
Nifedipine Rx resent as did not go through when previously ordered.

## 2021-10-25 ENCOUNTER — Telehealth: Payer: Self-pay

## 2021-10-25 DIAGNOSIS — B009 Herpesviral infection, unspecified: Secondary | ICD-10-CM

## 2021-10-25 MED ORDER — VALACYCLOVIR HCL 500 MG PO TABS: 500.0000 mg | ORAL_TABLET | Freq: Two times a day (BID) | ORAL | 3 refills | Status: DC

## 2021-10-25 NOTE — Telephone Encounter (Signed)
Pt called stating she is having an outbreak of HSV and is requesting a RF of Valtrex. Will send in to pharmacy. Also patient needed to update demographic information to chart as she has moved. Will update information as provided by patient.

## 2021-11-13 ENCOUNTER — Ambulatory Visit: Payer: Managed Care, Other (non HMO) | Admitting: Obstetrics and Gynecology

## 2022-01-09 ENCOUNTER — Encounter (HOSPITAL_COMMUNITY): Payer: Self-pay | Admitting: *Deleted

## 2022-01-09 ENCOUNTER — Inpatient Hospital Stay (HOSPITAL_COMMUNITY)
Admission: AD | Admit: 2022-01-09 | Discharge: 2022-01-09 | Discharge status: Against Medical Advice | Payer: Managed Care, Other (non HMO) | Attending: Obstetrics and Gynecology | Admitting: Obstetrics and Gynecology

## 2022-01-09 DIAGNOSIS — Z5321 Procedure and treatment not carried out due to patient leaving prior to being seen by health care provider: Secondary | ICD-10-CM | POA: Insufficient documentation

## 2022-01-09 DIAGNOSIS — R109 Unspecified abdominal pain: Secondary | ICD-10-CM | POA: Diagnosis not present

## 2022-01-09 DIAGNOSIS — O26899 Other specified pregnancy related conditions, unspecified trimester: Secondary | ICD-10-CM | POA: Insufficient documentation

## 2022-01-09 LAB — POCT PREGNANCY, URINE: Preg Test, Ur: POSITIVE — AB

## 2022-01-09 NOTE — MAU Note (Signed)
PT left AMA. Pt access obtain signed AMA document.

## 2022-01-09 NOTE — MAU Note (Signed)
PT SAYS SHE DID HPT LAST WEEK - POSITIVE YESTERDAY - STARTED VB - [place] - WHEN WIPING CRAMPING STARTED  Sunday - 5

## 2022-01-12 IMAGING — US US MFM FETAL BPP W/O NON-STRESS
1 series · 15 of 28 positions shown · non-contrast
Comparison: none

[Series 1: us mfm fetal bpp w/o non-stress · 43 acquisitions, 15 frames shown]
[im 1/43]
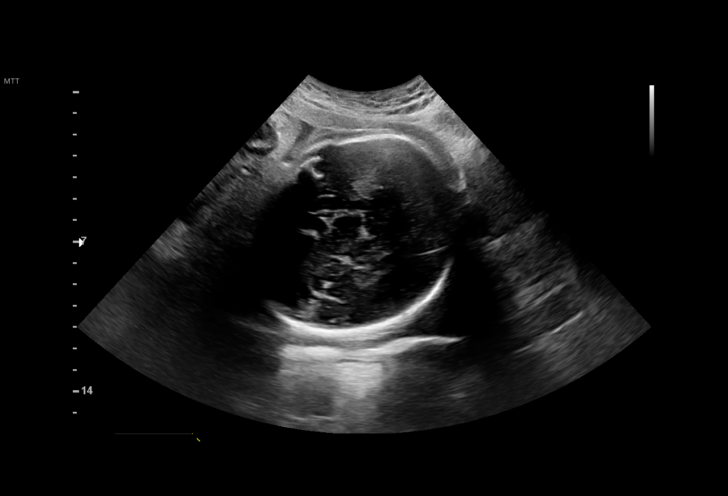
[im 4/43]
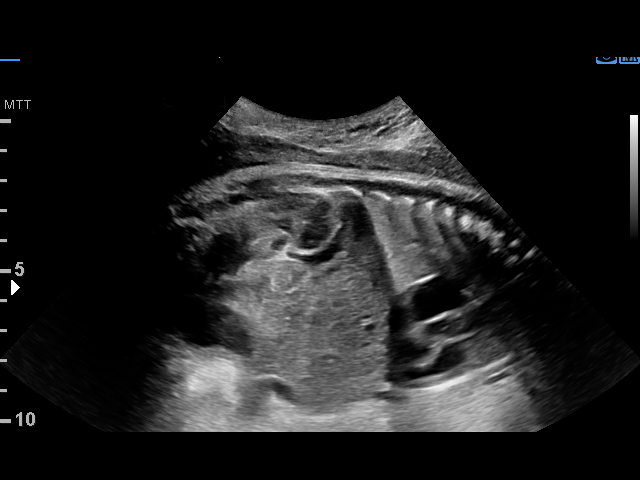
[im 7/43]
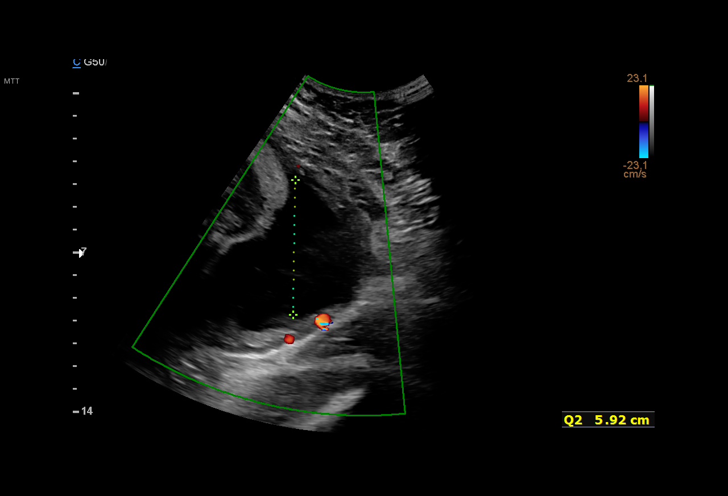
[im 10/43]
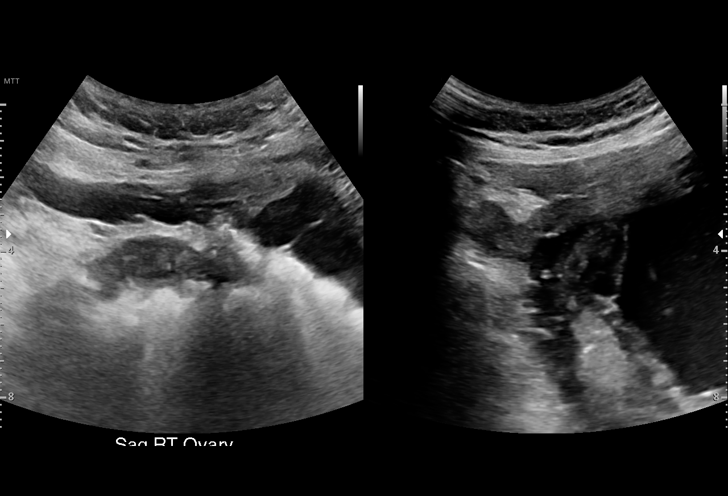
[im 13/43]
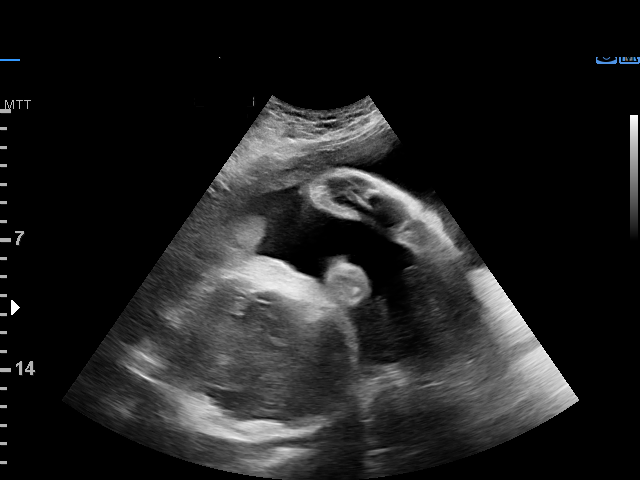
[im 16/43]
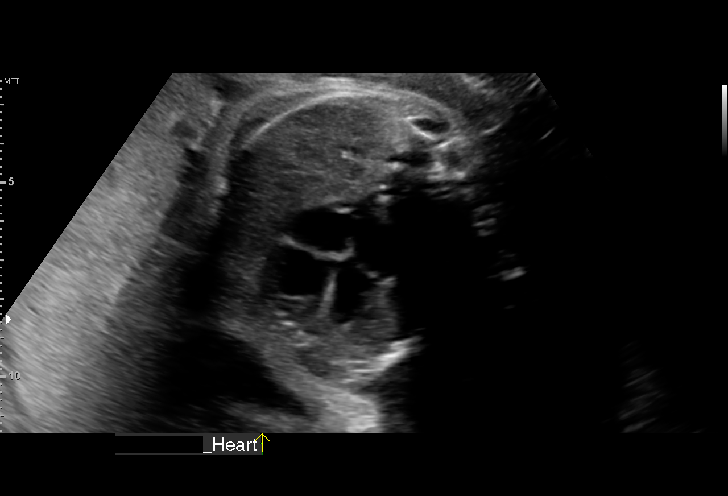
[im 19/43]
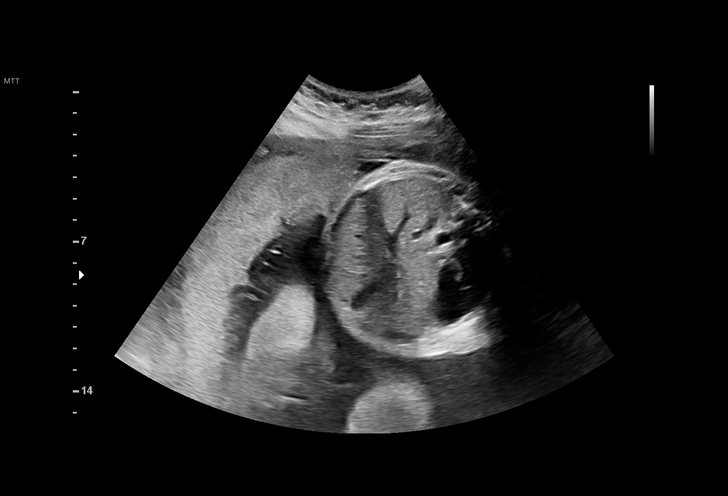
[im 22/43]
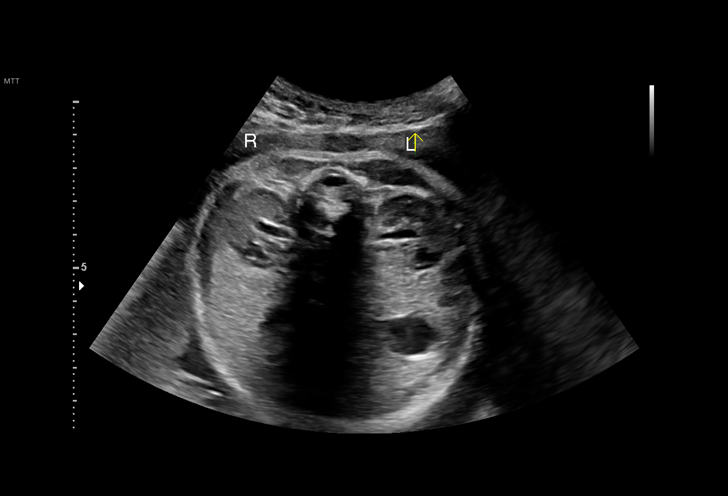
[im 24/43]
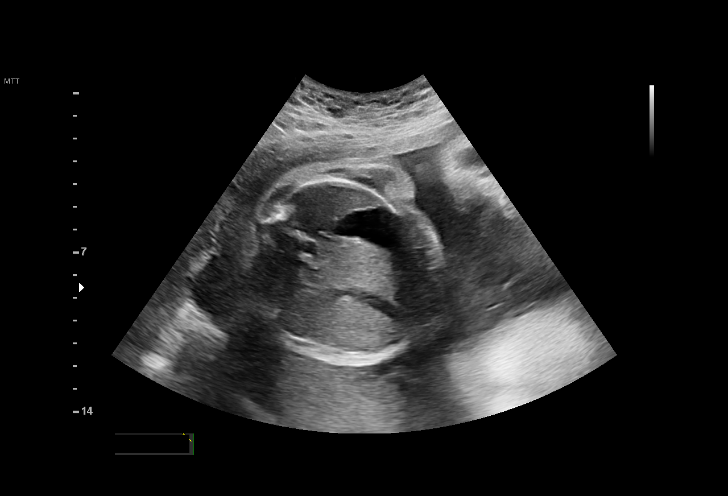
[im 27/43]
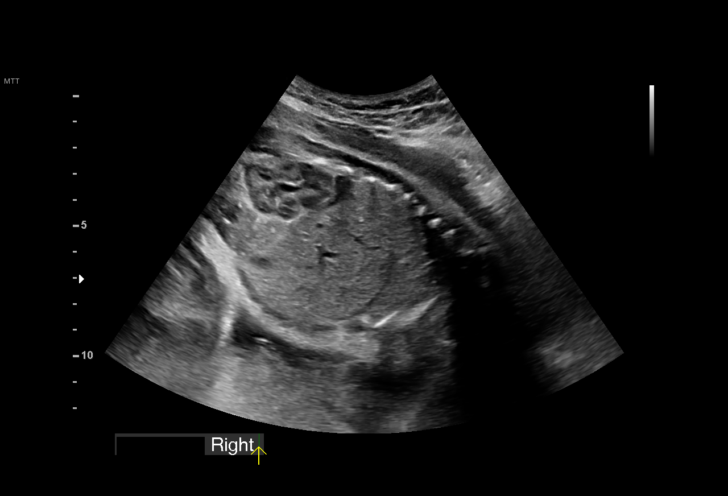
[im 30/43]
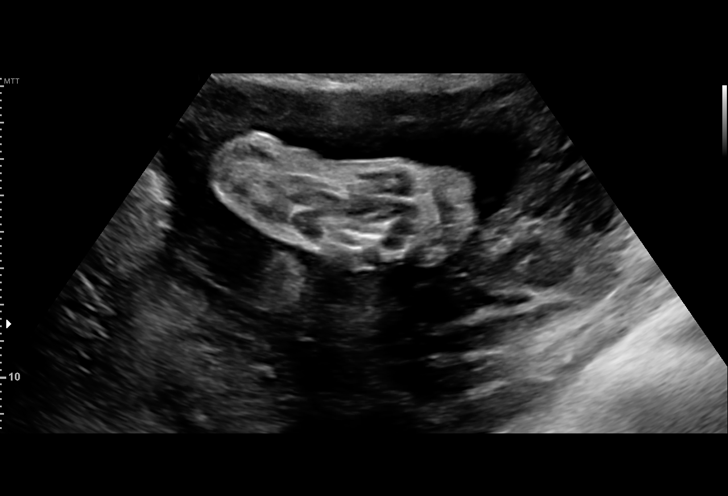
[im 33/43]
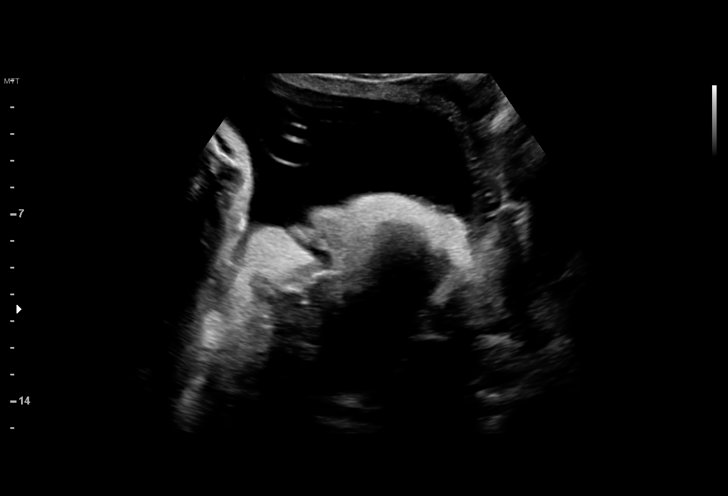
[im 36/43]
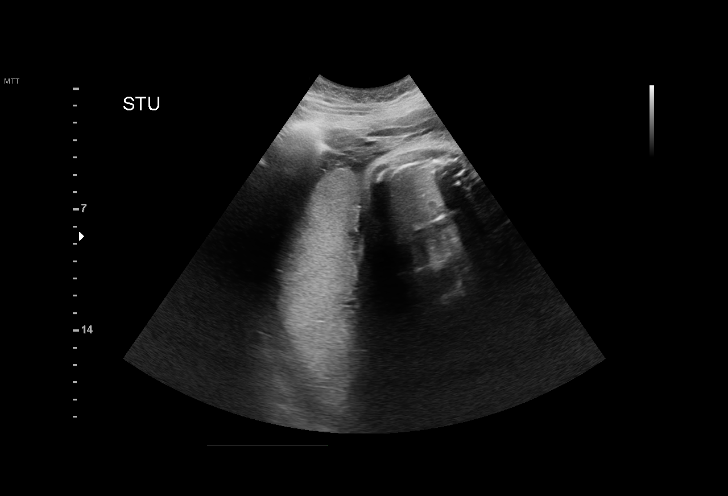
[im 39/43]
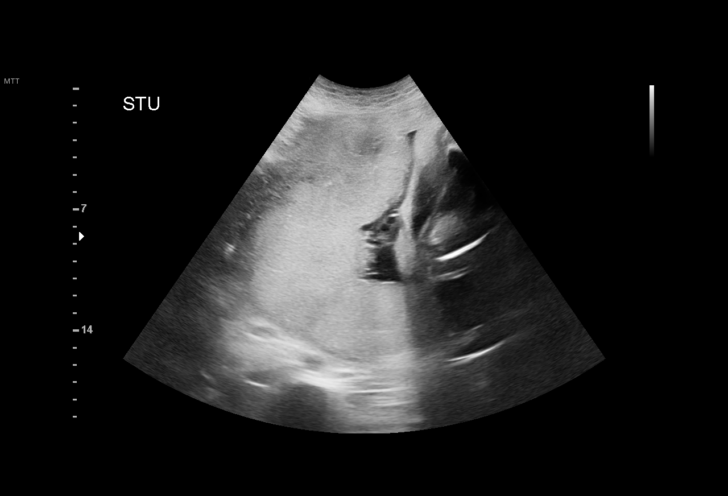
[im 43/43]
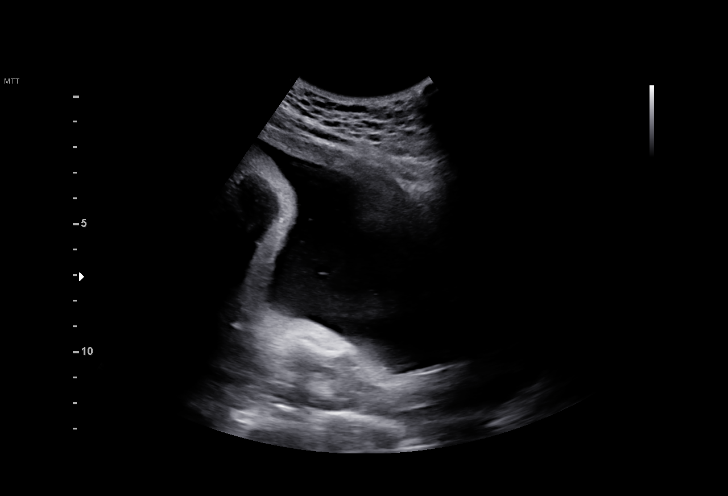

[15 of 28 positions shown; findings below may reference images not displayed]

Indications

 Hypertension - Chronic/Pre-existing
 (labetalol)
 34 weeks gestation of pregnancy
 Genetic carrier ([place], [place])
 Medical complication of pregnancy (Covid-
 19)
 LR NIPS/ Negative AFP
 Encounter for other antenatal screening
 follow-up
Fetal Evaluation

 Num Of Fetuses:         1
 Fetal Heart Rate(bpm):  157
 Cardiac Activity:       Observed
 Presentation:           Cephalic
 Placenta:               Posterior Fundal
 P. Cord Insertion:      Previously Visualized

 Amniotic Fluid
 AFI FV:      Within normal limits

 AFI Sum(cm)     %Tile       Largest Pocket(cm)
 24.4            93

 RUQ(cm)       RLQ(cm)       LUQ(cm)        LLQ(cm)

Biophysical Evaluation
 Amniotic F.V:   Pocket => 2 cm             F. Tone:        Observed
 F. Movement:    Observed                   Score:          [DATE]
 F. Breathing:   Observed
OB History

 Gravidity:    3
 Living:       2
Gestational Age

 LMP:           36w 1d        Date:  10/05/20                 EDD:   07/12/21
 Clinical EDD:  34w 5d                                        EDD:   07/22/21
 Best:          34w 1d     Det. By:  U/S C R L  (01/02/21)    EDD:   07/26/21
Anatomy

 Ventricles:            Appears normal         Stomach:                Appears normal, left
                                                                       sided
 Heart:                 Appears normal         Kidneys:                Appear normal
                        (4CH, axis, and
                        situs)
 Diaphragm:             Appears normal         Bladder:                Appears normal
Cervix Uterus Adnexa

 Cervix
 Not visualized (advanced GA >33wks)

 Uterus
 No abnormality visualized.

 Right Ovary
 Not visualized.

 Left Ovary
 Not visualized.

 Cul De Sac
 No free fluid seen.

 Adnexa
 No adnexal mass visualized.
Comments

 This patient was seen for a biophysical profile due to chronic
 hypertension treated with labetalol.  She denies any problems
 since her last exam.
 A biophysical profile performed today was [DATE].
 There was normal amniotic fluid noted on today's ultrasound
 exam.
 She will return in 1 week for another biophysical profile.

## 2022-01-26 ENCOUNTER — Other Ambulatory Visit: Payer: Self-pay | Admitting: *Deleted

## 2022-01-26 ENCOUNTER — Encounter: Payer: Self-pay | Admitting: *Deleted

## 2022-01-26 DIAGNOSIS — Z3480 Encounter for supervision of other normal pregnancy, unspecified trimester: Secondary | ICD-10-CM

## 2022-01-26 MED ORDER — PROMETHAZINE HCL 25 MG PO TABS: 25.0000 mg | ORAL_TABLET | Freq: Four times a day (QID) | ORAL | 0 refills | Status: DC | PRN

## 2022-01-26 NOTE — Progress Notes (Signed)
TC from patient requesting nausea med for early pregnancy nausea and vomiting. Patient of record with [facility]. Has not had first OB visit but has documented pregnancy from MAU visit. RX for phenergan sent per protocol. Call transferred to front office to schedule first OB visit/ intake. Caller hung up. Message sent via MyChart notifying patient no further meds will be sent until she is completes OB Intake/ New OB visit.

## 2022-01-29 ENCOUNTER — Inpatient Hospital Stay (HOSPITAL_COMMUNITY)
Admission: AD | Admit: 2022-01-29 | Discharge: 2022-01-30 | Disposition: A | Discharge status: Home / Self Care | Payer: Managed Care, Other (non HMO) | Attending: Obstetrics and Gynecology | Admitting: Obstetrics and Gynecology

## 2022-01-29 ENCOUNTER — Other Ambulatory Visit: Payer: Self-pay

## 2022-01-29 ENCOUNTER — Encounter (HOSPITAL_COMMUNITY): Payer: Self-pay | Admitting: Obstetrics and Gynecology

## 2022-01-29 DIAGNOSIS — Z3A01 Less than 8 weeks gestation of pregnancy: Secondary | ICD-10-CM | POA: Insufficient documentation

## 2022-01-29 DIAGNOSIS — Z20822 Contact with and (suspected) exposure to covid-19: Secondary | ICD-10-CM | POA: Insufficient documentation

## 2022-01-29 DIAGNOSIS — Z3480 Encounter for supervision of other normal pregnancy, unspecified trimester: Secondary | ICD-10-CM

## 2022-01-29 DIAGNOSIS — O219 Vomiting of pregnancy, unspecified: Secondary | ICD-10-CM | POA: Insufficient documentation

## 2022-01-29 DIAGNOSIS — O09291 Supervision of pregnancy with other poor reproductive or obstetric history, first trimester: Secondary | ICD-10-CM | POA: Diagnosis not present

## 2022-01-29 LAB — RESP PANEL BY RT-PCR (FLU A&B, COVID) ARPGX2
Influenza A by PCR: NEGATIVE
Influenza B by PCR: NEGATIVE
SARS Coronavirus 2 by RT PCR: NEGATIVE

## 2022-01-29 LAB — URINALYSIS, MICROSCOPIC (REFLEX)

## 2022-01-29 LAB — URINALYSIS, ROUTINE W REFLEX MICROSCOPIC
Bilirubin Urine: NEGATIVE
Glucose, UA: NEGATIVE mg/dL
Hgb urine dipstick: NEGATIVE
Ketones, ur: 15 mg/dL — AB
Leukocytes,Ua: NEGATIVE
Nitrite: NEGATIVE
Protein, ur: 30 mg/dL — AB
Specific Gravity, Urine: 1.025 (ref 1.005–1.030)
pH: 6.5 (ref 5.0–8.0)

## 2022-01-29 MED ORDER — SODIUM CHLORIDE 0.9 % IV SOLN
25.0000 mg | Freq: Once | INTRAVENOUS | Status: AC
  Administered 2022-01-29: 25 mg via INTRAVENOUS
  Filled 2022-01-29: qty 1

## 2022-01-29 MED ORDER — FAMOTIDINE IN NACL 20-0.9 MG/50ML-% IV SOLN
20.0000 mg | Freq: Once | INTRAVENOUS | Status: AC
  Administered 2022-01-29: 20 mg via INTRAVENOUS
  Filled 2022-01-29: qty 50

## 2022-01-29 MED ORDER — LACTATED RINGERS IV BOLUS
1000.0000 mL | Freq: Once | INTRAVENOUS | Status: AC
  Administered 2022-01-29: 1000 mL via INTRAVENOUS

## 2022-01-29 MED ORDER — ACETAMINOPHEN 10 MG/ML IV SOLN
1000.0000 mg | Freq: Once | INTRAVENOUS | Status: AC
  Administered 2022-01-29: 1000 mg via INTRAVENOUS
  Filled 2022-01-29: qty 100

## 2022-01-29 MED ORDER — ACETAMINOPHEN 10 MG/ML IV SOLN: 1000.0000 mg | Freq: Four times a day (QID) | INTRAVENOUS | Status: DC

## 2022-01-29 MED ORDER — ONDANSETRON HCL 4 MG/2ML IJ SOLN
4.0000 mg | Freq: Once | INTRAMUSCULAR | Status: AC
  Administered 2022-01-29: 4 mg via INTRAVENOUS
  Filled 2022-01-29: qty 2

## 2022-01-29 NOTE — MAU Note (Signed)
I think I may have the flu. 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 This morning started having chills, body aches, nausea, and fever. "I feel worst than I did yesterday" my son slept in the bed with me last night and he was sick at school with a fever, his grandmother took him to the doctor and he tested positive for flu.

## 2022-01-29 NOTE — MAU Provider Note (Signed)
Chief Complaint:  Fever, Generalized Body Aches, Nausea, and Emesis   Event Date/Time   First Provider Initiated Contact with Patient 01/29/22 2039     HPI: Michelle Thompson is a 26 y.o. G4P2002 at [redacted]w[redacted]d who presents to maternity admissions reporting flu-like symptoms that began yesterday at work (she is a travel Therapist, sports). Yesterday she felt run-down, this morning she started having chills, body aches, nausea (progressing to vomiting) and fever. Her son is also sick and he tested positive for flu today. Denies vaginal bleeding or abdominal cramping. Has been battling nausea since finding out she is pregnant, has a history of chronic hypertension and hyperemesis gravidarum.   Pregnancy Course: Receives care at [facility], has not gone in for her new OB appt yet.  Past Medical History:  Diagnosis Date   Complication of anesthesia    fentanyl itching   Hypertension    Gestational Hypertension   OB History  Gravida Para Term Preterm AB Living  4 2 2     2   SAB IAB Ectopic Multiple Live Births        0 2    # Outcome Date GA Lbr Len/2nd Weight Sex Delivery Anes PTL Lv  4 Current           3 Term 07/20/20 [redacted]w[redacted]d 11:50 / 00:07 6 lb 6.5 oz (2.906 kg) M Vag-Spont EPI  LIV     Birth Comments:  wnl  2 Term 01/14/16    M Vag-Spont   LIV  1 Gravida            Past Surgical History:  Procedure Laterality Date   NO PAST SURGERIES     Family History  Problem Relation Age of Onset   Lupus Mother    Healthy Father    Asthma Brother    Diabetes Maternal Grandmother    COPD Maternal Grandmother    Cancer Maternal Grandmother    Diabetes Maternal Grandfather    Stroke Maternal Grandfather    Social History   Tobacco Use   Smoking status: Never   Smokeless tobacco: Never  Vaping Use   Vaping Use: Never used  Substance Use Topics   Alcohol use: No   Drug use: No   Allergies  Allergen Reactions   Fentanyl Shortness Of Breath and Itching    7.11.2022 Pt reports that she was having anxiety  and this was the reason for shortness of breath.   Medications Prior to Admission  Medication Sig Dispense Refill Last Dose   acetaminophen (TYLENOL) 500 MG tablet Take 2 tablets (1,000 mg total) by mouth every 6 (six) hours as needed (for pain scale < 4). (Patient not taking: No sig reported) 30 tablet 0    coconut oil OIL Apply 1 application topically as needed. (Patient not taking: No sig reported)  0    ibuprofen (ADVIL) 600 MG tablet Take 1 tablet (600 mg total) by mouth every 6 (six) hours. (Patient not taking: No sig reported) 30 tablet 0    medroxyPROGESTERone (DEPO-PROVERA) 150 MG/ML injection Inject 1 mL (150 mg total) into the muscle every 3 (three) months. 1 mL 4    metroNIDAZOLE (FLAGYL) 500 MG tablet Take 1 tablet (500 mg total) by mouth 2 (two) times daily. 14 tablet 0    naproxen (NAPROSYN) 500 MG tablet Take 1 tablet (500 mg total) by mouth 2 (two) times daily with a meal. 30 tablet 0    NIFEdipine (ADALAT CC) 30 MG 24 hr tablet Take 2 tablets (60  mg total) by mouth daily. 90 tablet 0    valACYclovir (VALTREX) 500 MG tablet Take 1 tablet (500 mg total) by mouth 2 (two) times daily. 30 tablet 3    [DISCONTINUED] promethazine (PHENERGAN) 25 MG tablet Take 1 tablet (25 mg total) by mouth every 6 (six) hours as needed for nausea or vomiting. 30 tablet 0     I have reviewed patient's Past Medical Hx, Surgical Hx, Family Hx, Social Hx, medications and allergies.   ROS:  Review of Systems  Constitutional:  Positive for chills, fatigue and fever. Negative for diaphoresis.  HENT:  Negative for congestion and sinus pressure.   Eyes:  Negative for visual disturbance.  Respiratory:  Negative for cough and shortness of breath.   Gastrointestinal:  Positive for diarrhea, nausea and vomiting.  Genitourinary:  Negative for flank pain, pelvic pain and vaginal bleeding.  Musculoskeletal:  Positive for arthralgias and myalgias.  Neurological:  Negative for syncope, light-headedness and  headaches.   Physical Exam  Patient Vitals for the past 24 hrs:  BP Temp Temp src Pulse Resp SpO2 Height Weight  01/29/22 2034 130/79 -- -- 90 -- -- -- --  01/29/22 1957 (!) 150/89 99.7 F (37.6 C) Oral (!) 106 19 99 % 5\' 2"  (1.575 m) 157 lb (71.2 kg)   Constitutional: Well-developed, well-nourished female, ill-appearing, curled in fetal position in bed Cardiovascular: normal rate & rhythm Respiratory: normal effort, lung sounds clear throughout GI: Abd soft, non-tender, pos BS x 4 MS: Extremities nontender, no edema, normal ROM Neurologic: Alert and oriented x 4.  GU: no CVA tenderness Pelvic: exam deferred   Labs: Results for orders placed or performed during the hospital encounter of 01/29/22 (from the past 24 hour(s))  Resp Panel by RT-PCR (Flu A&B, Covid) Nasopharyngeal Swab     Status: None   Collection Time: 01/29/22  8:01 PM   Specimen: Nasopharyngeal Swab; Nasopharyngeal(NP) swabs in vial transport medium  Result Value Ref Range   SARS Coronavirus 2 by RT PCR NEGATIVE NEGATIVE   Influenza A by PCR NEGATIVE NEGATIVE   Influenza B by PCR NEGATIVE NEGATIVE  Urinalysis, Routine w reflex microscopic Urine, Clean Catch     Status: Abnormal   Collection Time: 01/29/22  8:35 PM  Result Value Ref Range   Color, Urine YELLOW YELLOW   APPearance CLEAR CLEAR   Specific Gravity, Urine 1.025 1.005 - 1.030   pH 6.5 5.0 - 8.0   Glucose, UA NEGATIVE NEGATIVE mg/dL   Hgb urine dipstick NEGATIVE NEGATIVE   Bilirubin Urine NEGATIVE NEGATIVE   Ketones, ur 15 (A) NEGATIVE mg/dL   Protein, ur 30 (A) NEGATIVE mg/dL   Nitrite NEGATIVE NEGATIVE   Leukocytes,Ua NEGATIVE NEGATIVE  Urinalysis, Microscopic (reflex)     Status: Abnormal   Collection Time: 01/29/22  8:35 PM  Result Value Ref Range   RBC / HPF 0-5 0 - 5 RBC/hpf   WBC, UA 0-5 0 - 5 WBC/hpf   Bacteria, UA RARE (A) NONE SEEN   Squamous Epithelial / LPF 0-5 0 - 5   Mucus PRESENT    Imaging:  No results found.  MAU  Course: Orders Placed This Encounter  Procedures   Resp Panel by RT-PCR (Flu A&B, Covid) Nasopharyngeal Swab   Urinalysis, Routine w reflex microscopic   Urinalysis, Microscopic (reflex)   Discharge patient   Meds ordered this encounter  Medications   lactated ringers bolus 1,000 mL   ondansetron (ZOFRAN) injection 4 mg   famotidine (PEPCID) IVPB  20 mg premix   promethazine (PHENERGAN) 25 mg in sodium chloride 0.9 % 1,000 mL infusion   DISCONTD: acetaminophen (OFIRMEV) IV 1,000 mg    Order Specific Question:   Is the patient UNABLE to take oral / enteral medications?    Answer:   Yes    Order Specific Question:   Does the patient have a contraindication to NSAID use? (If YES, specify on line 3)    Answer:   Yes    Order Specific Question:   Answers to IV acetaminophen criteria above:    Answer:   "Yes" to all criteria.   acetaminophen (OFIRMEV) IV 1,000 mg    Order Specific Question:   Is the patient UNABLE to take oral / enteral medications?    Answer:   Yes    Order Specific Question:   Does the patient have a contraindication to NSAID use? (If YES, specify on line 3)    Answer:   Yes    Order Specific Question:   Answers to IV acetaminophen criteria above:    Answer:   "Yes" to all criteria.   promethazine (PHENERGAN) 25 MG tablet    Sig: Take 1 tablet (25 mg total) by mouth every 6 (six) hours as needed for nausea or vomiting.    Dispense:  30 tablet    Refill:  5    Order Specific Question:   Supervising Provider    Answer:   Donnamae Jude [id number]   MDM: LR bolus + zofran and pepcid given with partial relief of nausea but body aches remained. Ordered IV Tylenol and another bag of LR with phenergan which completely relieved her nausea and body aches better ("tolerable"). Pt requested discharge home with meds for nausea, phenergan has worked best in the past.  Assessment: 1. Nausea/vomiting in pregnancy   2. [redacted] weeks gestation of pregnancy   3. Supervision of other normal  pregnancy, antepartum    Plan: Discharge home in stable condition with return precautions.     Follow-up Information     [facility]. Schedule an appointment as soon as possible for a visit.   Specialty: Obstetrics and Gynecology Why: to establish routine OB care Contact information: [address] [place] [phone]                Allergies as of 01/30/2022       Reactions   Fentanyl Shortness Of Breath, Itching   7.11.2022 Pt reports that she was having anxiety and this was the reason for shortness of breath.        Medication List     STOP taking these medications    acetaminophen 500 MG tablet Commonly known as: TYLENOL   coconut oil Oil   ibuprofen 600 MG tablet Commonly known as: ADVIL   medroxyPROGESTERone 150 MG/ML injection Commonly known as: DEPO-PROVERA   metroNIDAZOLE 500 MG tablet Commonly known as: FLAGYL   naproxen 500 MG tablet Commonly known as: NAPROSYN   NIFEdipine 30 MG 24 hr tablet Commonly known as: ADALAT CC       TAKE these medications    promethazine 25 MG tablet Commonly known as: PHENERGAN Take 1 tablet (25 mg total) by mouth every 6 (six) hours as needed for nausea or vomiting.   valACYclovir 500 MG tablet Commonly known as: Valtrex Take 1 tablet (500 mg total) by mouth 2 (two) times daily.       Gaylan Gerold, CNM,  MSN, IBCLC Certified Nurse Midwife, [place]

## 2022-01-30 DIAGNOSIS — O219 Vomiting of pregnancy, unspecified: Secondary | ICD-10-CM

## 2022-01-30 DIAGNOSIS — Z3A01 Less than 8 weeks gestation of pregnancy: Secondary | ICD-10-CM

## 2022-01-30 MED ORDER — PROMETHAZINE HCL 25 MG PO TABS: 25.0000 mg | ORAL_TABLET | Freq: Four times a day (QID) | ORAL | 5 refills | Status: DC | PRN

## 2022-02-23 ENCOUNTER — Other Ambulatory Visit: Payer: Self-pay | Admitting: Obstetrics & Gynecology

## 2022-02-23 DIAGNOSIS — O3680X0 Pregnancy with inconclusive fetal viability, not applicable or unspecified: Secondary | ICD-10-CM

## 2022-02-26 ENCOUNTER — Other Ambulatory Visit: Payer: Managed Care, Other (non HMO)

## 2022-03-03 ENCOUNTER — Other Ambulatory Visit: Payer: Self-pay

## 2022-03-03 ENCOUNTER — Inpatient Hospital Stay (HOSPITAL_COMMUNITY)
Admission: AD | Admit: 2022-03-03 | Discharge: 2022-03-04 | Disposition: A | Discharge status: Home / Self Care | Payer: Managed Care, Other (non HMO) | Attending: Obstetrics and Gynecology | Admitting: Obstetrics and Gynecology

## 2022-03-03 DIAGNOSIS — Z3A11 11 weeks gestation of pregnancy: Secondary | ICD-10-CM | POA: Insufficient documentation

## 2022-03-03 DIAGNOSIS — R109 Unspecified abdominal pain: Secondary | ICD-10-CM

## 2022-03-03 DIAGNOSIS — O26899 Other specified pregnancy related conditions, unspecified trimester: Secondary | ICD-10-CM

## 2022-03-03 DIAGNOSIS — O23592 Infection of other part of genital tract in pregnancy, second trimester: Secondary | ICD-10-CM | POA: Diagnosis not present

## 2022-03-03 DIAGNOSIS — O98819 Other maternal infectious and parasitic diseases complicating pregnancy, unspecified trimester: Secondary | ICD-10-CM | POA: Diagnosis not present

## 2022-03-03 DIAGNOSIS — B3731 Acute candidiasis of vulva and vagina: Secondary | ICD-10-CM | POA: Diagnosis not present

## 2022-03-03 DIAGNOSIS — O26891 Other specified pregnancy related conditions, first trimester: Secondary | ICD-10-CM | POA: Insufficient documentation

## 2022-03-03 NOTE — MAU Note (Signed)
..  Michelle Thompson is a 26 y.o. at [redacted]w[redacted]d here in MAU reporting: ABD pain that felt like labor. Pt went to [facility] yesterday for the sharp ABD pain, and possible miscarriage. Pt expressed not having appropriate care and felt mistreated. She states she lost consciousness during that admission. She was discharged on two dose 5 mg oxycodone that she took at home around 2000, with no relief. Pt reports no severe VB, just one drop of old blood this am when she went to take a shower.Pt has not eaten or drank over 24 hours, and N/V all day yesterday but none today. ? ?Pain score: 8/10 ?Vitals:  ? 03/03/22 2335  ?BP: 126/84  ?Pulse: (!) 103  ?Resp: 18  ?Temp: 98.2 ?F (36.8 ?C)  ?SpO2: 97%  ?   ?FHT:149 ?Lab orders placed from triage:  UA ? ?

## 2022-03-04 ENCOUNTER — Inpatient Hospital Stay (HOSPITAL_COMMUNITY): Payer: Managed Care, Other (non HMO)

## 2022-03-04 DIAGNOSIS — O98819 Other maternal infectious and parasitic diseases complicating pregnancy, unspecified trimester: Secondary | ICD-10-CM

## 2022-03-04 DIAGNOSIS — O26891 Other specified pregnancy related conditions, first trimester: Secondary | ICD-10-CM | POA: Diagnosis not present

## 2022-03-04 DIAGNOSIS — B3731 Acute candidiasis of vulva and vagina: Secondary | ICD-10-CM | POA: Diagnosis not present

## 2022-03-04 DIAGNOSIS — Z3A11 11 weeks gestation of pregnancy: Secondary | ICD-10-CM | POA: Diagnosis not present

## 2022-03-04 LAB — URINALYSIS, ROUTINE W REFLEX MICROSCOPIC
Bilirubin Urine: NEGATIVE
Glucose, UA: NEGATIVE mg/dL
Hgb urine dipstick: NEGATIVE
Ketones, ur: 80 mg/dL — AB
Nitrite: NEGATIVE
Protein, ur: 100 mg/dL — AB
Specific Gravity, Urine: 1.025 (ref 1.005–1.030)
pH: 5 (ref 5.0–8.0)

## 2022-03-04 LAB — WET PREP, GENITAL
Clue Cells Wet Prep HPF POC: NONE SEEN
Sperm: NONE SEEN
Trich, Wet Prep: NONE SEEN
WBC, Wet Prep HPF POC: >=10 — AB (ref <10)

## 2022-03-04 MED ORDER — CYCLOBENZAPRINE HCL 5 MG PO TABS
10.0000 mg | ORAL_TABLET | Freq: Once | ORAL | Status: AC
Start: 2022-03-04 — End: 2022-03-04
  Administered 2022-03-04: 10 mg via ORAL
  Filled 2022-03-04: qty 2

## 2022-03-04 MED ORDER — TERCONAZOLE 0.4 % VA CREA: 1.0000 | TOPICAL_CREAM | Freq: Every day | VAGINAL | 0 refills | Status: DC

## 2022-03-04 NOTE — MAU Provider Note (Signed)
?History  ?  ? ?CSN: [id number] ? ?Arrival date and time: 03/03/22 2227 ? ? Event Date/Time  ? First Provider Initiated Contact with Patient 03/04/22 0013   ?  ? ?Chief Complaint  ?Patient presents with  ? Abdominal Pain  ? ?Michelle Thompson is a 26 y.o. J1P9150 at [redacted]w[redacted]d who plans to receive care at [facility].  She presents today for Abdominal Pain. Patient states she started experiencing "severe, sharp" l left-sided lower abdominal pain around 4 PM yesterday.  She describes the pain as similar to "waves of contractions" and last approximately 3 minutes when it occurs.  Patient states she also has been experiencing severe nausea.  She reports being seen at [facility] yesterday, but was unsatisfied with her treatment.  She states she received IV Dilaudid and was discharged home with oxycodone which she took this evening with improvement of pain.  She states throughout the day she has felt like she has had minimal strength and has been unable to eat due to nausea.  She also reports this morning noting some spotting in her underwear, but has had no other incidents.  She reports last sexual activity approximately 5 days ago. ? ? ? ?OB History   ? ? Gravida  ?4  ? Para  ?2  ? Term  ?2  ? Preterm  ?   ? AB  ?   ? Living  ?2  ?  ? ? SAB  ?   ? IAB  ?   ? Ectopic  ?   ? Multiple  ?0  ? Live Births  ?2  ?   ?  ?  ? ? ?Past Medical History:  ?Diagnosis Date  ? Complication of anesthesia   ? fentanyl itching  ? Hypertension   ? Gestational Hypertension  ? ? ?Past Surgical History:  ?Procedure Laterality Date  ? NO PAST SURGERIES    ? ? ?Family History  ?Problem Relation Age of Onset  ? Lupus Mother   ? Healthy Father   ? Asthma Brother   ? Diabetes Maternal Grandmother   ? COPD Maternal Grandmother   ? Cancer Maternal Grandmother   ? Diabetes Maternal Grandfather   ? Stroke Maternal Grandfather   ? ? ?Social History  ? ?Tobacco Use  ? Smoking status: Never  ? Smokeless tobacco: Never  ?Vaping Use  ? Vaping Use: Never used   ?Substance Use Topics  ? Alcohol use: No  ? Drug use: No  ? ? ?Allergies:  ?Allergies  ?Allergen Reactions  ? Fentanyl Shortness Of Breath and Itching  ?  7.11.2022 Pt reports that she was having anxiety and this was the reason for shortness of breath.  ? ? ?Medications Prior to Admission  ?Medication Sig Dispense Refill Last Dose  ? promethazine (PHENERGAN) 25 MG tablet Take 1 tablet (25 mg total) by mouth every 6 (six) hours as needed for nausea or vomiting. 30 tablet 5   ? valACYclovir (VALTREX) 500 MG tablet Take 1 tablet (500 mg total) by mouth 2 (two) times daily. 30 tablet 3   ? ? ?Review of Systems  ?Gastrointestinal:  Positive for abdominal pain and nausea. Negative for constipation, diarrhea and vomiting.  ?Genitourinary:  Positive for vaginal bleeding (Spotting this am x 1). Negative for difficulty urinating and dysuria.  ?Physical Exam  ? ?Blood pressure 126/84, pulse (!) 103, temperature 98.2 ?F (36.8 ?C), temperature source Oral, resp. rate 18, height 5\' 2"  (1.575 m), weight 69.4 kg, last menstrual period 12/12/2021, SpO2 97 %,  not currently breastfeeding. ? ?Physical Exam ?Vitals reviewed. Exam conducted with a chaperone present.  ?Constitutional:   ?   Appearance: She is well-developed.  ?HENT:  ?   Head: Normocephalic and atraumatic.  ?Eyes:  ?   Conjunctiva/sclera: Conjunctivae normal.  ?Cardiovascular:  ?   Rate and Rhythm: Normal rate.  ?Pulmonary:  ?   Effort: Pulmonary effort is normal.  ?Abdominal:  ?   General: Bowel sounds are normal.  ?   Tenderness: There is abdominal tenderness in the left lower quadrant. There is no guarding or rebound.  ?Genitourinary: ?   Labia:     ?   Right: No tenderness.     ?   Left: No tenderness.   ?   Vagina: No bleeding.  ?   Uterus: Enlarged. Not tender.   ?   Comments: Speculum Exam: ?-Normal External Genitalia: Non tender, no apparent discharge at introitus.  ?-Vaginal Vault: Pink mucosa with good rugae. Scant amt yellowish mucoid discharge -wet prep  collected ?-Cervix:Pink, no lesions, cysts, or polyps.  Appears closed. No active bleeding from os-GC/CT collected ?-Bimanual Exam:  Cervix closed. Uterine size ~ [redacted] wk GA.  No fundal tenderness.  +Tenderness in left cul de sac.  ?Skin: ?   General: Skin is warm and dry.  ?Neurological:  ?   Mental Status: She is alert and oriented to person, place, and time.  ?Psychiatric:     ?   Mood and Affect: Mood normal.  ? ? ?MAU Course  ?Procedures ?Results for orders placed or performed during the hospital encounter of 03/03/22 (from the past 24 hour(s))  ?Urinalysis, Routine w reflex microscopic     Status: Abnormal  ? Collection Time: 03/03/22 11:28 PM  ?Result Value Ref Range  ? Color, Urine YELLOW YELLOW  ? APPearance HAZY (A) CLEAR  ? Specific Gravity, Urine 1.025 1.005 - 1.030  ? pH 5.0 5.0 - 8.0  ? Glucose, UA NEGATIVE NEGATIVE mg/dL  ? Hgb urine dipstick NEGATIVE NEGATIVE  ? Bilirubin Urine NEGATIVE NEGATIVE  ? Ketones, ur 80 (A) NEGATIVE mg/dL  ? Protein, ur 100 (A) NEGATIVE mg/dL  ? Nitrite NEGATIVE NEGATIVE  ? Leukocytes,Ua SMALL (A) NEGATIVE  ? RBC / HPF 6-10 0 - 5 RBC/hpf  ? WBC, UA 6-10 0 - 5 WBC/hpf  ? Bacteria, UA FEW (A) NONE SEEN  ? Squamous Epithelial / LPF 6-10 0 - 5  ? Mucus PRESENT   ?Wet prep, genital     Status: Abnormal  ? Collection Time: 03/04/22 12:39 AM  ?Result Value Ref Range  ? Yeast Wet Prep HPF POC PRESENT (A) NONE SEEN  ? Trich, Wet Prep NONE SEEN NONE SEEN  ? Clue Cells Wet Prep HPF POC NONE SEEN NONE SEEN  ? WBC, Wet Prep HPF POC >=10 (A) <10  ? Sperm NONE SEEN   ? ?US OB Comp Less 14 Wks ? ?Result Date: 03/04/2022 ?CLINICAL DATA:  Left lower quadrant pain. EXAM: OBSTETRIC <14 WK ULTRASOUND TECHNIQUE: Transabdominal ultrasound was performed for evaluation of the gestation as well as the maternal uterus and adnexal regions. COMPARISON:  None. FINDINGS: Intrauterine gestational sac: Single Yolk sac:  No Embryo:  Yes Cardiac Activity: Yes Heart Rate: 151 bpm CRL:   55.7 mm   12 w 1 d                   [place] EDC: 09/15/2022 Subchorionic hemorrhage:  None visualized. Maternal uterus/adnexae: The uterus and ovaries are within normal  limits. No free fluid in the pelvis. IMPRESSION: Single live intrauterine pregnancy with estimated gestational age of [redacted] weeks 1 day. EDC: 09/15/2022. Electronically Signed   By: Thornell Sartorius M.D.   On: 03/04/2022 01:44   ? ?MDM ?Pelvic Exam; Wet Prep and GC/CT ?Labs: UA ?Ultrasound ?Pain Medication ?Assessment and Plan  ?26 year old, [id number]  ?SIUP at 11.5 weeks ?Left Sided Abdominal Pain ? ?-Reviewed POC with patient. ?-Exam performed and findings discussed.  ?-Cultures collected and pending.  ?-Patient offered and accepts pain medication.  Will give Flexeril. ?-UA with few bacteria, will send for culture. ?-Patient to ultrasound will await results. ? ?Cherre Robins ?03/04/2022, 12:13 AM  ? ?Reassessment (2:10 AM) ?-Results as above. ?-Provider to bedside to discuss. ?-Patient reports improvement in pain with Flexeril dosing. ?-Patient reports no nausea despite lack of treatment. ?-Informed of vaginal yeast infection and educated on how this can contribute to pain and discomfort during pregnancy. ?-Discussed treatment with Terazol cream.  Rx sent to pharmacy on file. ?-Instructed to follow-up with primary OB office as scheduled. ?-Encouraged to call primary office or return to MAU if symptoms worsen or with the onset of new symptoms. ?-Discharged to home in improved condition. ? ?Cherre Robins MSN, CNM ?Systems developer, [facility] [employer] ? ? ?

## 2022-03-05 LAB — CULTURE, OB URINE

## 2022-03-05 LAB — GC/CHLAMYDIA PROBE AMP (~~LOC~~) NOT AT ARMC
Chlamydia: NEGATIVE
Comment: NEGATIVE
Comment: NORMAL
Neisseria Gonorrhea: NEGATIVE

## 2022-03-07 ENCOUNTER — Ambulatory Visit (INDEPENDENT_AMBULATORY_CARE_PROVIDER_SITE_OTHER): Payer: Managed Care, Other (non HMO) | Admitting: Obstetrics and Gynecology

## 2022-03-07 ENCOUNTER — Encounter: Payer: Self-pay | Admitting: Obstetrics and Gynecology

## 2022-03-07 ENCOUNTER — Other Ambulatory Visit: Payer: Self-pay

## 2022-03-07 VITALS — BP 139/94 | HR 94 | Ht 62.0 in | Wt 156.4 lb

## 2022-03-07 DIAGNOSIS — Z3481 Encounter for supervision of other normal pregnancy, first trimester: Secondary | ICD-10-CM

## 2022-03-07 DIAGNOSIS — Z1379 Encounter for other screening for genetic and chromosomal anomalies: Secondary | ICD-10-CM | POA: Diagnosis not present

## 2022-03-07 DIAGNOSIS — Z113 Encounter for screening for infections with a predominantly sexual mode of transmission: Secondary | ICD-10-CM | POA: Diagnosis not present

## 2022-03-07 DIAGNOSIS — Z3200 Encounter for pregnancy test, result unknown: Secondary | ICD-10-CM

## 2022-03-07 DIAGNOSIS — Z3A12 12 weeks gestation of pregnancy: Secondary | ICD-10-CM

## 2022-03-07 DIAGNOSIS — Z7689 Persons encountering health services in other specified circumstances: Secondary | ICD-10-CM

## 2022-03-07 LAB — POCT URINE PREGNANCY: Preg Test, Ur: POSITIVE — AB

## 2022-03-07 NOTE — Progress Notes (Signed)
HPI: ?     [name] is a 26 y.o. [phone] who LMP was Patient's last menstrual period was 12/09/2021 (approximate). ? ?Subjective:  ? ?She presents today to reestablish care at [facility].  She was seen here for her last pregnancy but when the end of her pregnancy approached she wanted to be induced and so she went to [facility] to have this done.  She ended up delivering in [place].  The end of her last pregnancy was somewhat complicated by mild hypertension. ?She has had an ultrasound during this pregnancy at approximately 11 weeks showing crown-rump length with fetal heart tones.  She has approximately 12 weeks today.  She has not yet begun prenatal vitamins.  She is requesting all of her blood work be done today as coming for office visits regularly is an issue with her job. ? ?  Hx: ?The following portions of the patient's history were reviewed and updated as appropriate: ?            She  has a past medical history of Complication of anesthesia and Hypertension. ?She does not have any pertinent problems on file. ?She  has a past surgical history that includes No past surgeries. ?Her family history includes Asthma in her brother; COPD in her maternal grandmother; Cancer in her maternal grandmother; Diabetes in her maternal grandfather and maternal grandmother; Healthy in her father; Lupus in her mother; Stroke in her maternal grandfather. ?She  reports that she has never smoked. She has never used smokeless tobacco. She reports that she does not drink alcohol and does not use drugs. ?She has a current medication list which includes the following prescription(s): promethazine, valacyclovir, and [DISCONTINUED] hyoscyamine sulfate sl. ?She is allergic to fentanyl. ?      ?Review of Systems:  ?Review of Systems ? ?Constitutional: Denied constitutional symptoms, night sweats, recent illness, fatigue, fever, insomnia and weight loss.  ?Eyes: Denied eye symptoms, eye pain, photophobia, vision change and visual  disturbance.  ?Ears/Nose/Throat/Neck: Denied ear, nose, throat or neck symptoms, hearing loss, nasal discharge, sinus congestion and sore throat.  ?Cardiovascular: Denied cardiovascular symptoms, arrhythmia, chest pain/pressure, edema, exercise intolerance, orthopnea and palpitations.  ?Respiratory: Denied pulmonary symptoms, asthma, pleuritic pain, productive sputum, cough, dyspnea and wheezing.  ?Gastrointestinal: Denied, gastro-esophageal reflux, melena, nausea and vomiting.  ?Genitourinary: Denied genitourinary symptoms including symptomatic vaginal discharge, pelvic relaxation issues, and urinary complaints.  ?Musculoskeletal: Denied musculoskeletal symptoms, stiffness, swelling, muscle weakness and myalgia.  ?Dermatologic: Denied dermatology symptoms, rash and scar.  ?Neurologic: Denied neurology symptoms, dizziness, headache, neck pain and syncope.  ?Psychiatric: Denied psychiatric symptoms, anxiety and depression.  ?Endocrine: Denied endocrine symptoms including hot flashes and night sweats.  ? ?Meds: ?  ?Current Outpatient Medications on File Prior to Visit  ?Medication Sig Dispense Refill  ? promethazine (PHENERGAN) 25 MG tablet Take 1 tablet (25 mg total) by mouth every 6 (six) hours as needed for nausea or vomiting. 30 tablet 5  ? valACYclovir (VALTREX) 500 MG tablet Take 1 tablet (500 mg total) by mouth 2 (two) times daily. 30 tablet 3  ? [DISCONTINUED] Hyoscyamine Sulfate SL (LEVSIN/SL) 0.125 MG SUBL Place 1 tablet under the tongue every 12 (twelve) hours as needed (abdominal cramping). 10 tablet 0  ? ?No current facility-administered medications on file prior to visit.  ? ? ? ? ?Objective:  ?  ? ?Vitals:  ? 03/07/22 1445  ?BP: (!) 139/94  ?Pulse: 94  ? ?Filed Weights  ? 03/07/22 1445  ?Weight: 156 lb 6.4  oz (70.9 kg)  ? ?  ?          ?        ? ?Assessment:  ?  ?[id number] ?Patient Active Problem List  ? Diagnosis Date Noted  ? Encounter for induction of labor 07/10/2021  ? Vaginal delivery 07/10/2021  ?  Normal labor 07/04/2021  ? Preterm labor 07/04/2021  ? Alpha thalassemia silent carrier 04/05/2021  ? Genetic carrier 04/05/2021  ? LGSIL on Pap smear of cervix 01/17/2021  ? GBS (group B streptococcus) UTI complicating pregnancy 01/15/2021  ? History of gestational hypertension 01/09/2021  ? Supervision of high risk pregnancy, antepartum 01/02/2021  ? Anemia of pregnancy in third trimester   ? Chronic hypertension affecting pregnancy 06/22/2020  ? ?  ?1. Encounter to establish care   ?2. Possible pregnancy, not yet confirmed   ? ? Pregnancy previously confirmed by ultrasound ? ? ?Plan:  ?  ?       ? Prenatal Plan ?1.  The patient was given prenatal literature. ?2.  She was asked to begin on prenatal vitamins. ?3.  A prenatal lab panel to be drawn at nurse visit. ?4.  An ultrasound was ordered to better determine an EDC. ?5.  A nurse visit was scheduled. ?6.  Genetic testing and testing for other inheritable conditions discussed in detail. She will decide in the future whether to have these labs performed.  She desires MaterniT 21 testing today. ?7.  A general overview of pregnancy testing, visit schedule, ultrasound schedule, and prenatal care was discussed. ?8.  Prenatal labs today ? ?Orders ?Orders Placed This Encounter  ?Procedures  ? POCT urine pregnancy  ? ? No orders of the defined types were placed in this encounter. ?  ?  F/U ? No follow-ups on file. ?I spent 32 minutes involved in the care of this patient preparing to see the patient by obtaining and reviewing her medical history (including labs, imaging tests and prior procedures), documenting clinical information in the electronic health record (EHR), counseling and coordinating care plans, writing and sending prescriptions, ordering tests or procedures and in direct communicating with the patient and medical staff discussing pertinent items from her history and physical exam. ? ?Elonda Husky, M.D. ?03/07/2022 ?3:11 PM ? ? ? ? ?

## 2022-03-07 NOTE — Progress Notes (Signed)
Patient presents today for pregnancy confirmation. UPT resulted in positive. Patient states no other questions or concerns.  

## 2022-03-08 LAB — URINALYSIS, ROUTINE W REFLEX MICROSCOPIC
Bilirubin, UA: NEGATIVE
Glucose, UA: NEGATIVE
Ketones, UA: NEGATIVE
Nitrite, UA: NEGATIVE
RBC, UA: NEGATIVE
Specific Gravity, UA: 1.028 (ref 1.005–1.030)
Urobilinogen, Ur: 1 mg/dL (ref 0.2–1.0)
pH, UA: 6 (ref 5.0–7.5)

## 2022-03-08 LAB — MICROSCOPIC EXAMINATION: Casts: NONE SEEN /lpf

## 2022-03-08 LAB — VIRAL HEPATITIS HBV, HCV
HCV Ab: NONREACTIVE
Hep B Core Total Ab: NEGATIVE
Hep B Surface Ab, Qual: NONREACTIVE
Hepatitis B Surface Ag: NEGATIVE

## 2022-03-08 LAB — HGB SOLU + RFLX FRAC: Sickle Solubility Test - HGBRFX: NEGATIVE

## 2022-03-08 LAB — ABO AND RH: Rh Factor: POSITIVE

## 2022-03-08 LAB — RPR: RPR Ser Ql: NONREACTIVE

## 2022-03-08 LAB — RUBELLA SCREEN: Rubella Antibodies, IGG: 15.1 index (ref >0.99)

## 2022-03-08 LAB — ANTIBODY SCREEN: Antibody Screen: NEGATIVE

## 2022-03-08 LAB — HCV INTERPRETATION

## 2022-03-08 LAB — HIV ANTIBODY (ROUTINE TESTING W REFLEX): HIV Screen 4th Generation wRfx: NONREACTIVE

## 2022-03-08 LAB — VARICELLA ZOSTER ANTIBODY, IGG: Varicella zoster IgG: 930 index (ref >165)

## 2022-03-09 LAB — GC/CHLAMYDIA PROBE AMP
Chlamydia trachomatis, NAA: NEGATIVE
Neisseria Gonorrhoeae by PCR: NEGATIVE

## 2022-03-09 LAB — URINE CULTURE, OB REFLEX

## 2022-03-09 LAB — CULTURE, OB URINE

## 2022-03-12 LAB — MATERNIT21  PLUS CORE+ESS+SCA, BLOOD
11q23 deletion (Jacobsen): NOT DETECTED
15q11 deletion (PW Angelman): NOT DETECTED
1p36 deletion syndrome: NOT DETECTED
22q11 deletion (DiGeorge): NOT DETECTED
4p16 deletion(Wolf-Hirschhorn): NOT DETECTED
5p15 deletion (Cri-du-chat): NOT DETECTED
8q24 deletion (Langer-Giedion): NOT DETECTED
Fetal Fraction: 15
Monosomy X (Turner Syndrome): NOT DETECTED
Result (T21): NEGATIVE
Trisomy 13 (Patau syndrome): NEGATIVE
Trisomy 16: NOT DETECTED
Trisomy 18 (Edwards syndrome): NEGATIVE
Trisomy 21 (Down syndrome): NEGATIVE
Trisomy 22: NOT DETECTED
XXX (Triple X Syndrome): NOT DETECTED
XXY (Klinefelter Syndrome): NOT DETECTED
XYY (Jacobs Syndrome): NOT DETECTED

## 2022-03-12 LAB — PAIN MGT SCRN (14 DRUGS), UR
Amphetamine Scrn, Ur: NEGATIVE ng/mL
BARBITURATE SCREEN URINE: NEGATIVE ng/mL
BENZODIAZEPINE SCREEN, URINE: NEGATIVE ng/mL
Buprenorphine, Urine: NEGATIVE ng/mL
CANNABINOIDS UR QL SCN: NEGATIVE ng/mL
Cocaine (Metab) Scrn, Ur: NEGATIVE ng/mL
Creatinine(Crt), U: 176 mg/dL (ref 20.0–300.0)
Fentanyl, Urine: NEGATIVE pg/mL
Meperidine Screen, Urine: NEGATIVE ng/mL
Methadone Screen, Urine: NEGATIVE ng/mL
OXYCODONE+OXYMORPHONE UR QL SCN: NEGATIVE ng/mL
Opiate Scrn, Ur: NEGATIVE ng/mL
Ph of Urine: 6.1 (ref 4.5–8.9)
Phencyclidine Qn, Ur: NEGATIVE ng/mL
Propoxyphene Scrn, Ur: NEGATIVE ng/mL
Tramadol Screen, Urine: NEGATIVE ng/mL

## 2022-03-12 LAB — NICOTINE SCREEN, URINE: Cotinine Ql Scrn, Ur: NEGATIVE ng/mL

## 2022-03-30 ENCOUNTER — Encounter: Payer: Managed Care, Other (non HMO) | Admitting: Obstetrics and Gynecology

## 2022-04-04 ENCOUNTER — Encounter: Payer: Managed Care, Other (non HMO) | Admitting: Obstetrics and Gynecology

## 2022-04-04 DIAGNOSIS — Z124 Encounter for screening for malignant neoplasm of cervix: Secondary | ICD-10-CM

## 2022-04-04 DIAGNOSIS — Z3A16 16 weeks gestation of pregnancy: Secondary | ICD-10-CM

## 2022-04-04 DIAGNOSIS — Z3482 Encounter for supervision of other normal pregnancy, second trimester: Secondary | ICD-10-CM

## 2022-04-04 DIAGNOSIS — Z1379 Encounter for other screening for genetic and chromosomal anomalies: Secondary | ICD-10-CM

## 2022-05-03 ENCOUNTER — Other Ambulatory Visit (HOSPITAL_COMMUNITY)
Admission: RE | Admit: 2022-05-03 | Discharge: 2022-05-03 | Disposition: A | Discharge status: Home / Self Care | Payer: Managed Care, Other (non HMO) | Source: Ambulatory Visit | Attending: Obstetrics | Admitting: Obstetrics

## 2022-05-03 ENCOUNTER — Ambulatory Visit (INDEPENDENT_AMBULATORY_CARE_PROVIDER_SITE_OTHER): Payer: Managed Care, Other (non HMO) | Admitting: Obstetrics

## 2022-05-03 VITALS — BP 134/87 | HR 101 | Wt 160.2 lb

## 2022-05-03 DIAGNOSIS — Z124 Encounter for screening for malignant neoplasm of cervix: Secondary | ICD-10-CM | POA: Diagnosis not present

## 2022-05-03 DIAGNOSIS — Z3482 Encounter for supervision of other normal pregnancy, second trimester: Secondary | ICD-10-CM

## 2022-05-03 DIAGNOSIS — O162 Unspecified maternal hypertension, second trimester: Secondary | ICD-10-CM

## 2022-05-03 DIAGNOSIS — E049 Nontoxic goiter, unspecified: Secondary | ICD-10-CM | POA: Diagnosis not present

## 2022-05-03 DIAGNOSIS — Z113 Encounter for screening for infections with a predominantly sexual mode of transmission: Secondary | ICD-10-CM | POA: Insufficient documentation

## 2022-05-03 LAB — POCT URINALYSIS DIPSTICK OB
Bilirubin, UA: NEGATIVE
Blood, UA: NEGATIVE
Glucose, UA: NEGATIVE
Ketones, UA: NEGATIVE
Leukocytes, UA: NEGATIVE
Nitrite, UA: NEGATIVE
POC,PROTEIN,UA: NEGATIVE
Spec Grav, UA: 1.01 (ref 1.010–1.025)
Urobilinogen, UA: 0.2 E.U./dL
pH, UA: 8 (ref 5.0–8.0)

## 2022-05-03 MED ORDER — ASPIRIN EC 81 MG PO TBEC: 81.0000 mg | DELAYED_RELEASE_TABLET | Freq: Every day | ORAL | 5 refills | Status: DC

## 2022-05-03 NOTE — Progress Notes (Signed)
NEW OB HISTORY AND PHYSICAL ? ?SUBJECTIVE:  ?    ? Michelle Thompson is a 26 y.o. [phone] female, Patient's last menstrual period was 12/09/2021 (approximate)., Estimated Date of Delivery: 09/15/22, [redacted]w[redacted]d, presents today for establishment of Prenatal Care. 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 She is experiencing some situational depression and anxiety due to that situation but feels she has adequate social support and is managing well.  ? ?Social history ?Partner/Relationship: Married ?Living situation: lives with husband and children ?Work: travel Engineer, civil (consulting), night shift ?Substance use: denies tobacco, EtOH, vape, alcohol ?  ?Gynecologic History ?Patient's last menstrual period was 12/09/2021 (approximate).  ?Contraception: none ?Last Pap: 12/2020. Results were: LSIL, negative HPV ? ?Obstetric History ?OB History  ?Gravida Para Term Preterm AB Living  ?4 3 2 1   3   ?SAB IAB Ectopic Multiple Live Births  ?      0 3  ?  ?# Outcome Date GA Lbr Len/2nd Weight Sex Delivery Anes PTL Lv  ?4 Current           ?3 Preterm 2022     Vag-Spont   LIV  ?2 Term 07/20/20 [redacted]w[redacted]d 11:50 / 00:07 6 lb 6.5 oz (2.906 kg) M Vag-Spont EPI  LIV  ?   Birth Comments:  wnl  ?1 Term 01/14/16    M Vag-Spont   LIV  ? ? ?Past Medical History:  ?Diagnosis Date  ? Complication of anesthesia   ? fentanyl itching  ? Hypertension   ? Gestational Hypertension  ? ? ?Past Surgical History:  ?Procedure Laterality Date  ? NO PAST SURGERIES    ? ? ?Current Outpatient Medications on File Prior to Visit  ?Medication Sig Dispense Refill  ? prenatal vitamin w/FE, FA (PRENATAL 1 + 1) 27-1 MG TABS tablet Take 1 tablet by mouth daily at 12 noon.    ? promethazine (PHENERGAN) 25 MG tablet Take 1 tablet (25 mg total) by mouth every 6 (six) hours as needed for nausea or vomiting. 30 tablet 5  ? valACYclovir (VALTREX) 500 MG tablet Take 1 tablet (500 mg total) by mouth 2 (two) times daily. 30 tablet 3  ? [DISCONTINUED] Hyoscyamine Sulfate SL (LEVSIN/SL)  0.125 MG SUBL Place 1 tablet under the tongue every 12 (twelve) hours as needed (abdominal cramping). 10 tablet 0  ? ?No current facility-administered medications on file prior to visit.  ? ? ?Allergies  ?Allergen Reactions  ? Fentanyl Shortness Of Breath and Itching  ?  7.11.2022 Pt reports that she was having anxiety and this was the reason for shortness of breath.  ? ? ?Social History  ? ?Socioeconomic History  ? Marital status: Single  ?  Spouse name: Not on file  ? Number of children: Not on file  ? Years of education: Not on file  ? Highest education level: Not on file  ?Occupational History  ? Not on file  ?Tobacco Use  ? Smoking status: Never  ? Smokeless tobacco: Never  ?Vaping Use  ? Vaping Use: Never used  ?Substance and Sexual Activity  ? Alcohol use: No  ? Drug use: No  ? Sexual activity: Yes  ?  Partners: Male  ?  Birth control/protection: None  ?Other Topics Concern  ? Not on file  ?Social History Narrative  ? Not on file  ? ?Social Determinants of Health  ? ?Financial Resource Strain: Not on file  ?Food Insecurity: No Food Insecurity  ? Worried About 9.11.2022 in the Last  Year: Never true  ? Ran Out of Food in the Last Year: Never true  ?Transportation Needs: No Transportation Needs  ? Lack of Transportation (Medical): No  ? Lack of Transportation (Non-Medical): No  ?Physical Activity: Not on file  ?Stress: Not on file  ?Social Connections: Not on file  ?Intimate Partner Violence: Not on file  ? ? ?Family History  ?Problem Relation Age of Onset  ? Lupus Mother   ? Healthy Father   ? Asthma Brother   ? Diabetes Maternal Grandmother   ? COPD Maternal Grandmother   ? Cancer Maternal Grandmother   ? Diabetes Maternal Grandfather   ? Stroke Maternal Grandfather   ? ? ?The following portions of the patient's history were reviewed and updated as appropriate: allergies, current medications, past OB history, past medical history, past surgical history, past family history, past social history, and  problem list. ? ?History obtained from the patient ?General ROS: negative for - chills or fatigue ?Psychological ROS: positive for - situational depression ?Ophthalmic ROS: negative for - blurry vision ?Endocrine ROS: negative for - breast changes or palpitations ?Breast ROS: negative for breast lumps ?Respiratory ROS: no cough, shortness of breath, or wheezing ?Cardiovascular ROS: no chest pain or dyspnea on exertion ?Gastrointestinal ROS: no abdominal pain, change in bowel habits, or black or bloody stools ?Genito-Urinary ROS: no dysuria, trouble voiding, or hematuria ?positive for - vulvar/vaginal symptoms ?Musculoskeletal ROS: negative for - joint pain or muscle pain ?Dermatological ROS: negative ? ? ?OBJECTIVE: ?Initial Physical Exam (New OB) ? ?BP 134/87   Pulse (!) 101   Wt 160 lb 3.2 oz (72.7 kg)   LMP 12/09/2021 (Approximate)   BMI 29.30 kg/m?  ? ?GENERAL APPEARANCE: alert, well appearing, in no apparent distress, oriented to person, place and time ?HEAD: normocephalic, atraumatic ?MOUTH: mucous membranes moist, pharynx normal without lesions ?THYROID: enlarged ?BREASTS: no masses noted, no significant tenderness, no palpable axillary nodes, no skin changes ?LUNGS: clear to auscultation, no wheezes, rales or rhonchi, symmetric air entry ?HEART: regular rate and rhythm, systolic murmur ?ABDOMEN: soft, nontender, nondistended, no abnormal masses, no epigastric pain, fundus soft, nontender 21 cm, and FHT present: 142 ?EXTREMITIES: no redness or tenderness in the calves or thighs ?SKIN: normal coloration and turgor, no rashes ?LYMPH NODES: no adenopathy palpable ?NEUROLOGIC: alert, oriented, normal speech, no focal findings or movement disorder noted ? ?PELVIC EXAM EXTERNAL GENITALIA: normal appearing vulva with no masses, tenderness or lesions, mildly edematous without erythema ?VAGINA: no abnormal discharge or lesions ?CERVIX: no lesions or cervical motion tenderness. Erythema noted at 9-12 o'clock. Pap  collected and contact bleeding noted. ?UTERUS: gravid and consistent with 21 weeks ? ?ASSESSMENT: ?Normal pregnancy, [redacted]w[redacted]d ? ? ?PLAN: ?Routine prenatal care. We discussed an overview of prenatal care and when to call. Reviewed diet, exercise, and weight gain recommendations in pregnancy. Discussed HSV suppression. Will monitor BPs 1-2 times a day at home or work. Discussed benefits of breastfeeding and lactation resources at [facility]. Anatomy US ordered. Initial BP mildly elevated. Baseline preeclampsia labs and TSH drawn today. Started on ASA 81 mg. I reviewed labs and answered all questions. ? ?See orders ? ?Guadlupe Spanish, CNM  ?

## 2022-05-04 LAB — COMPREHENSIVE METABOLIC PANEL
ALT: 7 IU/L (ref 0–32)
AST: 10 IU/L (ref 0–40)
Albumin/Globulin Ratio: 1.2 (ref 1.2–2.2)
Albumin: 3.6 g/dL — ABNORMAL LOW (ref 3.9–5.0)
Alkaline Phosphatase: 89 IU/L (ref 44–121)
BUN/Creatinine Ratio: 10 (ref 9–23)
BUN: 7 mg/dL (ref 6–20)
Bilirubin Total: 0.2 mg/dL (ref 0.0–1.2)
CO2: 20 mmol/L (ref 20–29)
Calcium: 8.9 mg/dL (ref 8.7–10.2)
Chloride: 102 mmol/L (ref 96–106)
Creatinine, Ser: 0.69 mg/dL (ref 0.57–1.00)
Globulin, Total: 3 g/dL (ref 1.5–4.5)
Glucose: 107 mg/dL — ABNORMAL HIGH (ref 70–99)
Potassium: 4.1 mmol/L (ref 3.5–5.2)
Sodium: 136 mmol/L (ref 134–144)
Total Protein: 6.6 g/dL (ref 6.0–8.5)
eGFR: 125 mL/min/{1.73_m2} (ref >59)

## 2022-05-04 LAB — CBC
Hematocrit: 34.5 % (ref 34.0–46.6)
Hemoglobin: 11.2 g/dL (ref 11.1–15.9)
MCH: 22.9 pg — ABNORMAL LOW (ref 26.6–33.0)
MCHC: 32.5 g/dL (ref 31.5–35.7)
MCV: 71 fL — ABNORMAL LOW (ref 79–97)
Platelets: 231 10*3/uL (ref 150–450)
RBC: 4.89 x10E6/uL (ref 3.77–5.28)
RDW: 14 % (ref 11.7–15.4)
WBC: 9.4 10*3/uL (ref 3.4–10.8)

## 2022-05-04 LAB — CERVICOVAGINAL ANCILLARY ONLY
Bacterial Vaginitis (gardnerella): NEGATIVE
Candida Glabrata: NEGATIVE
Candida Vaginitis: NEGATIVE
Chlamydia: NEGATIVE
Comment: NEGATIVE
Comment: NEGATIVE
Comment: NEGATIVE
Comment: NEGATIVE
Comment: NEGATIVE
Comment: NORMAL
Neisseria Gonorrhea: NEGATIVE
Trichomonas: NEGATIVE

## 2022-05-04 LAB — PROTEIN / CREATININE RATIO, URINE
Creatinine, Urine: 99 mg/dL
Protein, Ur: 29.2 mg/dL
Protein/Creat Ratio: 295 mg/g creat — ABNORMAL HIGH (ref 0–200)

## 2022-05-04 LAB — TSH PREGNANCY: TSH Pregnancy: 0.589 u[IU]/mL (ref 0.450–4.500)

## 2022-05-07 LAB — CYTOLOGY - PAP
Comment: NEGATIVE
Diagnosis: NEGATIVE
High risk HPV: NEGATIVE

## 2022-05-10 ENCOUNTER — Telehealth: Payer: Self-pay | Admitting: Certified Nurse Midwife

## 2022-05-10 NOTE — Telephone Encounter (Signed)
Pt called would like to discuss labs. Pt states "feet are swollen and she is unable to walk with out pain".  Please advise ?

## 2022-05-10 NOTE — Telephone Encounter (Signed)
Was able to work her in with Michelle Thompson on 05/12 . Pt states the swelling is in her legs as well. I encouraged her to go to ED for evaluation if she noticed a headache, swelling increased or the pain became worse.  ?

## 2022-05-11 ENCOUNTER — Encounter: Payer: Managed Care, Other (non HMO) | Admitting: Obstetrics

## 2022-05-14 ENCOUNTER — Ambulatory Visit (INDEPENDENT_AMBULATORY_CARE_PROVIDER_SITE_OTHER): Payer: Managed Care, Other (non HMO)

## 2022-05-14 DIAGNOSIS — Z3A22 22 weeks gestation of pregnancy: Secondary | ICD-10-CM | POA: Diagnosis not present

## 2022-05-14 DIAGNOSIS — Z3482 Encounter for supervision of other normal pregnancy, second trimester: Secondary | ICD-10-CM | POA: Diagnosis not present

## 2022-05-15 ENCOUNTER — Encounter: Payer: Self-pay | Admitting: Obstetrics

## 2022-05-30 ENCOUNTER — Encounter: Payer: Managed Care, Other (non HMO) | Admitting: Certified Nurse Midwife

## 2022-05-30 DIAGNOSIS — Z3482 Encounter for supervision of other normal pregnancy, second trimester: Secondary | ICD-10-CM

## 2022-05-30 DIAGNOSIS — Z3A24 24 weeks gestation of pregnancy: Secondary | ICD-10-CM

## 2022-05-31 ENCOUNTER — Encounter (HOSPITAL_COMMUNITY): Payer: Self-pay | Admitting: Obstetrics & Gynecology

## 2022-05-31 ENCOUNTER — Other Ambulatory Visit: Payer: Self-pay

## 2022-05-31 ENCOUNTER — Inpatient Hospital Stay (HOSPITAL_COMMUNITY)
Admission: AD | Admit: 2022-05-31 | Discharge: 2022-05-31 | Disposition: A | Discharge status: Home / Self Care | Payer: Managed Care, Other (non HMO) | Attending: Obstetrics & Gynecology | Admitting: Obstetrics & Gynecology

## 2022-05-31 DIAGNOSIS — O26892 Other specified pregnancy related conditions, second trimester: Secondary | ICD-10-CM | POA: Diagnosis not present

## 2022-05-31 DIAGNOSIS — J069 Acute upper respiratory infection, unspecified: Secondary | ICD-10-CM | POA: Insufficient documentation

## 2022-05-31 DIAGNOSIS — Z3A24 24 weeks gestation of pregnancy: Secondary | ICD-10-CM | POA: Insufficient documentation

## 2022-05-31 DIAGNOSIS — Z20822 Contact with and (suspected) exposure to covid-19: Secondary | ICD-10-CM | POA: Diagnosis not present

## 2022-05-31 DIAGNOSIS — Z3689 Encounter for other specified antenatal screening: Secondary | ICD-10-CM

## 2022-05-31 DIAGNOSIS — R0981 Nasal congestion: Secondary | ICD-10-CM | POA: Diagnosis present

## 2022-05-31 LAB — URINALYSIS, ROUTINE W REFLEX MICROSCOPIC
Bilirubin Urine: NEGATIVE
Glucose, UA: NEGATIVE mg/dL
Hgb urine dipstick: NEGATIVE
Ketones, ur: NEGATIVE mg/dL
Nitrite: NEGATIVE
Protein, ur: NEGATIVE mg/dL
Specific Gravity, Urine: 1.008 (ref 1.005–1.030)
pH: 7 (ref 5.0–8.0)

## 2022-05-31 LAB — RESP PANEL BY RT-PCR (FLU A&B, COVID) ARPGX2
Influenza A by PCR: NEGATIVE
Influenza B by PCR: NEGATIVE
SARS Coronavirus 2 by RT PCR: NEGATIVE

## 2022-05-31 MED ORDER — GUAIFENESIN ER 600 MG PO TB12
1200.0000 mg | ORAL_TABLET | Freq: Two times a day (BID) | ORAL | Status: DC
Start: 2022-05-31 — End: 2022-05-31
  Administered 2022-05-31: 1200 mg via ORAL
  Filled 2022-05-31 (×3): qty 2

## 2022-05-31 MED ORDER — LACTATED RINGERS IV BOLUS
1000.0000 mL | Freq: Once | INTRAVENOUS | Status: AC
  Administered 2022-05-31: 1000 mL via INTRAVENOUS

## 2022-05-31 MED ORDER — ACETAMINOPHEN-CAFFEINE 500-65 MG PO TABS
2.0000 | ORAL_TABLET | Freq: Once | ORAL | Status: AC
Start: 2022-05-31 — End: 2022-05-31
  Administered 2022-05-31: 2 via ORAL
  Filled 2022-05-31: qty 2

## 2022-05-31 MED ORDER — GUAIFENESIN ER 600 MG PO TB12: 1200.0000 mg | ORAL_TABLET | Freq: Two times a day (BID) | ORAL | 0 refills | Status: DC

## 2022-05-31 NOTE — MAU Note (Signed)
Michelle Thompson is a 26 y.o. at [redacted]w[redacted]d here in MAU reporting: having Covid 19 symptoms, reports temp 99.0, body aches, chills, headache, and SOB.  States symptoms have worsened since yesterday. Reports took @ home Covid test, results negative.  Last took Tylenol last night @ approx 2000.  Also reports intermittent sharp pain right lower abdomen.  Denies VB or LOF.  Reports last FM last night, none today.  Onset of complaint: 2 days ago Pain score: 10/10 HA & 7/10 abd pain  Vitals:   05/31/22 1122  BP: 137/76  Pulse: (!) 125  Resp: 18  Temp: 98.1 F (36.7 C)  SpO2: 98%     MWU:[id number] Lab orders placed from triage:   UA

## 2022-05-31 NOTE — MAU Provider Note (Signed)
History     CSN: [id number]  Arrival date and time: 05/31/22 1103   Event Date/Time   First Provider Initiated Contact with Patient 05/31/22 1224      Chief Complaint  Patient presents with   Shortness of Breath   Body Aches   Headache   Chills   HPI  [name] is a 26 y.o. year old G38P2103 female at [redacted]w[redacted]d weeks gestation who presents to MAU reporting COVID-19 symptoms, temp of 99, body aches, chills, H/A (rated 10/10), and SOB. 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 She found out yesterday that the other facility has 28 (+) COVID-19 cases. She receives [facility] with [facility]. Her spouse is present and contributing to the history taking.   OB History     Gravida  4   Para  3   Term  2   Preterm  1   AB      Living  3      SAB      IAB      Ectopic      Multiple  0   Live Births  3           Past Medical History:  Diagnosis Date   Complication of anesthesia    fentanyl itching   Hypertension    Gestational Hypertension    Past Surgical History:  Procedure Laterality Date   NO PAST SURGERIES      Family History  Problem Relation Age of Onset   Lupus Mother    Healthy Father    Asthma Brother    Diabetes Maternal Grandmother    COPD Maternal Grandmother    Cancer Maternal Grandmother    Diabetes Maternal Grandfather    Stroke Maternal Grandfather     Social History   Tobacco Use   Smoking status: Never   Smokeless tobacco: Never  Vaping Use   Vaping Use: Never used  Substance Use Topics   Alcohol use: No   Drug use: No    Allergies:  Allergies  Allergen Reactions   Fentanyl Shortness Of Breath and Itching    7.11.2022 Pt reports that she was having anxiety and this was the reason for shortness of breath.    No medications prior to admission.    Review of Systems  Constitutional:  Positive for chills and fatigue.  HENT:  Positive for congestion and  rhinorrhea.   Eyes: Negative.   Respiratory:  Positive for cough (productive with yellow phlegm) and shortness of breath.   Gastrointestinal:  Negative for diarrhea, nausea and vomiting.  Endocrine: Negative.   Genitourinary:        Decreased FM today  Musculoskeletal: Negative.   Skin: Negative.   Allergic/Immunologic: Negative.   Neurological:  Positive for headaches.  Hematological: Negative.   Psychiatric/Behavioral: Negative.     Physical Exam   Patient Vitals for the past 24 hrs:  BP Temp Temp src Pulse Resp SpO2 Height Weight  05/31/22 1522 134/82 -- -- (!) 102 14 99 % -- --  05/31/22 1206 (!) 111/56 -- -- (!) 108 -- -- -- --  05/31/22 1122 137/76 98.1 F (36.7 C) Oral (!) 125 18 98 % -- --  05/31/22 1114 -- -- -- -- -- -- 5\' 2"  (1.575 m) 73.5 kg    Physical Exam Vitals and nursing note reviewed.  Constitutional:      Appearance: Normal appearance. She is normal weight.  HENT:     Head: Normocephalic and atraumatic.  Cardiovascular:     Rate and Rhythm: Tachycardia present.     Pulses: Normal pulses.     Heart sounds: Normal heart sounds.  Pulmonary:     Effort: Pulmonary effort is normal.     Breath sounds: Normal breath sounds.  Abdominal:     General: Bowel sounds are normal.     Palpations: Abdomen is soft.     Tenderness: There is no abdominal tenderness. There is no guarding or rebound.  Genitourinary:    Comments: Not indicated Musculoskeletal:        General: Normal range of motion.  Skin:    General: Skin is warm and dry.  Neurological:     Mental Status: She is alert and oriented to person, place, and time.  Psychiatric:        Mood and Affect: Mood normal.        Behavior: Behavior normal.        Thought Content: Thought content normal.        Judgment: Judgment normal.   REASSURING NST - FHR: 140 bpm / moderate variability / accels present / decels absent / TOCO: none  MAU Course  Procedures  MDM CCUA COVID-19/Flu Swab LR Bolus 1000  mL @ 999 mL/hr Excedrin Tension H/A 2 tabs -- H/A improved to 4/10 from 10/10   Results for orders placed or performed during the hospital encounter of 05/31/22 (from the past 24 hour(s))  Urinalysis, Routine w reflex microscopic Urine, Clean Catch     Status: Abnormal   Collection Time: 05/31/22 11:31 AM  Result Value Ref Range   Color, Urine YELLOW YELLOW   APPearance CLEAR CLEAR   Specific Gravity, Urine 1.008 1.005 - 1.030   pH 7.0 5.0 - 8.0   Glucose, UA NEGATIVE NEGATIVE mg/dL   Hgb urine dipstick NEGATIVE NEGATIVE   Bilirubin Urine NEGATIVE NEGATIVE   Ketones, ur NEGATIVE NEGATIVE mg/dL   Protein, ur NEGATIVE NEGATIVE mg/dL   Nitrite NEGATIVE NEGATIVE   Leukocytes,Ua MODERATE (A) NEGATIVE   RBC / HPF 0-5 0 - 5 RBC/hpf   WBC, UA 0-5 0 - 5 WBC/hpf   Bacteria, UA RARE (A) NONE SEEN   Squamous Epithelial / LPF 0-5 0 - 5  Resp Panel by RT-PCR (Flu A&B, Covid) Anterior Nasal Swab     Status: None   Collection Time: 05/31/22 12:02 PM   Specimen: Anterior Nasal Swab  Result Value Ref Range   SARS Coronavirus 2 by RT PCR NEGATIVE NEGATIVE   Influenza A by PCR NEGATIVE NEGATIVE   Influenza B by PCR NEGATIVE NEGATIVE    Assessment and Plan  Upper respiratory infection with cough and congestion  - Information provided on URI - Feeling a little better   NST (non-stress test) reactive  - Reassuring for gestational age -> FM detected on FHR tracing also  [redacted] weeks gestation of pregnancy   - Discharge patient - Keep scheduled appt with [facility] - Patient verbalized an understanding of the plan of care and agrees.   Raelyn Mora, CNM 05/31/2022, 12:24 PM

## 2022-06-14 ENCOUNTER — Inpatient Hospital Stay (HOSPITAL_COMMUNITY)
Admission: AD | Admit: 2022-06-14 | Discharge: 2022-06-14 | Disposition: A | Discharge status: Home / Self Care | Payer: Managed Care, Other (non HMO) | Attending: Obstetrics & Gynecology | Admitting: Obstetrics & Gynecology

## 2022-06-14 ENCOUNTER — Encounter: Payer: Self-pay | Admitting: Obstetrics

## 2022-06-14 ENCOUNTER — Encounter (HOSPITAL_COMMUNITY): Payer: Self-pay | Admitting: Obstetrics & Gynecology

## 2022-06-14 ENCOUNTER — Telehealth: Payer: Self-pay | Admitting: Obstetrics

## 2022-06-14 ENCOUNTER — Inpatient Hospital Stay (HOSPITAL_COMMUNITY): Payer: Managed Care, Other (non HMO)

## 2022-06-14 ENCOUNTER — Other Ambulatory Visit: Payer: Self-pay

## 2022-06-14 DIAGNOSIS — O10912 Unspecified pre-existing hypertension complicating pregnancy, second trimester: Secondary | ICD-10-CM | POA: Diagnosis not present

## 2022-06-14 DIAGNOSIS — G43909 Migraine, unspecified, not intractable, without status migrainosus: Secondary | ICD-10-CM | POA: Diagnosis present

## 2022-06-14 DIAGNOSIS — Z3A26 26 weeks gestation of pregnancy: Secondary | ICD-10-CM | POA: Diagnosis not present

## 2022-06-14 DIAGNOSIS — O09292 Supervision of pregnancy with other poor reproductive or obstetric history, second trimester: Secondary | ICD-10-CM | POA: Insufficient documentation

## 2022-06-14 DIAGNOSIS — O99352 Diseases of the nervous system complicating pregnancy, second trimester: Secondary | ICD-10-CM | POA: Diagnosis present

## 2022-06-14 DIAGNOSIS — O368120 Decreased fetal movements, second trimester, not applicable or unspecified: Secondary | ICD-10-CM | POA: Diagnosis not present

## 2022-06-14 DIAGNOSIS — R449 Unspecified symptoms and signs involving general sensations and perceptions: Secondary | ICD-10-CM

## 2022-06-14 DIAGNOSIS — O162 Unspecified maternal hypertension, second trimester: Secondary | ICD-10-CM

## 2022-06-14 LAB — URINALYSIS, ROUTINE W REFLEX MICROSCOPIC
Bilirubin Urine: NEGATIVE
Glucose, UA: NEGATIVE mg/dL
Hgb urine dipstick: NEGATIVE
Ketones, ur: 20 mg/dL — AB
Nitrite: NEGATIVE
Protein, ur: NEGATIVE mg/dL
Specific Gravity, Urine: 1.012 (ref 1.005–1.030)
pH: 6 (ref 5.0–8.0)

## 2022-06-14 LAB — CBC
HCT: 33.7 % — ABNORMAL LOW (ref 36.0–46.0)
Hemoglobin: 10.9 g/dL — ABNORMAL LOW (ref 12.0–15.0)
MCH: 23.1 pg — ABNORMAL LOW (ref 26.0–34.0)
MCHC: 32.3 g/dL (ref 30.0–36.0)
MCV: 71.5 fL — ABNORMAL LOW (ref 80.0–100.0)
Platelets: 161 10*3/uL (ref 150–400)
RBC: 4.71 MIL/uL (ref 3.87–5.11)
RDW: 13.6 % (ref 11.5–15.5)
WBC: 10.2 10*3/uL (ref 4.0–10.5)
nRBC: 0 % (ref 0.0–0.2)

## 2022-06-14 LAB — COMPREHENSIVE METABOLIC PANEL
ALT: 11 U/L (ref 0–44)
AST: 16 U/L (ref 15–41)
Albumin: 3 g/dL — ABNORMAL LOW (ref 3.5–5.0)
Alkaline Phosphatase: 89 U/L (ref 38–126)
Anion gap: 9 (ref 5–15)
BUN: 6 mg/dL (ref 6–20)
CO2: 20 mmol/L — ABNORMAL LOW (ref 22–32)
Calcium: 9.2 mg/dL (ref 8.9–10.3)
Chloride: 105 mmol/L (ref 98–111)
Creatinine, Ser: 0.9 mg/dL (ref 0.44–1.00)
GFR, Estimated: >60 mL/min (ref >60)
Glucose, Bld: 82 mg/dL (ref 70–99)
Potassium: 4 mmol/L (ref 3.5–5.1)
Sodium: 134 mmol/L — ABNORMAL LOW (ref 135–145)
Total Bilirubin: 0.4 mg/dL (ref 0.3–1.2)
Total Protein: 6.7 g/dL (ref 6.5–8.1)

## 2022-06-14 LAB — PROTEIN / CREATININE RATIO, URINE
Creatinine, Urine: 82 mg/dL
Protein Creatinine Ratio: 0.13 mg/mg{Cre} (ref 0.00–0.15)
Total Protein, Urine: 11 mg/dL

## 2022-06-14 MED ORDER — CAFFEINE 200 MG PO TABS
200.0000 mg | ORAL_TABLET | Freq: Once | ORAL | Status: AC
Start: 2022-06-14 — End: 2022-06-14
  Administered 2022-06-14: 200 mg via ORAL
  Filled 2022-06-14: qty 1

## 2022-06-14 MED ORDER — METOCLOPRAMIDE HCL 5 MG/ML IJ SOLN
10.0000 mg | Freq: Once | INTRAMUSCULAR | Status: AC
  Administered 2022-06-14: 10 mg via INTRAVENOUS
  Filled 2022-06-14: qty 2

## 2022-06-14 MED ORDER — DIPHENHYDRAMINE HCL 50 MG/ML IJ SOLN
25.0000 mg | Freq: Once | INTRAMUSCULAR | Status: AC
  Administered 2022-06-14: 25 mg via INTRAVENOUS
  Filled 2022-06-14: qty 1

## 2022-06-14 NOTE — Telephone Encounter (Signed)
Pt called and states that the right side of her body from the top of her head to the bottom of her feet really hurt. States that the left side feels fine. Pt states that she has been feeling this way since around 7am. Patient states that her pain level is at a 10. Patient states her last fetal movement was felt before she got off work at [employer] and has not felt baby move since then. Spoke with CMA during telephone call with patient and was told to advise the patient to visit the ER to be checked out. Please advise

## 2022-06-14 NOTE — MAU Provider Note (Signed)
History     CSN: [id number]  Arrival date and time: 06/14/22 1731   Chief Complaint  Patient presents with   Headache    Michelle Thompson is a 26 yo female G4P2103 at [redacted]w[redacted]d presenting for evaluation of a severe headache and elevated blood pressures.   She reports this headache started yesterday afternoon.  Describes as severe and the worst headache that she is at work, feels like a "dull ache" on the right side of her head and radiates down to her right jaw.  This has been associated with lightheadedness, nausea, decreased sensation of the right side of her body including leg/arm with subjective weakness/discomfort in these areas as well.  Denies any paresthesias/tingling, vision changes, rash, syncope, fever, or fall/trauma.  Has been limping due to this discomfort on the right side.  Headache and right-sided changes have been unchanged since onset yesterday.  She took Tylenol about 2 hours ago without relief of the headache. She reports a history of headaches in the past, but "not this bad".   Established care in [place] at 20 weeks.  Initial BP was slightly elevated (130/80's) but under 140/90.  States her BP was in the 150 systolic today at home.  Denies known history of chronic hypertension, but does have a history of gestational hypertension in a previous pregnancy.  She additionally reports decreased fetal movement compared to usual. She has felt movement a few times since arrival.   Past Medical History:  Diagnosis Date   Complication of anesthesia    fentanyl itching   Hypertension    Gestational Hypertension    Past Surgical History:  Procedure Laterality Date   NO PAST SURGERIES      Family History  Problem Relation Age of Onset   Lupus Mother    Healthy Father    Asthma Brother    Diabetes Maternal Grandmother    COPD Maternal Grandmother    Cancer Maternal Grandmother    Diabetes Maternal Grandfather    Stroke Maternal Grandfather     Social History   Tobacco  Use   Smoking status: Never   Smokeless tobacco: Never  Vaping Use   Vaping Use: Never used  Substance Use Topics   Alcohol use: No   Drug use: No    Allergies:  Allergies  Allergen Reactions   Fentanyl Shortness Of Breath and Itching    7.11.2022 Pt reports that she was having anxiety and this was the reason for shortness of breath.    Medications Prior to Admission  Medication Sig Dispense Refill Last Dose   acetaminophen (TYLENOL) 500 MG tablet Take 1,000 mg by mouth every 6 (six) hours as needed.   06/14/2022   guaiFENesin (MUCINEX) 600 MG 12 hr tablet Take 2 tablets (1,200 mg total) by mouth 2 (two) times daily. 30 tablet 0 Past Week   prenatal vitamin w/FE, FA (PRENATAL 1 + 1) 27-1 MG TABS tablet Take 1 tablet by mouth daily at 12 noon.   06/14/2022   valACYclovir (VALTREX) 500 MG tablet Take 1 tablet (500 mg total) by mouth 2 (two) times daily. 30 tablet 3 Past Week   aspirin EC 81 MG tablet Take 1 tablet (81 mg total) by mouth daily. Swallow whole. 30 tablet 5    promethazine (PHENERGAN) 25 MG tablet Take 1 tablet (25 mg total) by mouth every 6 (six) hours as needed for nausea or vomiting. 30 tablet 5 More than a month    Review of Systems  Constitutional:  Negative for  appetite change, fatigue and fever.  Eyes:  Negative for visual disturbance.  Respiratory:  Negative for shortness of breath.   Cardiovascular:  Negative for chest pain.  Gastrointestinal:  Positive for nausea and vomiting. Negative for abdominal pain, constipation and diarrhea.  Genitourinary:  Negative for dysuria and vaginal bleeding.  Musculoskeletal:  Negative for neck pain and neck stiffness.  Skin:  Negative for rash.  Neurological:  Positive for light-headedness and headaches. Negative for seizures, syncope, speech difficulty and numbness.  Psychiatric/Behavioral:  Negative for behavioral problems and confusion.    Physical Exam   Blood pressure 139/81, pulse (!) 110, temperature 99 F (37.2 C),  resp. rate 12, last menstrual period 12/09/2021, SpO2 99 %, not currently breastfeeding.  Physical Exam Constitutional:      General: She is not in acute distress.    Appearance: She is well-developed. She is not ill-appearing or toxic-appearing.  HENT:     Head: Normocephalic and atraumatic.     Mouth/Throat:     Mouth: Mucous membranes are moist.  Eyes:     General: No visual field deficit.    Extraocular Movements: Extraocular movements intact.     Pupils: Pupils are equal, round, and reactive to light.  Cardiovascular:     Rate and Rhythm: Normal rate.  Pulmonary:     Effort: Pulmonary effort is normal.  Abdominal:     Palpations: Abdomen is soft.     Comments: Gravid appropriate for gestational age   Musculoskeletal:     Cervical back: Normal range of motion and neck supple.  Skin:    General: Skin is warm.     Capillary Refill: Capillary refill takes less than 2 seconds.  Neurological:     Mental Status: She is alert.     Comments: CN 2-12 intact. EOMI, PERRL.  Smile symmetrical and speech normal. Engages in regular conversation and able to follow complex commands without difficulty. Reports decreased sensation of entire right leg and arm in comparison to left. 5/5 upper and lower extremity strength, but patient perceived weakness with movements. No pronator drift.    Psychiatric:        Mood and Affect: Mood is anxious.    NST: Appropriate for gestational age.  Baseline 140 bpm Moderate variability  Few 10x10 accelerations  No decelerations No contractions on toco   MAU Course   MDM Mild range blood pressures: CBC, CMP, P/cr ratio  Severe HA with right sided sensory deficits and perceived decreased strength (objectively without), obtain MRI/MRV brain/head.   2000: Returned from MRI. MRI/MRV without acute abnormality. Headache still feels about the same, 10/10. Suspect complex migraine. Will do IV reglan, benadryl, and oral caffeine and reassess.   2100: Reports  HA is significantly better, rates 2/10. Still feels like her right side feels sore/off. Still endorses feeling less movement, given snack and doing kick counts.  2200: Checked in again, feeling fetal movement off and on. Numerous fetal movements felt by RN with re-adjustment of toco, frequently changing toco position. Reports HA has resolved, but right side still feels "just sore and weak". Re-examined with 5/5 strength upper and lower. Still reports perceived R sided decreased sensation compared to left. Gait is stable without assistance, reports soreness with the movement.   Discussed with Neurology, Dr. Otelia Limes. Reports that often with a suspected complex migraine (or other unexplained symptoms), that this may linger over the next day or so and allow time/reassurance. Encouraged staying in a dark room, cold vs warm wash cloth, and  rest in the time being.    Assessment and Plan   1. Migraine syndrome 2. Sensory deficit, right Suspect presentation d/t complex migraine. MRI/MRV reassuring without abnormality. Strength intact on exam. HA resolved after medication, however R sided sensory/soreness abnormalities persisted. After discussion with Neurology, recommended giving this time, as it may linger through tomorrow. Encouraged additional supportive care as above and follow up/call if no improvement by tomorrow afternoon/evening.   3. [redacted] weeks gestation of pregnancy  4. Decreased fetal movements in second trimester, single or unspecified fetus NST reassuring for gestational age. Several movements felt by patient while in MAU with movement noted frequently by RN. Set expectations for DFM at gestational age, encouraged continued monitoring at home (especially off benadryl that was given here etc).   5. Elevated blood pressure affecting pregnancy in second trimester, antepartum Mild range only. ?Chronic vs early gestational, as elevated (<140/90) on initial prenatal visit at 20 weeks. gHTN in a  previous pregnancy. Pre-e labs/urine WNL. Encouraged monitoring BP at home in the interim.    Currently has OB fu on 6/22, instructed to call OB office tomorrow to see if she can be seen sooner. Additionally sent a message to [facility], CNM that she has seen in the office.   MAU return precautions strictly discussed. Discharged home in stable condition.    Allayne Stack 06/14/2022, 6:12 PM

## 2022-06-14 NOTE — Discharge Instructions (Signed)
Please call your OB office tomorrow for follow up. I sent a message to the office as well.   Stay in a quiet room (try to avoid technology/lights such as tv/computer/phones), cool or warm wash cloth, plenty of water/hydration, and tylenol every 6-8 hours as needed (650-1000mg ). Hopefully this sensation should improve by tomorrow afternoon. If still present, please call and check in with your provider or return to MAU if worsening, BP consistently >160/110, HA returns severe, leakage of fluid, vaginal bleeding, or any other concern.

## 2022-06-14 NOTE — MAU Note (Addendum)
.  Michelle Thompson is a 26 y.o. at [redacted]w[redacted]d here in MAU reporting: Pt reports she has a right sided headache that started yesterday. Pt reports pain on her right side of her body. Pt has not felt the baby move since 0700-0800. Pt also reports massaging her head and noticed a difference in how it felt on the side being massaged.   Denies vaginal bleeding or LOF.   Onset of complaint: yesterday  Pain score: 10/10 headache pt reports taking 650 mg of tylenol within the last 2 hour with no relief There were no vitals filed for this visit.   Lab orders placed from triage:   none

## 2022-06-14 NOTE — Telephone Encounter (Signed)
I agree that she should be evaluated in the ED.

## 2022-06-14 NOTE — Telephone Encounter (Signed)
Called and spoke with patient to follow up on advised ER visit. Patient states that she just checked her blood pressure and got a reading of 150/94, also stated that she was about to leave home and head to the emergency room.

## 2022-06-21 ENCOUNTER — Ambulatory Visit (INDEPENDENT_AMBULATORY_CARE_PROVIDER_SITE_OTHER): Payer: Managed Care, Other (non HMO) | Admitting: Obstetrics

## 2022-06-21 ENCOUNTER — Encounter: Payer: Self-pay | Admitting: Obstetrics

## 2022-06-21 VITALS — BP 130/82 | HR 101 | Wt 164.0 lb

## 2022-06-21 DIAGNOSIS — G43809 Other migraine, not intractable, without status migrainosus: Secondary | ICD-10-CM

## 2022-06-21 DIAGNOSIS — Z3A27 27 weeks gestation of pregnancy: Secondary | ICD-10-CM

## 2022-06-21 LAB — POCT URINALYSIS DIPSTICK OB
Bilirubin, UA: NEGATIVE
Blood, UA: NEGATIVE
Glucose, UA: NEGATIVE
Ketones, UA: NEGATIVE
Leukocytes, UA: NEGATIVE
Nitrite, UA: NEGATIVE
POC,PROTEIN,UA: NEGATIVE
Spec Grav, UA: 1.015 (ref 1.010–1.025)
Urobilinogen, UA: 0.2 E.U./dL
pH, UA: 6.5 (ref 5.0–8.0)

## 2022-06-21 MED ORDER — TETANUS-DIPHTH-ACELL PERTUSSIS 5-2.5-18.5 LF-MCG/0.5 IM SUSY
0.5000 mL | PREFILLED_SYRINGE | Freq: Once | INTRAMUSCULAR | Status: AC
  Administered 2022-06-21: 0.5 mL via INTRAMUSCULAR

## 2022-06-21 NOTE — Progress Notes (Addendum)
ROB at [redacted]w[redacted]d. Active baby. Was seen in the MAU recently for complex migraine that involved severe headache and right-sided pain throughout her body. She had elevated BP at that time and normal labs. Her initial BP today was elevated but repeat was normal. She has had a few episodes of unexpected lightheadedness, one of which happened when she was driving. She denies HA, visual changes, and RUQ pain.Referral to neurology ordered. Torin plans BTL; will sign consent at next visit. Briefly reviewed making a birth plan. She would like an epidural. Plans to breastfeed. TDaP, BTC, RSB done today. Will come back in AM for glucose, CBC, and RPR. ROB in 2 weeks.  Michelle Thompson Spanish, CNM

## 2022-06-22 ENCOUNTER — Other Ambulatory Visit: Payer: Managed Care, Other (non HMO)

## 2022-06-25 ENCOUNTER — Encounter: Payer: Self-pay | Admitting: Obstetrics and Gynecology

## 2022-06-25 ENCOUNTER — Observation Stay
Admission: EM | Admit: 2022-06-25 | Discharge: 2022-06-25 | Disposition: A | Discharge status: Home / Self Care | Payer: Managed Care, Other (non HMO) | Attending: Certified Nurse Midwife | Admitting: Certified Nurse Midwife

## 2022-06-25 DIAGNOSIS — Z3A28 28 weeks gestation of pregnancy: Secondary | ICD-10-CM | POA: Diagnosis not present

## 2022-06-25 DIAGNOSIS — Z7982 Long term (current) use of aspirin: Secondary | ICD-10-CM | POA: Diagnosis not present

## 2022-06-25 DIAGNOSIS — O163 Unspecified maternal hypertension, third trimester: Secondary | ICD-10-CM

## 2022-06-25 DIAGNOSIS — Z79899 Other long term (current) drug therapy: Secondary | ICD-10-CM | POA: Diagnosis not present

## 2022-06-25 LAB — COMPREHENSIVE METABOLIC PANEL
ALT: 9 U/L (ref 0–44)
AST: 17 U/L (ref 15–41)
Albumin: 3.2 g/dL — ABNORMAL LOW (ref 3.5–5.0)
Alkaline Phosphatase: 94 U/L (ref 38–126)
Anion gap: 7 (ref 5–15)
BUN: 8 mg/dL (ref 6–20)
CO2: 20 mmol/L — ABNORMAL LOW (ref 22–32)
Calcium: 9.1 mg/dL (ref 8.9–10.3)
Chloride: 107 mmol/L (ref 98–111)
Creatinine, Ser: 0.4 mg/dL — ABNORMAL LOW (ref 0.44–1.00)
GFR, Estimated: >60 mL/min (ref >60)
Glucose, Bld: 87 mg/dL (ref 70–99)
Potassium: 3.8 mmol/L (ref 3.5–5.1)
Sodium: 134 mmol/L — ABNORMAL LOW (ref 135–145)
Total Bilirubin: 0.3 mg/dL (ref 0.3–1.2)
Total Protein: 7.1 g/dL (ref 6.5–8.1)

## 2022-06-25 LAB — CBC
HCT: 33.8 % — ABNORMAL LOW (ref 36.0–46.0)
Hemoglobin: 10.6 g/dL — ABNORMAL LOW (ref 12.0–15.0)
MCH: 22.2 pg — ABNORMAL LOW (ref 26.0–34.0)
MCHC: 31.4 g/dL (ref 30.0–36.0)
MCV: 70.7 fL — ABNORMAL LOW (ref 80.0–100.0)
Platelets: 178 10*3/uL (ref 150–400)
RBC: 4.78 MIL/uL (ref 3.87–5.11)
RDW: 13.3 % (ref 11.5–15.5)
WBC: 9 10*3/uL (ref 4.0–10.5)
nRBC: 0 % (ref 0.0–0.2)

## 2022-06-25 LAB — PROTEIN / CREATININE RATIO, URINE
Creatinine, Urine: 83 mg/dL
Protein Creatinine Ratio: 0.2 mg/mg{Cre} — ABNORMAL HIGH (ref 0.00–0.15)
Total Protein, Urine: 17 mg/dL

## 2022-06-25 NOTE — ED Notes (Signed)
Pt wishes to go to parking lot to speak on the phone prior to being seen.

## 2022-06-25 NOTE — OB Triage Note (Signed)
Patient Discharged home per provider. Pt educated about hypertension in pregnancy and red flag symptoms. Pt instructed to keep all follow up appointments with her provider. AVS given to patient and RN answered all questions and patient has no further questions at this time. Pt discharged home in stable condition.

## 2022-06-28 ENCOUNTER — Encounter: Payer: Managed Care, Other (non HMO) | Admitting: Certified Nurse Midwife

## 2022-07-11 ENCOUNTER — Other Ambulatory Visit: Payer: Managed Care, Other (non HMO)

## 2022-07-12 ENCOUNTER — Encounter: Payer: Self-pay | Admitting: Obstetrics

## 2022-07-12 ENCOUNTER — Ambulatory Visit (INDEPENDENT_AMBULATORY_CARE_PROVIDER_SITE_OTHER): Payer: Managed Care, Other (non HMO) | Admitting: Obstetrics

## 2022-07-12 ENCOUNTER — Other Ambulatory Visit: Payer: Managed Care, Other (non HMO)

## 2022-07-12 VITALS — BP 144/94 | HR 106 | Wt 164.4 lb

## 2022-07-12 DIAGNOSIS — O0993 Supervision of high risk pregnancy, unspecified, third trimester: Secondary | ICD-10-CM

## 2022-07-12 DIAGNOSIS — O163 Unspecified maternal hypertension, third trimester: Secondary | ICD-10-CM

## 2022-07-12 DIAGNOSIS — Z3A27 27 weeks gestation of pregnancy: Secondary | ICD-10-CM

## 2022-07-12 NOTE — Progress Notes (Signed)
ROB at [redacted]w[redacted]d. Active baby. Denies ctx, LOF, vaginal bleeding. Her blood pressure is elevated today; she denies HA, blurry vision, and RUQ pain. She has not had any further migraines. 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 Note given. Discussed breastfeeding, making a birth plan. BTL consent signed. Smrithi has been taking Valtrex 500 mg BID daily for HSV suppression but reports that she is still having painful outbreaks about every 3 months. Will consult with MD. 1-hour glucose, CBC, CMP, RPR today. RTC in 2 weeks.  Guadlupe Spanish, CNM

## 2022-07-13 ENCOUNTER — Encounter: Payer: Self-pay | Admitting: Obstetrics

## 2022-07-13 ENCOUNTER — Other Ambulatory Visit: Payer: Self-pay | Admitting: Obstetrics

## 2022-07-13 LAB — CBC
Hematocrit: 30.5 % — ABNORMAL LOW (ref 34.0–46.6)
Hemoglobin: 10 g/dL — ABNORMAL LOW (ref 11.1–15.9)
MCH: 22.4 pg — ABNORMAL LOW (ref 26.6–33.0)
MCHC: 32.8 g/dL (ref 31.5–35.7)
MCV: 68 fL — ABNORMAL LOW (ref 79–97)
Platelets: 213 10*3/uL (ref 150–450)
RBC: 4.47 x10E6/uL (ref 3.77–5.28)
RDW: 14.2 % (ref 11.7–15.4)
WBC: 7.8 10*3/uL (ref 3.4–10.8)

## 2022-07-13 LAB — RPR: RPR Ser Ql: NONREACTIVE

## 2022-07-13 LAB — GLUCOSE TOLERANCE, 1 HOUR: Glucose, 1Hr PP: 111 mg/dL (ref 70–199)

## 2022-07-13 MED ORDER — ACYCLOVIR 400 MG PO TABS: 400.0000 mg | ORAL_TABLET | Freq: Three times a day (TID) | ORAL | 3 refills | Status: DC

## 2022-07-13 NOTE — Progress Notes (Signed)
Consulted with Dr. Valentino Saxon regarding Michelle Thompson's HSV outbreaks despite suppression. She recommended switching suppressive therapy to Acyclovir. Rx sent to pharmacy. [name] notified via MyChart.  M. Chryl Heck, CNM

## 2022-07-14 LAB — COMPREHENSIVE METABOLIC PANEL
ALT: 6 IU/L (ref 0–32)
AST: 14 IU/L (ref 0–40)
Albumin/Globulin Ratio: 1.1 — ABNORMAL LOW (ref 1.2–2.2)
Albumin: 3.3 g/dL — ABNORMAL LOW (ref 4.0–5.0)
Alkaline Phosphatase: 132 IU/L — ABNORMAL HIGH (ref 44–121)
BUN/Creatinine Ratio: 13 (ref 9–23)
BUN: 8 mg/dL (ref 6–20)
Bilirubin Total: <0.2 mg/dL (ref 0.0–1.2)
CO2: 16 mmol/L — ABNORMAL LOW (ref 20–29)
Calcium: 8.7 mg/dL (ref 8.7–10.2)
Chloride: 102 mmol/L (ref 96–106)
Creatinine, Ser: 0.64 mg/dL (ref 0.57–1.00)
Globulin, Total: 2.9 g/dL (ref 1.5–4.5)
Glucose: 111 mg/dL — ABNORMAL HIGH (ref 70–99)
Potassium: 3.8 mmol/L (ref 3.5–5.2)
Sodium: 135 mmol/L (ref 134–144)
Total Protein: 6.2 g/dL (ref 6.0–8.5)
eGFR: 126 mL/min/{1.73_m2} (ref >59)

## 2022-07-14 LAB — SPECIMEN STATUS REPORT

## 2022-07-16 ENCOUNTER — Encounter: Payer: Self-pay | Admitting: Obstetrics

## 2022-07-16 ENCOUNTER — Other Ambulatory Visit: Payer: Self-pay | Admitting: Obstetrics

## 2022-07-16 MED ORDER — FUSION PLUS PO CAPS: 30.0000 | ORAL_CAPSULE | Freq: Every day | ORAL | 3 refills | Status: DC

## 2022-07-25 ENCOUNTER — Other Ambulatory Visit: Payer: Self-pay

## 2022-07-25 ENCOUNTER — Telehealth: Payer: Self-pay | Admitting: Obstetrics

## 2022-07-25 MED ORDER — FUSION PLUS PO CAPS: 30.0000 | ORAL_CAPSULE | Freq: Every day | ORAL | 3 refills | Status: DC

## 2022-07-25 NOTE — Telephone Encounter (Signed)
While calling to confirm apt for tomorrow- pt made me aware that she has been trying to get in touch with [facility] as the Rx that was sent in the pharamacy said her insurance would not cover- come to find out her name had been updated in Epic system but her insurance is still in maiden name- I hjave updated her chart to her maiden name as it should match insurance information- she is requesting we resend Rx please.

## 2022-07-25 NOTE — Telephone Encounter (Signed)
Iron medication was resent with correct name on 07/25/2022 sent patient mchart letting her know we have fixed RX

## 2022-07-26 ENCOUNTER — Encounter: Payer: Managed Care, Other (non HMO) | Admitting: Obstetrics and Gynecology

## 2022-07-27 ENCOUNTER — Encounter (HOSPITAL_COMMUNITY): Payer: Self-pay | Admitting: Obstetrics and Gynecology

## 2022-07-27 ENCOUNTER — Inpatient Hospital Stay (HOSPITAL_COMMUNITY)
Admission: AD | Admit: 2022-07-27 | Discharge: 2022-07-27 | Disposition: A | Discharge status: Home / Self Care | Payer: Managed Care, Other (non HMO) | Attending: Obstetrics and Gynecology | Admitting: Obstetrics and Gynecology

## 2022-07-27 ENCOUNTER — Other Ambulatory Visit: Payer: Self-pay

## 2022-07-27 DIAGNOSIS — O98513 Other viral diseases complicating pregnancy, third trimester: Secondary | ICD-10-CM | POA: Diagnosis not present

## 2022-07-27 DIAGNOSIS — Z79899 Other long term (current) drug therapy: Secondary | ICD-10-CM | POA: Diagnosis not present

## 2022-07-27 DIAGNOSIS — O23593 Infection of other part of genital tract in pregnancy, third trimester: Secondary | ICD-10-CM | POA: Insufficient documentation

## 2022-07-27 DIAGNOSIS — B009 Herpesviral infection, unspecified: Secondary | ICD-10-CM | POA: Insufficient documentation

## 2022-07-27 DIAGNOSIS — Z3A32 32 weeks gestation of pregnancy: Secondary | ICD-10-CM | POA: Diagnosis not present

## 2022-07-27 DIAGNOSIS — B9689 Other specified bacterial agents as the cause of diseases classified elsewhere: Secondary | ICD-10-CM | POA: Insufficient documentation

## 2022-07-27 DIAGNOSIS — O4703 False labor before 37 completed weeks of gestation, third trimester: Secondary | ICD-10-CM | POA: Insufficient documentation

## 2022-07-27 DIAGNOSIS — O479 False labor, unspecified: Secondary | ICD-10-CM | POA: Diagnosis not present

## 2022-07-27 DIAGNOSIS — Z3A33 33 weeks gestation of pregnancy: Secondary | ICD-10-CM | POA: Insufficient documentation

## 2022-07-27 LAB — URINALYSIS, ROUTINE W REFLEX MICROSCOPIC
Bilirubin Urine: NEGATIVE
Glucose, UA: NEGATIVE mg/dL
Hgb urine dipstick: NEGATIVE
Ketones, ur: NEGATIVE mg/dL
Nitrite: NEGATIVE
Protein, ur: 30 mg/dL — AB
Specific Gravity, Urine: 1.016 (ref 1.005–1.030)
pH: 6 (ref 5.0–8.0)

## 2022-07-27 LAB — WET PREP, GENITAL
Sperm: NONE SEEN
Trich, Wet Prep: NONE SEEN
WBC, Wet Prep HPF POC: <10 (ref <10)
Yeast Wet Prep HPF POC: NONE SEEN

## 2022-07-27 LAB — AMNISURE RUPTURE OF MEMBRANE (ROM) NOT AT ARMC: Amnisure ROM: NEGATIVE

## 2022-07-27 LAB — FETAL FIBRONECTIN: Fetal Fibronectin: NEGATIVE

## 2022-07-27 MED ORDER — LIDOCAINE HCL URETHRAL/MUCOSAL 2 % EX GEL
1.0000 | Freq: Once | CUTANEOUS | Status: AC
  Administered 2022-07-27: 1 via TOPICAL
  Filled 2022-07-27: qty 6

## 2022-07-27 MED ORDER — NIFEDIPINE 10 MG PO CAPS
10.0000 mg | ORAL_CAPSULE | ORAL | Status: AC
  Administered 2022-07-27 (×2): 10 mg via ORAL
  Filled 2022-07-27 (×2): qty 1

## 2022-07-27 MED ORDER — METRONIDAZOLE 500 MG PO TABS: 500.0000 mg | ORAL_TABLET | Freq: Two times a day (BID) | ORAL | 0 refills | Status: AC

## 2022-07-27 NOTE — MAU Note (Addendum)
Pt says - [employer] - in [employer] in [place]- works in in [employer] - started at 10- while at work .  Feet swollen Has a HSV outbreak now - takes med daily

## 2022-07-27 NOTE — MAU Note (Signed)
RN went to d/c pt. Pt stating she is still feeling occasional contractions and feeling "uneasy" since taking the procardia.Marland Kitchen states she feels like her heart is racing. Pt questioning when the feeling would go away. RN informed pt I would talk to the provider and would be back.Marland Kitchen  23 J. Rasch, NP notified of the encounter with the pt and reviewed the following vital signs: BP 105/49 P 118  NP told RN to inform the pt that the effects from the procardia should wear off within an hour and to offer the pt to stay until then, with an option of IV fluids.   RN re-entered pt's room and informed pt that the effects should go away within an hour from the medication. RN also offered pt to stay until she is feeling better with the option of IV fluids. Pt denied. Pt stated "I'm fine. I'll go home".   RN reviewed discharge instructions with pt and advised her on when to come back to the hospital. Pt verbalized understanding.

## 2022-07-27 NOTE — MAU Provider Note (Signed)
History     CSN: [id number]  Arrival date and time: 07/27/22 0054   Event Date/Time   First Provider Initiated Contact with Patient 07/27/22 0141      Chief Complaint  Patient presents with   Contractions   HPI  Michelle Thompson is a 26 y.o. female [phone] @ 22w2dhere in MAU with complaints of contractions and vaginal discharge. The contractions started at 10 pm. She reports 1 episode of fluid leaking, white in color. She has not taken anything for the pain. The contractions are sporadic. She currently is having an HSV outbreak. She has been taking acyclovir 3x per day.   Hx of chronic HTN, no HA, vision changes. No on BP medication. Taking BASA daily.   OB History     Gravida  4   Para  3   Term  2   Preterm  1   AB      Living  3      SAB      IAB      Ectopic      Multiple  0   Live Births  3           Past Medical History:  Diagnosis Date   Complication of anesthesia    fentanyl itching   Hypertension    Gestational Hypertension    Past Surgical History:  Procedure Laterality Date   NO PAST SURGERIES      Family History  Problem Relation Age of Onset   Lupus Mother    Healthy Father    Asthma Brother    Diabetes Maternal Grandmother    COPD Maternal Grandmother    Cancer Maternal Grandmother    Diabetes Maternal Grandfather    Stroke Maternal Grandfather     Social History   Tobacco Use   Smoking status: Never   Smokeless tobacco: Never  Vaping Use   Vaping Use: Never used  Substance Use Topics   Alcohol use: No   Drug use: No    Allergies:  Allergies  Allergen Reactions   Fentanyl Shortness Of Breath and Itching    7.11.2022 Pt reports that she was having anxiety and this was the reason for shortness of breath.    Medications Prior to Admission  Medication Sig Dispense Refill Last Dose   acyclovir (ZOVIRAX) 400 MG tablet Take 1 tablet (400 mg total) by mouth 3 (three) times daily. 90 tablet 3 07/27/2022    aspirin EC 81 MG tablet Take 1 tablet (81 mg total) by mouth daily. Swallow whole. 30 tablet 5 07/27/2022   Iron-FA-B Cmp-C-Biot-Probiotic (FUSION PLUS) CAPS Take 30 tablets by mouth daily. 30 capsule 3 07/27/2022   prenatal vitamin w/FE, FA (PRENATAL 1 + 1) 27-1 MG TABS tablet Take 1 tablet by mouth daily at 12 noon.   07/27/2022   acetaminophen (TYLENOL) 500 MG tablet Take 1,000 mg by mouth every 6 (six) hours as needed.   More than a month   guaiFENesin (MUCINEX) 600 MG 12 hr tablet Take 2 tablets (1,200 mg total) by mouth 2 (two) times daily. (Patient not taking: Reported on 06/25/2022) 30 tablet 0    promethazine (PHENERGAN) 25 MG tablet Take 1 tablet (25 mg total) by mouth every 6 (six) hours as needed for nausea or vomiting. (Patient not taking: Reported on 06/25/2022) 30 tablet 5    Recent Results (from the past 2160 hour(s))  POC Urinalysis Dipstick OB     Status: Normal   Collection Time: 05/03/22  11:01 AM  Result Value Ref Range   Color, UA     Clarity, UA     Glucose, UA Negative Negative   Bilirubin, UA neg    Ketones, UA neg    Spec Grav, UA 1.010 1.010 - 1.025   Blood, UA neg    pH, UA 8.0 5.0 - 8.0   POC,PROTEIN,UA Negative Negative, Trace, Small (1+), Moderate (2+), Large (3+), 4+   Urobilinogen, UA 0.2 0.2 or 1.0 E.U./dL   Nitrite, UA neg    Leukocytes, UA Negative Negative   Appearance     Odor    Cytology - PAP     Status: None   Collection Time: 05/03/22 11:37 AM  Result Value Ref Range   High risk HPV Negative    Adequacy Satisfactory for evaluation.  The absence of an    Adequacy      endocervical/transformation zone component is not uncommon in pregnant   Adequacy patients.    Diagnosis      - Negative for intraepithelial lesion or malignancy (NILM)   Comment Normal Reference Range HPV - Negative   Cervicovaginal ancillary only     Status: None   Collection Time: 05/03/22 11:37 AM  Result Value Ref Range   Neisseria Gonorrhea Negative    Chlamydia Negative     Trichomonas Negative    Bacterial Vaginitis (gardnerella) Negative    Candida Vaginitis Negative    Candida Glabrata Negative    Comment      Normal Reference Range Bacterial Vaginosis - Negative   Comment Normal Reference Range Candida Species - Negative    Comment Normal Reference Range Candida Galbrata - Negative    Comment Normal Reference Range Trichomonas - Negative    Comment Normal Reference Ranger Chlamydia - Negative    Comment      Normal Reference Range Neisseria Gonorrhea - Negative  TSH Pregnancy     Status: None   Collection Time: 05/03/22 12:13 PM  Result Value Ref Range   TSH Pregnancy 0.589 0.450 - 4.500 uIU/mL    Comment:                              Reference Interval                    PREGNANCY:                    First Trimester        0.100 - 4.000                    Second Trimester       0.200 - 4.000                    Third Trimester        0.300 - 4.000                    Non-Pregnant Adult     0.450 - 4.500   Comp Met (CMET)     Status: Abnormal   Collection Time: 05/03/22 12:13 PM  Result Value Ref Range   Glucose 107 (H) 70 - 99 mg/dL   BUN 7 6 - 20 mg/dL   Creatinine, Ser 0.69 0.57 - 1.00 mg/dL   eGFR 125 >59 mL/min/1.73   BUN/Creatinine Ratio 10 9 - 23   Sodium 136 134 - 144 mmol/L  Potassium 4.1 3.5 - 5.2 mmol/L   Chloride 102 96 - 106 mmol/L   CO2 20 20 - 29 mmol/L   Calcium 8.9 8.7 - 10.2 mg/dL   Total Protein 6.6 6.0 - 8.5 g/dL   Albumin 3.6 (L) 3.9 - 5.0 g/dL   Globulin, Total 3.0 1.5 - 4.5 g/dL   Albumin/Globulin Ratio 1.2 1.2 - 2.2   Bilirubin Total 0.2 0.0 - 1.2 mg/dL   Alkaline Phosphatase 89 44 - 121 IU/L   AST 10 0 - 40 IU/L   ALT 7 0 - 32 IU/L  CBC     Status: Abnormal   Collection Time: 05/03/22 12:13 PM  Result Value Ref Range   WBC 9.4 3.4 - 10.8 x10E3/uL   RBC 4.89 3.77 - 5.28 x10E6/uL   Hemoglobin 11.2 11.1 - 15.9 g/dL   Hematocrit 34.5 34.0 - 46.6 %   MCV 71 (L) 79 - 97 fL   MCH 22.9 (L) 26.6 - 33.0 pg   MCHC  32.5 31.5 - 35.7 g/dL   RDW 14.0 11.7 - 15.4 %   Platelets 231 150 - 450 x10E3/uL  Protein / Creatinine Ratio, Urine     Status: Abnormal   Collection Time: 05/03/22 12:19 PM  Result Value Ref Range   Creatinine, Urine 99.0 Not Estab. mg/dL   Protein, Ur 29.2 Not Estab. mg/dL   Protein/Creat Ratio 295 (H) 0 - 200 mg/g creat  Urinalysis, Routine w reflex microscopic Urine, Clean Catch     Status: Abnormal   Collection Time: 05/31/22 11:31 AM  Result Value Ref Range   Color, Urine YELLOW YELLOW   APPearance CLEAR CLEAR   Specific Gravity, Urine 1.008 1.005 - 1.030   pH 7.0 5.0 - 8.0   Glucose, UA NEGATIVE NEGATIVE mg/dL   Hgb urine dipstick NEGATIVE NEGATIVE   Bilirubin Urine NEGATIVE NEGATIVE   Ketones, ur NEGATIVE NEGATIVE mg/dL   Protein, ur NEGATIVE NEGATIVE mg/dL   Nitrite NEGATIVE NEGATIVE   Leukocytes,Ua MODERATE (A) NEGATIVE   RBC / HPF 0-5 0 - 5 RBC/hpf   WBC, UA 0-5 0 - 5 WBC/hpf   Bacteria, UA RARE (A) NONE SEEN   Squamous Epithelial / LPF 0-5 0 - 5    Comment: Performed at [facility], [place] [address] [place], [place] [address]  Resp Panel by RT-PCR (Flu A&B, Covid) Anterior Nasal Swab     Status: None   Collection Time: 05/31/22 12:02 PM   Specimen: Anterior Nasal Swab  Result Value Ref Range   SARS Coronavirus 2 by RT PCR NEGATIVE NEGATIVE    Comment: (NOTE) SARS-CoV-2 target nucleic acids are NOT DETECTED.  The SARS-CoV-2 RNA is generally detectable in upper respiratory specimens during the acute phase of infection. The lowest concentration of SARS-CoV-2 viral copies this assay can detect is 138 copies/mL. A negative result does not preclude SARS-Cov-2 infection and should not be used as the sole basis for treatment or other patient management decisions. A negative result may occur with  improper specimen collection/handling, submission of specimen other than nasopharyngeal swab, presence of viral mutation(s) within the areas targeted by this assay, and  inadequate number of viral copies(<138 copies/mL). A negative result must be combined with clinical observations, patient history, and epidemiological information. The expected result is Negative.  Fact Sheet for Patients:  EntrepreneurPulse.com.au  Fact Sheet for Healthcare Providers:  IncredibleEmployment.be  This test is no t yet approved or cleared by the [place] and  has been authorized  for detection and/or diagnosis of SARS-CoV-2 by FDA under an Emergency Use Authorization (EUA). This EUA will remain  in effect (meaning this test can be used) for the duration of the COVID-19 declaration under Section 564(b)(1) of the Act, 21 U.S.C.section 360bbb-3(b)(1), unless the authorization is terminated  or revoked sooner.       Influenza A by PCR NEGATIVE NEGATIVE   Influenza B by PCR NEGATIVE NEGATIVE    Comment: (NOTE) The Xpert Xpress SARS-CoV-2/FLU/RSV plus assay is intended as an aid in the diagnosis of influenza from Nasopharyngeal swab specimens and should not be used as a sole basis for treatment. Nasal washings and aspirates are unacceptable for Xpert Xpress SARS-CoV-2/FLU/RSV testing.  Fact Sheet for Patients: EntrepreneurPulse.com.au  Fact Sheet for Healthcare Providers: IncredibleEmployment.be  This test is not yet approved or cleared by the [place] FDA and has been authorized for detection and/or diagnosis of SARS-CoV-2 by FDA under an Emergency Use Authorization (EUA). This EUA will remain in effect (meaning this test can be used) for the duration of the COVID-19 declaration under Section 564(b)(1) of the Act, 21 U.S.C. section 360bbb-3(b)(1), unless the authorization is terminated or revoked.  Performed at [facility], [place] [address], [place], [place] [address]   Urinalysis, Routine w reflex microscopic Urine, Clean Catch     Status: Abnormal   Collection Time:  06/14/22  5:31 PM  Result Value Ref Range   Color, Urine YELLOW YELLOW   APPearance HAZY (A) CLEAR   Specific Gravity, Urine 1.012 1.005 - 1.030   pH 6.0 5.0 - 8.0   Glucose, UA NEGATIVE NEGATIVE mg/dL   Hgb urine dipstick NEGATIVE NEGATIVE   Bilirubin Urine NEGATIVE NEGATIVE   Ketones, ur 20 (A) NEGATIVE mg/dL   Protein, ur NEGATIVE NEGATIVE mg/dL   Nitrite NEGATIVE NEGATIVE   Leukocytes,Ua MODERATE (A) NEGATIVE   RBC / HPF 0-5 0 - 5 RBC/hpf   WBC, UA 0-5 0 - 5 WBC/hpf   Bacteria, UA RARE (A) NONE SEEN   Squamous Epithelial / LPF 0-5 0 - 5   Mucus PRESENT     Comment: Performed at [facility], [address] [place], [place] [address]  Protein / creatinine ratio, urine     Status: None   Collection Time: 06/14/22  5:31 PM  Result Value Ref Range   Creatinine, Urine 82.00 mg/dL   Total Protein, Urine 11 mg/dL    Comment: NO NORMAL RANGE ESTABLISHED FOR THIS TEST   Protein Creatinine Ratio 0.13 0.00 - 0.15 mg/mg[Cre]    Comment: Performed at [facility] [address], [place], [place] [address]  CBC     Status: Abnormal   Collection Time: 06/14/22  5:48 PM  Result Value Ref Range   WBC 10.2 4.0 - 10.5 K/uL   RBC 4.71 3.87 - 5.11 MIL/uL   Hemoglobin 10.9 (L) 12.0 - 15.0 g/dL   HCT 33.7 (L) 36.0 - 46.0 %   MCV 71.5 (L) 80.0 - 100.0 fL   MCH 23.1 (L) 26.0 - 34.0 pg   MCHC 32.3 30.0 - 36.0 g/dL   RDW 13.6 11.5 - 15.5 %   Platelets 161 150 - 400 K/uL   nRBC 0.0 0.0 - 0.2 %    Comment: Performed at [facility], [place] [address] [place], [place] [address]  Comprehensive metabolic panel     Status: Abnormal   Collection Time: 06/14/22  5:48 PM  Result Value Ref Range  Sodium 134 (L) 135 - 145 mmol/L   Potassium 4.0 3.5 - 5.1 mmol/L   Chloride 105 98 - 111 mmol/L   CO2 20 (L) 22 - 32 mmol/L   Glucose, Bld 82 70 - 99 mg/dL    Comment: Glucose reference range applies only to samples taken after fasting for at least 8 hours.   BUN 6 6 - 20 mg/dL    Creatinine, Ser 0.90 0.44 - 1.00 mg/dL   Calcium 9.2 8.9 - 10.3 mg/dL   Total Protein 6.7 6.5 - 8.1 g/dL   Albumin 3.0 (L) 3.5 - 5.0 g/dL   AST 16 15 - 41 U/L   ALT 11 0 - 44 U/L   Alkaline Phosphatase 89 38 - 126 U/L   Total Bilirubin 0.4 0.3 - 1.2 mg/dL   GFR, Estimated >60 >60 mL/min    Comment: (NOTE) Calculated using the CKD-EPI Creatinine Equation (2021)    Anion gap 9 5 - 15    Comment: Performed at [facility] [address], [place], [place] [address]  POC Urinalysis Dipstick OB     Status: None   Collection Time: 06/21/22  3:56 PM  Result Value Ref Range   Color, UA     Clarity, UA     Glucose, UA Negative Negative   Bilirubin, UA Negative    Ketones, UA Negative    Spec Grav, UA 1.015 1.010 - 1.025   Blood, UA Negative    pH, UA 6.5 5.0 - 8.0   POC,PROTEIN,UA Negative Negative, Trace, Small (1+), Moderate (2+), Large (3+), 4+   Urobilinogen, UA 0.2 0.2 or 1.0 E.U./dL   Nitrite, UA Negative    Leukocytes, UA Negative Negative   Appearance     Odor    CBC     Status: Abnormal   Collection Time: 06/25/22 11:46 AM  Result Value Ref Range   WBC 9.0 4.0 - 10.5 K/uL   RBC 4.78 3.87 - 5.11 MIL/uL   Hemoglobin 10.6 (L) 12.0 - 15.0 g/dL   HCT 33.8 (L) 36.0 - 46.0 %   MCV 70.7 (L) 80.0 - 100.0 fL   MCH 22.2 (L) 26.0 - 34.0 pg   MCHC 31.4 30.0 - 36.0 g/dL   RDW 13.3 11.5 - 15.5 %   Platelets 178 150 - 400 K/uL   nRBC 0.0 0.0 - 0.2 %    Comment: Performed at [facility], [place] [place], [place] [address]  Comprehensive metabolic panel     Status: Abnormal   Collection Time: 06/25/22 11:46 AM  Result Value Ref Range   Sodium 134 (L) 135 - 145 mmol/L   Potassium 3.8 3.5 - 5.1 mmol/L   Chloride 107 98 - 111 mmol/L   CO2 20 (L) 22 - 32 mmol/L   Glucose, Bld 87 70 - 99 mg/dL    Comment: Glucose reference range applies only to samples taken after fasting for at least 8 hours.   BUN 8 6 - 20 mg/dL   Creatinine, Ser 0.40 (L) 0.44 - 1.00 mg/dL    Calcium 9.1 8.9 - 10.3 mg/dL   Total Protein 7.1 6.5 - 8.1 g/dL   Albumin 3.2 (L) 3.5 - 5.0 g/dL   AST 17 15 - 41 U/L   ALT 9 0 - 44 U/L   Alkaline Phosphatase 94 38 - 126 U/L   Total Bilirubin 0.3 0.3 - 1.2 mg/dL   GFR, Estimated >60 >60 mL/min    Comment: (NOTE) Calculated  using the CKD-EPI Creatinine Equation (2021)    Anion gap 7 5 - 15    Comment: Performed at [facility], [place] [place], [place] [address]  Protein / creatinine ratio, urine     Status: Abnormal   Collection Time: 06/25/22 11:51 AM  Result Value Ref Range   Creatinine, Urine 83 mg/dL   Total Protein, Urine 17 mg/dL    Comment: NO NORMAL RANGE ESTABLISHED FOR THIS TEST   Protein Creatinine Ratio 0.20 (H) 0.00 - 0.15 mg/mg[Cre]    Comment: Performed at [facility], [address] [address] [place], [place] [address]  Glucose tolerance, 1 hour     Status: None   Collection Time: 07/12/22  8:16 AM  Result Value Ref Range   Glucose, 1Hr PP 111 70 - 199 mg/dL  CBC     Status: Abnormal   Collection Time: 07/12/22  8:16 AM  Result Value Ref Range   WBC 7.8 3.4 - 10.8 x10E3/uL   RBC 4.47 3.77 - 5.28 x10E6/uL   Hemoglobin 10.0 (L) 11.1 - 15.9 g/dL   Hematocrit 30.5 (L) 34.0 - 46.6 %   MCV 68 (L) 79 - 97 fL   MCH 22.4 (L) 26.6 - 33.0 pg   MCHC 32.8 31.5 - 35.7 g/dL   RDW 14.2 11.7 - 15.4 %   Platelets 213 150 - 450 x10E3/uL  RPR     Status: None   Collection Time: 07/12/22  8:16 AM  Result Value Ref Range   RPR Ser Ql Non Reactive Non Reactive  Comprehensive metabolic panel     Status: Abnormal   Collection Time: 07/12/22  8:16 AM  Result Value Ref Range   Glucose 111 (H) 70 - 99 mg/dL   BUN 8 6 - 20 mg/dL   Creatinine, Ser 0.64 0.57 - 1.00 mg/dL   eGFR 126 >59 mL/min/1.73   BUN/Creatinine Ratio 13 9 - 23   Sodium 135 134 - 144 mmol/L   Potassium 3.8 3.5 - 5.2 mmol/L   Chloride 102 96 - 106 mmol/L   CO2 16 (L) 20 - 29 mmol/L   Calcium 8.7 8.7 - 10.2 mg/dL   Total Protein 6.2 6.0  - 8.5 g/dL   Albumin 3.3 (L) 4.0 - 5.0 g/dL    Comment:               **Please note reference interval change**   Globulin, Total 2.9 1.5 - 4.5 g/dL   Albumin/Globulin Ratio 1.1 (L) 1.2 - 2.2   Bilirubin Total <0.2 0.0 - 1.2 mg/dL   Alkaline Phosphatase 132 (H) 44 - 121 IU/L   AST 14 0 - 40 IU/L   ALT 6 0 - 32 IU/L  Specimen status report     Status: None   Collection Time: 07/12/22  8:16 AM  Result Value Ref Range   specimen status report Comment     Comment: Written Authorization Written Authorization Written Authorization Received. Authorization received from Lloyd Huger 07-13-2022 Logged by Leona Carry   Fetal fibronectin     Status: None   Collection Time: 07/27/22  1:55 AM  Result Value Ref Range   Fetal Fibronectin NEGATIVE NEGATIVE    Comment: Performed at [facility], [address] [place], [place] [address]  Amnisure rupture of membrane (rom)not at [facility]     Status: None   Collection Time: 07/27/22  2:10 AM  Result Value Ref Range   Amnisure ROM NEGATIVE     Comment:  Performed at [facility], [place] [address] [place], [place] [address]  Wet prep, genital     Status: Abnormal   Collection Time: 07/27/22  2:10 AM  Result Value Ref Range   Yeast Wet Prep HPF POC NONE SEEN NONE SEEN   Trich, Wet Prep NONE SEEN NONE SEEN   Clue Cells Wet Prep HPF POC PRESENT (A) NONE SEEN   WBC, Wet Prep HPF POC <10 <10   Sperm NONE SEEN     Comment: Performed at [facility], [place] [address] [place], [place] [address]  Urinalysis, Routine w reflex microscopic Urine, Clean Catch     Status: Abnormal   Collection Time: 07/27/22  2:27 AM  Result Value Ref Range   Color, Urine YELLOW YELLOW   APPearance HAZY (A) CLEAR   Specific Gravity, Urine 1.016 1.005 - 1.030   pH 6.0 5.0 - 8.0   Glucose, UA NEGATIVE NEGATIVE mg/dL   Hgb urine dipstick NEGATIVE NEGATIVE   Bilirubin Urine NEGATIVE NEGATIVE   Ketones, ur NEGATIVE NEGATIVE mg/dL   Protein, ur 30 (A)  NEGATIVE mg/dL   Nitrite NEGATIVE NEGATIVE   Leukocytes,Ua MODERATE (A) NEGATIVE   RBC / HPF 0-5 0 - 5 RBC/hpf   WBC, UA 0-5 0 - 5 WBC/hpf   Bacteria, UA RARE (A) NONE SEEN   Squamous Epithelial / LPF 6-10 0 - 5   Mucus PRESENT     Comment: Performed at [facility], [place] [address], [place], [place] [address]     Review of Systems  Constitutional:  Negative for fever.  Gastrointestinal:  Positive for abdominal pain. Negative for diarrhea.  Genitourinary:  Positive for vaginal bleeding. Negative for vaginal discharge.   Physical Exam   Blood pressure (!) 105/49, pulse (!) 118, temperature 98.1 F (36.7 C), temperature source Oral, resp. rate 20, height '5\' 2"'  (1.575 m), weight 74.8 kg, last menstrual period 12/09/2021, SpO2 97 %, not currently breastfeeding.  Physical Exam Constitutional:      Appearance: Normal appearance. She is normal weight.  HENT:     Head: Normocephalic.  Pulmonary:     Effort: Pulmonary effort is normal.  Abdominal:     Palpations: Abdomen is soft.  Genitourinary:    Comments: Dilation: Closed Exam by:: Altamease Oiler, NP  Neurological:     Mental Status: She is alert and oriented to person, place, and time.  Psychiatric:        Mood and Affect: Mood normal.     MAU Course  Procedures  MDM  Due to current HSV outbreak, speculum not preformed d/t pain. Lidocaine jelly applied.   Wet prep, Amnisure, FFN UA Procardia and oral hydration done.  Labile BP's in MAU, no severe range BP's.   Assessment and Plan   A:  1. Braxton Hicks contractions   2. [redacted] weeks gestation of pregnancy   3. Bacterial vaginosis      P:  Dc home  Return to MAU if symptoms worsen RX: Flagyl Keep OB appointments Rest, increase oral fluid intake.  Pre E precautions.    Noni Saupe I, NP. 07/30/2022 9:49 AM

## 2022-07-27 NOTE — MAU Note (Signed)
Stanton Kidney, RN entered pt room to adjust EFM. RN asked pt if she was feeling any more contractions.. pt reported that she was not feeling contractions at this time.   Devina Bezold, RN notified Blanche East, NP of pt's lab results, along with pt report of no contractions with note of 0 contractions reported on toco monitor. NP stated she will put in d/c order and come talk to the patient.   0330 J. Rasch, NP at bedside.

## 2022-07-30 ENCOUNTER — Other Ambulatory Visit: Payer: Self-pay

## 2022-07-30 MED ORDER — ACYCLOVIR 400 MG PO TABS: 400.0000 mg | ORAL_TABLET | Freq: Three times a day (TID) | ORAL | 3 refills | Status: DC

## 2022-08-15 ENCOUNTER — Ambulatory Visit: Payer: Managed Care, Other (non HMO) | Admitting: Psychiatry

## 2022-08-15 ENCOUNTER — Ambulatory Visit (INDEPENDENT_AMBULATORY_CARE_PROVIDER_SITE_OTHER): Payer: Managed Care, Other (non HMO) | Admitting: Certified Nurse Midwife

## 2022-08-15 ENCOUNTER — Encounter: Payer: Self-pay | Admitting: Certified Nurse Midwife

## 2022-08-15 VITALS — BP 136/82 | HR 88 | Wt 162.3 lb

## 2022-08-15 DIAGNOSIS — Z3483 Encounter for supervision of other normal pregnancy, third trimester: Secondary | ICD-10-CM | POA: Diagnosis not present

## 2022-08-15 DIAGNOSIS — Z3A35 35 weeks gestation of pregnancy: Secondary | ICD-10-CM | POA: Diagnosis not present

## 2022-08-15 NOTE — Patient Instructions (Signed)

## 2022-08-15 NOTE — Progress Notes (Signed)
Rob doing well, feeling good movement. She has been having contractions and asks about vaginal exam. Explained that vaginal exams can cause contractions and recommend waiting until 37 wks or if concern for labor. She agrees to plan. Discussed GBS testing next week. BP today normal. Reviewed Pre E signs and symptoms , she denies any symptoms at this time. States her headaches have been better recently. Follow up 1 wk for ROB with Missy.   Doreene Burke, CNM

## 2022-08-22 ENCOUNTER — Encounter: Payer: Managed Care, Other (non HMO) | Admitting: Obstetrics and Gynecology

## 2022-08-22 DIAGNOSIS — Z3A36 36 weeks gestation of pregnancy: Secondary | ICD-10-CM

## 2022-08-22 DIAGNOSIS — Z113 Encounter for screening for infections with a predominantly sexual mode of transmission: Secondary | ICD-10-CM

## 2022-08-22 DIAGNOSIS — O0993 Supervision of high risk pregnancy, unspecified, third trimester: Secondary | ICD-10-CM

## 2022-08-22 DIAGNOSIS — Z3685 Encounter for antenatal screening for Streptococcus B: Secondary | ICD-10-CM

## 2022-08-25 ENCOUNTER — Encounter: Payer: Self-pay | Admitting: Obstetrics and Gynecology

## 2022-08-25 ENCOUNTER — Other Ambulatory Visit: Payer: Self-pay

## 2022-08-25 ENCOUNTER — Observation Stay
Admission: EM | Admit: 2022-08-25 | Discharge: 2022-08-25 | Disposition: A | Discharge status: Home / Self Care | Payer: Managed Care, Other (non HMO) | Attending: Obstetrics and Gynecology | Admitting: Obstetrics and Gynecology

## 2022-08-25 DIAGNOSIS — O479 False labor, unspecified: Secondary | ICD-10-CM | POA: Diagnosis present

## 2022-08-25 DIAGNOSIS — O26893 Other specified pregnancy related conditions, third trimester: Secondary | ICD-10-CM

## 2022-08-25 DIAGNOSIS — R109 Unspecified abdominal pain: Secondary | ICD-10-CM

## 2022-08-25 DIAGNOSIS — Z3A37 37 weeks gestation of pregnancy: Secondary | ICD-10-CM

## 2022-08-25 DIAGNOSIS — O471 False labor at or after 37 completed weeks of gestation: Secondary | ICD-10-CM | POA: Diagnosis not present

## 2022-08-25 DIAGNOSIS — Z79899 Other long term (current) drug therapy: Secondary | ICD-10-CM | POA: Diagnosis not present

## 2022-08-25 DIAGNOSIS — Z7982 Long term (current) use of aspirin: Secondary | ICD-10-CM | POA: Diagnosis not present

## 2022-08-25 NOTE — OB Triage Note (Signed)
Patient discharged home per order.  She is stable and ambulatory. An After Visit Summary was printed and given to the patient. Discharge education completed with patient and support person including follow up instructions, appointments, and medication list. She received labor and bleeding precautions. Patient able to verbalize understanding. All questions fully answered upon discharge. Patient instructed to return to ED, call 911, or call provider for any changes in condition. Patient discharged home via personal vehicle with support person and all belongings.    

## 2022-08-25 NOTE — Discharge Summary (Signed)
Physician Final Progress Note  Patient ID: Michelle Thompson MRN: [id number] DOB/AGE: [birth year] 26 y.o.  Admit date: 08/25/2022 Admitting provider: Tresea Mall, CNM Discharge date: 08/25/2022   Admission Diagnoses:  1) intrauterine pregnancy at [redacted]w[redacted]d  2) uterine contractions  Discharge Diagnoses:  Principal Problem:   Uterine contractions Active Problems:   [redacted] weeks gestation of pregnancy    History of Present Illness: The patient is a 26 y.o. female [phone] at [redacted]w[redacted]d who presents for contractions since earlier in the evening she states every 2-3 minutes. She is admitted for observation, placed on monitors, cervical exam. No cervical change during stay in triage. She is discharged to home with instructions and precautions.    Past Medical History:  Diagnosis Date   Complication of anesthesia    fentanyl itching   Hypertension    Gestational Hypertension    Past Surgical History:  Procedure Laterality Date   NO PAST SURGERIES      No current facility-administered medications on file prior to encounter.   Current Outpatient Medications on File Prior to Encounter  Medication Sig Dispense Refill   acetaminophen (TYLENOL) 500 MG tablet Take 1,000 mg by mouth every 6 (six) hours as needed.     acyclovir (ZOVIRAX) 400 MG tablet Take 1 tablet (400 mg total) by mouth 3 (three) times daily. 90 tablet 3   aspirin EC 81 MG tablet Take 1 tablet (81 mg total) by mouth daily. Swallow whole. 30 tablet 5   Iron-FA-B Cmp-C-Biot-Probiotic (FUSION PLUS) CAPS Take 30 tablets by mouth daily. 30 capsule 3   prenatal vitamin w/FE, FA (PRENATAL 1 + 1) 27-1 MG TABS tablet Take 1 tablet by mouth daily at 12 noon.     guaiFENesin (MUCINEX) 600 MG 12 hr tablet Take 2 tablets (1,200 mg total) by mouth 2 (two) times daily. (Patient not taking: Reported on 06/25/2022) 30 tablet 0   promethazine (PHENERGAN) 25 MG tablet Take 1 tablet (25 mg total) by mouth every 6 (six) hours as needed for nausea or  vomiting. (Patient not taking: Reported on 06/25/2022) 30 tablet 5   [DISCONTINUED] Hyoscyamine Sulfate SL (LEVSIN/SL) 0.125 MG SUBL Place 1 tablet under the tongue every 12 (twelve) hours as needed (abdominal cramping). 10 tablet 0    Allergies  Allergen Reactions   Fentanyl Shortness Of Breath and Itching    7.11.2022 Pt reports that she was having anxiety and this was the reason for shortness of breath.    Social History   Socioeconomic History   Marital status: Married    Spouse name: Not on file   Number of children: Not on file   Years of education: Not on file   Highest education level: Not on file  Occupational History   Not on file  Tobacco Use   Smoking status: Never   Smokeless tobacco: Never  Vaping Use   Vaping Use: Never used  Substance and Sexual Activity   Alcohol use: No   Drug use: No   Sexual activity: Yes    Partners: Male    Birth control/protection: Surgical    Comment: BTL  Other Topics Concern   Not on file  Social History Narrative   Not on file   Social Determinants of Health   Financial Resource Strain: Not on file  Food Insecurity: No Food Insecurity (07/04/2021)   Hunger Vital Sign    Worried About Running Out of Food in the Last Year: Never true    Ran Out of Food in the Last  Year: Never true  Transportation Needs: No Transportation Needs (07/04/2021)   PRAPARE - Administrator, Civil Service (Medical): No    Lack of Transportation (Non-Medical): No  Physical Activity: Not on file  Stress: Not on file  Social Connections: Not on file  Intimate Partner Violence: Not on file    Family History  Problem Relation Age of Onset   Lupus Mother    Healthy Father    Asthma Brother    Diabetes Maternal Grandmother    COPD Maternal Grandmother    Cancer Maternal Grandmother    Diabetes Maternal Grandfather    Stroke Maternal Grandfather      Review of Systems  Constitutional:  Negative for chills and fever.  HENT:  Negative  for congestion, ear discharge, ear pain, hearing loss, sinus pain and sore throat.   Eyes:  Negative for blurred vision and double vision.  Respiratory:  Negative for cough, shortness of breath and wheezing.   Cardiovascular:  Negative for chest pain, palpitations and leg swelling.  Gastrointestinal:  Positive for abdominal pain. Negative for blood in stool, constipation, diarrhea, heartburn, melena, nausea and vomiting.  Genitourinary:  Negative for dysuria, flank pain, frequency, hematuria and urgency.  Musculoskeletal:  Negative for back pain, joint pain and myalgias.  Skin:  Negative for itching and rash.  Neurological:  Negative for dizziness, tingling, tremors, sensory change, speech change, focal weakness, seizures, loss of consciousness, weakness and headaches.  Endo/Heme/Allergies:  Negative for environmental allergies. Does not bruise/bleed easily.  Psychiatric/Behavioral:  Negative for depression, hallucinations, memory loss, substance abuse and suicidal ideas. The patient is not nervous/anxious and does not have insomnia.      Physical Exam: BP (!) 143/93   Pulse 98   Temp 98.2 F (36.8 C) (Oral)   LMP 12/09/2021 (Approximate)   Constitutional: Well nourished, well developed female in no acute distress.  HEENT: normal Skin: Warm and dry.  Cardiovascular: Regular rate and rhythm.   Respiratory: Clear to auscultation bilateral. Normal respiratory effort Abdomen: FHT present Psych: Alert and Oriented x3. No memory deficits. Normal mood and affect.    Pelvic exam: per RN A. Chalmers Guest 1.5/20/-3/posterior  Toco: every 3-10 minutes Fetal well being: reactive NST; 135 bpm, moderate variability, +accelerations, -decelerations  Consults: None  Significant Findings/ Diagnostic Studies: none  Procedures: NST  Hospital Course: The patient was admitted to Labor and Delivery Triage for observation.   Discharge Condition: good  Disposition: Discharge disposition: 01-Home or Self  Care  Diet: Regular diet  Discharge Activity: Activity as tolerated  Discharge Instructions     Discharge activity:  No Restrictions   Complete by: As directed    Discharge diet:  No restrictions   Complete by: As directed       Allergies as of 08/25/2022       Reactions   Fentanyl Shortness Of Breath, Itching   7.11.2022 Pt reports that she was having anxiety and this was the reason for shortness of breath.        Medication List     STOP taking these medications    guaiFENesin 600 MG 12 hr tablet Commonly known as: MUCINEX   promethazine 25 MG tablet Commonly known as: PHENERGAN       TAKE these medications    acetaminophen 500 MG tablet Commonly known as: TYLENOL Take 1,000 mg by mouth every 6 (six) hours as needed.   acyclovir 400 MG tablet Commonly known as: Zovirax Take 1 tablet (400 mg total) by  mouth 3 (three) times daily.   aspirin EC 81 MG tablet Take 1 tablet (81 mg total) by mouth daily. Swallow whole.   Fusion Plus Caps Take 30 tablets by mouth daily.   prenatal vitamin w/FE, FA 27-1 MG Tabs tablet Take 1 tablet by mouth daily at 12 noon.         Total time spent taking care of this patient: 22 minutes  Signed: Rod Can, CNM  08/25/2022, 10:52 PM

## 2022-08-25 NOTE — OB Triage Note (Signed)
Pt is a 26yo [id number], 37w 3d. She arrived to the unit with complaints of ctx and lof. She denies vaginal bleeding, reports positive fetal movement, and reports contractions every 2-37mins. VS stable, monitors applied and assessing.   Initial FHT 145 at 2023.

## 2022-08-28 ENCOUNTER — Encounter: Payer: Managed Care, Other (non HMO) | Admitting: Obstetrics and Gynecology

## 2022-08-28 DIAGNOSIS — Z3A37 37 weeks gestation of pregnancy: Secondary | ICD-10-CM

## 2022-08-28 DIAGNOSIS — Z113 Encounter for screening for infections with a predominantly sexual mode of transmission: Secondary | ICD-10-CM

## 2022-08-28 DIAGNOSIS — Z3685 Encounter for antenatal screening for Streptococcus B: Secondary | ICD-10-CM

## 2022-08-28 DIAGNOSIS — O0993 Supervision of high risk pregnancy, unspecified, third trimester: Secondary | ICD-10-CM

## 2022-09-13 ENCOUNTER — Ambulatory Visit (INDEPENDENT_AMBULATORY_CARE_PROVIDER_SITE_OTHER): Payer: Managed Care, Other (non HMO) | Admitting: Obstetrics

## 2022-09-13 ENCOUNTER — Encounter: Payer: Self-pay | Admitting: Obstetrics

## 2022-09-13 VITALS — BP 162/93 | HR 68 | Ht 62.0 in | Wt 150.2 lb

## 2022-09-13 DIAGNOSIS — Z3A40 40 weeks gestation of pregnancy: Secondary | ICD-10-CM

## 2022-09-13 DIAGNOSIS — I82890 Acute embolism and thrombosis of other specified veins: Secondary | ICD-10-CM

## 2022-09-13 MED ORDER — NIFEDIPINE ER OSMOTIC RELEASE 30 MG PO TB24: 30.0000 mg | ORAL_TABLET | Freq: Every day | ORAL | 3 refills | Status: DC

## 2022-09-13 MED ORDER — ENOXAPARIN SODIUM 30 MG/0.3ML IJ SOSY: 30.0000 mg | PREFILLED_SYRINGE | Freq: Two times a day (BID) | INTRAMUSCULAR | 0 refills | Status: DC

## 2022-09-13 MED ORDER — ENOXAPARIN SODIUM 30 MG/0.3ML IJ SOSY: 30.0000 mg | PREFILLED_SYRINGE | Freq: Two times a day (BID) | INTRAMUSCULAR | 3 refills | Status: DC

## 2022-09-13 MED ORDER — NIFEDIPINE ER OSMOTIC RELEASE 30 MG PO TB24: 30.0000 mg | ORAL_TABLET | Freq: Every day | ORAL | 2 refills | Status: DC

## 2022-09-13 NOTE — Progress Notes (Signed)
POSTPARTUM ENCOUNTER  Subjective  HPI: Michelle Thompson is a 26 y.o. [phone] who is s/p NSVD on 08/31/22. She birthed at [facility]. She was diagnosed with preeclampsia with severe features and received magnesium during her labor. During her postpartum hospital stay, she began experiencing stabbing pain in her right side. After discharge home, this began to worsen. It woke her from her sleep on 09/09/22 and she was evaluated in Urgent Care. She then went to the ED, where a CT scan showed non-occlusive thrombus in her left ovarian vein. Her blood pressure remains elevated, but she denies HA, visual change, and epigastric pain. Her postpartum healing is otherwise normal.   Past Medical History:  Diagnosis Date   Complication of anesthesia    fentanyl itching   Hypertension    Gestational Hypertension   Past Surgical History:  Procedure Laterality Date   NO PAST SURGERIES     OB History     Gravida  4   Para  3   Term  2   Preterm  1   AB      Living  3      SAB      IAB      Ectopic      Multiple  0   Live Births  3          Allergies  Allergen Reactions   Fentanyl Shortness Of Breath and Itching    7.11.2022 Pt reports that she was having anxiety and this was the reason for shortness of breath.   ROS Mood: feels stable, no concerns for PPD/PPA at this time Breasts: bottle feeding. No pain or tenderness. Bowel: Had one episode of bloody stool; BM normal now Bladder: normal bladder function Perineum: Intact. No pain. Bleeding: Minimal Pain: Lower abdominal pain - reports left side had been hurting more and now right side is painful, too   Objective  BP (!) 162/93   Pulse 68   Ht 5\' 2"  (1.575 m)   Wt 150 lb 3.2 oz (68.1 kg)   LMP 12/09/2021 (Approximate)   Breastfeeding Unknown   BMI 27.47 kg/m   General appearance: alert, cooperative Head: Normocephalic, without obvious abnormality, atraumatic Eyes: negative findings: lids and lashes normal and  conjunctivae and sclerae normal Lungs: clear to auscultation bilaterally Heart: regular rate and rhythm, S1, S2 normal, no murmur, click, rub or gallop Abdomen: Tender to palpation in bilateral lower quadrants  Assessment 1) 13 days PP 2) Ovarian thombophlebitis 3) Elevated BP  Plan 1) Reviewed PP activity restrictions and expectations for normal healing 2) Discussed plan of care with Dr. 14/09/2021. The recommended treatment is enoxaparin injections BID at 1 mg/kg x 4 weeks. Michelle Thompson is familiar with how to administer the injections (has given it as nurse). Reviewed common side effects and proper injection sites. Return in 4 weeks for evaluation and pelvic Michelle Thompson. Reviewed danger signs and when to seek emergency care. 3) Start nifedipine 30 mg daily. Michelle Thompson will monitor her BP daily and increase dose to 60 mg if BPs do not improve.  Michelle Thompson, CNM

## 2022-09-15 ENCOUNTER — Other Ambulatory Visit: Payer: Self-pay | Admitting: Obstetrics

## 2022-09-15 MED ORDER — ENOXAPARIN SODIUM 40 MG/0.4ML IJ SOSY: 40.0000 mg | PREFILLED_SYRINGE | INTRAMUSCULAR | 0 refills | Status: DC

## 2022-09-15 NOTE — Progress Notes (Signed)
Michelle Thompson called to discuss other potential treatment options because giving herself lovenox shots has been very stressful (the thought of the shot is distressing to her). Discussed changing dosing to 1.5 mg/kg daily instead of 1 mg/kg BID. Michelle Thompson is in agreement with this plan and will call to schedule an appointment to discuss further with Dr. Marcelline Mates. Rx sent to pharmacy.  M. Norberto Sorenson, CNM

## 2022-10-01 ENCOUNTER — Telehealth: Payer: Self-pay

## 2022-10-01 NOTE — Telephone Encounter (Signed)
Patient is calling to to clarify if she is needing a follow-up CT or ultrasound. She believes it should be a CT, per the last note it says ultrasound. Please advise and place order.

## 2022-10-01 NOTE — Telephone Encounter (Signed)
Forwarded to patient

## 2022-10-01 NOTE — Progress Notes (Deleted)
   OBSTETRICS POSTPARTUM CLINIC PROGRESS NOTE  Subjective:     Michelle Thompson is a 26 y.o. [phone] female who presents for a postpartum visit. She is 6 weeks postpartum following a spontaneous vaginal delivery. I have fully reviewed the prenatal and intrapartum course. The delivery was at 37.6 gestational weeks.  Anesthesia: epidural. Postpartum course has been ***. Baby's course has been ***. Baby is feeding by {breast/bottle:69}. Bleeding: patient {HAS HAS LTR:32023} not resumed menses, with No LMP recorded.. Bowel function is {normal:32111}. Bladder function is {normal:32111}. Patient {is/is not:9024} sexually active. Contraception method desired is {contraceptive method:5051}. Postpartum depression screening: {neg default:13464::"negative"}.  EDPS score is ***.    The following portions of the patient's history were reviewed and updated as appropriate: allergies, current medications, past family history, past medical history, past social history, past surgical history, and problem list.  Review of Systems {ros; complete:30496}   Objective:    There were no vitals taken for this visit.  General:  alert and no distress   Breasts:  inspection negative, no nipple discharge or bleeding, no masses or nodularity palpable  Lungs: clear to auscultation bilaterally  Heart:  regular rate and rhythm, S1, S2 normal, no murmur, click, rub or gallop  Abdomen: soft, non-tender; bowel sounds normal; no masses,  no organomegaly.  ***Well healed Pfannenstiel incision   Vulva:  normal  Vagina: normal vagina, no discharge, exudate, lesion, or erythema  Cervix:  no cervical motion tenderness and no lesions  Corpus: normal size, contour, position, consistency, mobility, non-tender  Adnexa:  normal adnexa and no mass, fullness, tenderness  Rectal Exam: Not performed.         Labs:  Lab Results  Component Value Date   HGB 10.0 (L) 07/12/2022     Assessment:   No diagnosis found.   Plan:    1.  Contraception: {method:5051} 2. Will check Hgb for h/o postpartum anemia of less than 10.  3. Follow up in: {1-10:13787} {time; units:19136} or as needed.    Rubie Maid, MD [facility]

## 2022-10-01 NOTE — Telephone Encounter (Signed)
Please inform that yes a follow-up CT would be warranted after completion of her medication, but if she has not been able to take the medication to get rid of the clot, then it may likely still be there. Unfortunately there is not a tablet form of the anti-clotting medication that she can take postpartum (especially if she is breastfeeding).  If she can not take use the injections, she can take 2 tablets of an 81-mg baby aspirin for the next 4 weeks, and then we can get her scheduled for another CT scan after that.

## 2022-10-02 ENCOUNTER — Ambulatory Visit: Payer: Managed Care, Other (non HMO) | Admitting: Obstetrics and Gynecology

## 2022-10-02 ENCOUNTER — Other Ambulatory Visit: Payer: Self-pay | Admitting: Obstetrics and Gynecology

## 2022-10-02 DIAGNOSIS — R102 Pelvic and perineal pain: Secondary | ICD-10-CM

## 2022-10-04 ENCOUNTER — Telehealth: Payer: Self-pay

## 2022-10-04 NOTE — Telephone Encounter (Signed)
Pt.notified

## 2022-10-04 NOTE — Telephone Encounter (Signed)
Pt states she has questions regarding Nifedipine medication; states she was interested in starting taking supplement ashwaganda and wanted to know if there were any contraindications between the two. Pt states she is okay receiving a mychart message to get back in touch if supplement is okay to take or not. Please advise.

## 2022-10-08 NOTE — Progress Notes (Deleted)
   OBSTETRICS POSTPARTUM CLINIC PROGRESS NOTE  Subjective:     Michelle Thompson is a 26 y.o. [phone] female who presents for a postpartum visit. She is 6 weeks postpartum following a spontaneous vaginal delivery. I have fully reviewed the prenatal and intrapartum course. The delivery was at 37.6 gestational weeks.  Anesthesia: {anesthesia types:812}. Postpartum course has been ***. Baby's course has been ***. Baby is feeding by {breast/bottle:69}. Bleeding: patient {HAS HAS QBH:41937} not resumed menses, with No LMP recorded.. Bowel function is {normal:32111}. Bladder function is {normal:32111}. Patient {is/is not:9024} sexually active. Contraception method desired is {contraceptive method:5051}. Postpartum depression screening: {neg default:13464::"negative"}.  EDPS score is ***.    The following portions of the patient's history were reviewed and updated as appropriate: allergies, current medications, past family history, past medical history, past social history, past surgical history, and problem list.  Review of Systems {ros; complete:30496}   Objective:    There were no vitals taken for this visit.  General:  alert and no distress   Breasts:  inspection negative, no nipple discharge or bleeding, no masses or nodularity palpable  Lungs: clear to auscultation bilaterally  Heart:  regular rate and rhythm, S1, S2 normal, no murmur, click, rub or gallop  Abdomen: soft, non-tender; bowel sounds normal; no masses,  no organomegaly.  ***Well healed Pfannenstiel incision   Vulva:  normal  Vagina: normal vagina, no discharge, exudate, lesion, or erythema  Cervix:  no cervical motion tenderness and no lesions  Corpus: normal size, contour, position, consistency, mobility, non-tender  Adnexa:  normal adnexa and no mass, fullness, tenderness  Rectal Exam: Not performed.         Labs:  Lab Results  Component Value Date   HGB 10.0 (L) 07/12/2022     Assessment:   No diagnosis found.    Plan:    1. Contraception: {method:5051} 2. Will check Hgb for h/o postpartum anemia of less than 10.  3. Follow up in: {1-10:13787} {time; units:19136} or as needed.    Rubie Maid, MD [facility]

## 2022-10-09 ENCOUNTER — Other Ambulatory Visit: Payer: Self-pay | Admitting: Obstetrics

## 2022-10-09 ENCOUNTER — Ambulatory Visit
Admission: RE | Admit: 2022-10-09 | Discharge: 2022-10-09 | Disposition: A | Discharge status: Home / Self Care | Payer: Managed Care, Other (non HMO) | Source: Ambulatory Visit | Attending: Obstetrics | Admitting: Obstetrics

## 2022-10-09 ENCOUNTER — Ambulatory Visit: Payer: Managed Care, Other (non HMO) | Admitting: Obstetrics and Gynecology

## 2022-10-09 DIAGNOSIS — I82890 Acute embolism and thrombosis of other specified veins: Secondary | ICD-10-CM | POA: Insufficient documentation

## 2022-10-17 ENCOUNTER — Ambulatory Visit: Payer: Managed Care, Other (non HMO)

## 2022-10-18 ENCOUNTER — Telehealth: Payer: Self-pay

## 2022-10-18 NOTE — Telephone Encounter (Signed)
Pt calling triage regarding her bleeding with the nexplanon. Tried to call her back with no answer, sending her a message on mychart

## 2022-10-18 NOTE — Telephone Encounter (Signed)
Patient can either try Estradiol 1 mg for 2 weeks, or try birth control pills for 1 month to help with bleeding (like Junel 20 mcg).  She also has not been seen in the office for some of her other postpartum issues (including the ovarian thromboses) and I would strongly encourage her to do so.

## 2022-10-25 ENCOUNTER — Ambulatory Visit: Admission: RE | Admit: 2022-10-25 | Payer: Managed Care, Other (non HMO) | Source: Ambulatory Visit

## 2022-10-31 ENCOUNTER — Other Ambulatory Visit: Payer: Self-pay

## 2022-10-31 MED ORDER — NORETHIN ACE-ETH ESTRAD-FE 1-20 MG-MCG PO TABS: 1.0000 | ORAL_TABLET | Freq: Every day | ORAL | 0 refills | Status: DC

## 2022-11-06 ENCOUNTER — Ambulatory Visit: Payer: Managed Care, Other (non HMO) | Admitting: Certified Nurse Midwife

## 2022-11-06 ENCOUNTER — Encounter: Payer: Self-pay | Admitting: Certified Nurse Midwife

## 2022-11-06 DIAGNOSIS — R1010 Upper abdominal pain, unspecified: Secondary | ICD-10-CM | POA: Insufficient documentation

## 2022-11-06 DIAGNOSIS — K219 Gastro-esophageal reflux disease without esophagitis: Secondary | ICD-10-CM | POA: Insufficient documentation

## 2022-11-06 NOTE — Patient Instructions (Signed)

## 2022-11-06 NOTE — Progress Notes (Signed)
Subjective:    Michelle Thompson is a 26 y.o. [phone] Caucasian female who presents for a postpartum visit. She is 6 weeks postpartum following a spontaneous vaginal delivery at  [facility] , term pregnancy with pre eclampsia.  . I have fully reviewed the prenatal and intrapartum course. Postpartum course has been normal. Baby's course has been normal. Baby is feeding by  formula . Bleeding no bleeding. Bowel function is normal. Bladder function is normal. Patient is sexually active.  Contraception method is Nexplanon. Postpartum depression screening: negative. Score 4.  Last pap 05/03/2022 and was negative. She complains today of upper abdominal pain for the past few days that has been constant. Notes is is worse fatty diet. She has been eating healthier diet to manage at this time.   The following portions of the patient's history were reviewed and updated as appropriate: allergies, current medications, past medical history, past surgical history and problem list.  Review of Systems Pertinent items are noted in HPI.   Vitals:   11/06/22 1356  BP: 119/74  Pulse: 86  Resp: 16  Weight: 158 lb 12.8 oz (72 kg)  Height: 5\' 2"  (1.575 m)   No LMP recorded.  Objective:   General:  alert, cooperative and no distress   Breasts:  deferred, no complaints  Lungs: clear to auscultation bilaterally  Heart:  regular rate and rhythm  Abdomen: soft, nontender   Vulva: normal  Vagina: normal vagina  Cervix:  closed  Corpus: Well-involuted  Adnexa:  Non-palpable  Rectal Exam: no hemorrhoids        Assessment:   Postpartum exam 6 wks s/p SVD Bottle feeding Depression screening Contraception counseling   Plan:  : Nexplanon Follow up in: 9 months for annual exam  or earlier if needed. Orders placed for referral to GI for abdominal pain , GERD.   Philip Aspen, CNM

## 2022-11-08 ENCOUNTER — Ambulatory Visit: Payer: Managed Care, Other (non HMO)

## 2022-11-19 ENCOUNTER — Other Ambulatory Visit: Payer: Self-pay

## 2022-11-19 MED ORDER — NORETHIN ACE-ETH ESTRAD-FE 1-20 MG-MCG PO TABS: 1.0000 | ORAL_TABLET | Freq: Every day | ORAL | 0 refills | Status: DC

## 2022-12-17 ENCOUNTER — Ambulatory Visit: Payer: Managed Care, Other (non HMO) | Admitting: Certified Nurse Midwife

## 2023-01-02 ENCOUNTER — Other Ambulatory Visit: Payer: Self-pay | Admitting: Obstetrics and Gynecology

## 2023-01-03 MED ORDER — NORETHIN ACE-ETH ESTRAD-FE 1-20 MG-MCG PO TABS: 1.0000 | ORAL_TABLET | Freq: Every day | ORAL | 0 refills | Status: DC

## 2023-01-10 ENCOUNTER — Ambulatory Visit (INDEPENDENT_AMBULATORY_CARE_PROVIDER_SITE_OTHER): Payer: Managed Care, Other (non HMO) | Admitting: Certified Nurse Midwife

## 2023-01-10 ENCOUNTER — Encounter: Payer: Self-pay | Admitting: Certified Nurse Midwife

## 2023-01-10 ENCOUNTER — Ambulatory Visit: Payer: Managed Care, Other (non HMO) | Admitting: Certified Nurse Midwife

## 2023-01-10 VITALS — BP 132/85 | HR 86 | Resp 16 | Wt 165.2 lb

## 2023-01-10 DIAGNOSIS — R300 Dysuria: Secondary | ICD-10-CM

## 2023-01-10 DIAGNOSIS — R1013 Epigastric pain: Secondary | ICD-10-CM

## 2023-01-10 DIAGNOSIS — R350 Frequency of micturition: Secondary | ICD-10-CM

## 2023-01-10 DIAGNOSIS — M549 Dorsalgia, unspecified: Secondary | ICD-10-CM

## 2023-01-10 DIAGNOSIS — N939 Abnormal uterine and vaginal bleeding, unspecified: Secondary | ICD-10-CM | POA: Diagnosis not present

## 2023-01-10 NOTE — Patient Instructions (Signed)
Contraceptive Implant Information A contraceptive implant is a small, plastic rod that is inserted under the skin of the upper arm. The implant releases a hormone into the bloodstream that prevents pregnancy. They do not protect against STIs (sexually transmitted infections). How does the implant work? Contraceptive implants prevent pregnancy by releasing a small amount of progestin into the bloodstream. Progestin has similar effects to the hormone progesterone, which plays a role in menstrual periods and pregnancy. Progestin will: Stop the ovaries from releasing eggs. Thicken cervical mucus to prevent sperm from entering the cervix. Thin out the lining of the uterus to prevent a fertilized egg from attaching to the wall of the uterus. What are the advantages of this form of birth control? The advantages of this form of birth control include the following: It is very effective at preventing pregnancy. It is effective for up to 3 years. It does not interfere with sex or daily activities. It cannot be seen by others, but it can be felt. It can be used when breastfeeding. It can be used by women who cannot take estrogen. The procedure to insert the device is quick. It can also be easily removed. Women can get pregnant shortly after removing the device. What are the disadvantages of this form of birth control? The disadvantages of this form of birth control include the following: It can cause side effects, including: Changes in your bleeding pattern, such as: Bleeding for a longer or shorter time during your menstrual period. No bleeding at all during the time of your menstrual period. Spotting between your menstrual periods. Changes in the amount of time between your menstrual periods. Headache. Weight gain. Acne. Breast tenderness. Abdominal or back pain. Mood changes, such as depression. It does not protect against STIs. You must make an office visit to have it inserted and removed by a  trained clinician. Inserting or removing the device can result in pain, scarring, and tissue or nerve damage. This is rare. How is this implant inserted? The procedure to insert an implant only takes a few minutes. Before the procedure: You should talk with your health care provider about when to schedule the procedure. You may have to take a pregnancy test. This involves having a urine sample taken. During the procedure: Your upper arm will be numbed with a numbing medicine (local anesthetic). The implant will be injected under the skin of your upper arm with a needle. After the procedure: You may experience minor bruising, swelling, or discomfort at the insertion site. This should only last for a couple of days. You may need to use another, nonhormonal contraceptive such as a condom for 7 days after the procedure. How is the implant removed? The implant should be removed after 3 years or as directed by your health care provider. The procedure to remove the implant only takes a few minutes. During this procedure: Your upper arm will be numbed with a numbing medicine (local anesthetic). A small incision will be made near the implant. The implant will be removed with a small pair of forceps. After the implant is removed: The effect of the implant will wear off in a few hours. Some women may be able to get pregnant as early as one week after the removal. A new implant can be inserted as soon as the old one is removed, if desired. You may get minor bruising, swelling, or discomfort at the removal site. This should only last for a couple of days. Is this implant right for me?  Your health care provider can help you determine whether you are a good candidate for a contraceptive implant. Make sure to discuss the possible side effects with your health care provider. You should not get the implant if you: Are pregnant. Are allergic to any part of the implant. Have a history of: Breast  cancer. Abnormal bleeding from the vagina. Liver disease or tumors. If you have any of the following conditions, you should talk with your health care provider to see if a contraceptive implant is right for you: Diabetes. Blood clots High cholesterol. Headaches. Gallbladder or kidney problems. Depression. High blood pressure. Allergic reactions to medicines. Follow these instructions at home: If you cannot feel the implant, contact your health care provider. Following the insertion, you will have to wear a pressure bandage for 24 hours and a small bandage for 3-5 days. Summary A contraceptive implant is a small, plastic rod that is inserted under the skin of the upper arm. The implant releases a hormone into the bloodstream that prevents pregnancy. Contraceptive implants can be effective for up to 3 years. The implant works by preventing ovaries from releasing eggs, thickening the cervical mucus, and thinning the uterine wall. This form of birth control is very effective at preventing pregnancy and can be inserted and removed quickly. Women can get pregnant shortly after the device is removed. This form of birth control can cause some side effects, including weight gain, breast tenderness, headaches, irregular menstrual periods or bleeding, acne, abdominal or back pain, and depression. It does not protect against STIs (sexually transmitted infections). This information is not intended to replace advice given to you by your health care provider. Make sure you discuss any questions you have with your health care provider. Document Revised: 06/29/2020 Document Reviewed: 06/29/2020 Elsevier Patient Education  [place].

## 2023-01-10 NOTE — Progress Notes (Signed)
GYN ENCOUNTER NOTE  Subjective:       Michelle Thompson is a 27 y.o. [phone] female is here for gynecologic evaluation of the following issues:  1. Abnormal vaginal bleeding .  Has since stopped  2. epigastric pain  3  increase urinary frequency 4. Bump right arm pit   Gynecologic History Patient's last menstrual period was 12/17/2022 (exact date). Contraception: Nexplanon Last Pap: 05/03/2022. Results were: normal Last mammogram: n/a.     Obstetric History OB History  Gravida Para Term Preterm AB Living  4 3 2 1   3   SAB IAB Ectopic Multiple Live Births        0 3    # Outcome Date GA Lbr Len/2nd Weight Sex Delivery Anes PTL Lv  4 Gravida           3 Preterm 2022     Vag-Spont   LIV  2 Term 07/20/20 [redacted]w[redacted]d 11:50 / 00:07 6 lb 6.5 oz (2.906 kg) M Vag-Spont EPI  LIV     Birth Comments:  wnl  1 Term 01/14/16    M Vag-Spont   LIV    Past Medical History:  Diagnosis Date   Complication of anesthesia    fentanyl itching   Hypertension    Gestational Hypertension    Past Surgical History:  Procedure Laterality Date   NO PAST SURGERIES      Current Outpatient Medications on File Prior to Visit  Medication Sig Dispense Refill   acetaminophen (TYLENOL) 500 MG tablet Take 1,000 mg by mouth every 6 (six) hours as needed.     acyclovir (ZOVIRAX) 400 MG tablet Take 1 tablet (400 mg total) by mouth 3 (three) times daily. 90 tablet 3   aspirin EC 81 MG tablet Take 1 tablet (81 mg total) by mouth daily. Swallow whole. 30 tablet 5   Etonogestrel (NEXPLANON [place]) Inject into the skin.     ferrous sulfate 325 (65 FE) MG EC tablet Take by mouth.     ibuprofen (ADVIL) 600 MG tablet Take 600 mg by mouth every 6 (six) hours as needed.     Naproxen 375 MG TBEC Take 1 tablet by mouth 2 (two) times daily as needed.     NIFEdipine (PROCARDIA-XL/NIFEDICAL-XL) 30 MG 24 hr tablet Take 1 tablet (30 mg total) by mouth daily. Can increase to twice a day as needed for symptomatic contractions 30 tablet  2   prenatal vitamin w/FE, FA (PRENATAL 1 + 1) 27-1 MG TABS tablet Take 1 tablet by mouth daily at 12 noon.     norethindrone-ethinyl estradiol-FE (JUNEL FE 1/20) 1-20 MG-MCG tablet Take 1 tablet by mouth daily. (Patient not taking: Reported on 01/10/2023) 28 tablet 0   [DISCONTINUED] Hyoscyamine Sulfate SL (LEVSIN/SL) 0.125 MG SUBL Place 1 tablet under the tongue every 12 (twelve) hours as needed (abdominal cramping). 10 tablet 0   No current facility-administered medications on file prior to visit.    Allergies  Allergen Reactions   Fentanyl Shortness Of Breath and Itching    7.11.2022 Pt reports that she was having anxiety and this was the reason for shortness of breath.    Social History   Socioeconomic History   Marital status: Married    Spouse name: Not on file   Number of children: Not on file   Years of education: Not on file   Highest education level: Not on file  Occupational History   Not on file  Tobacco Use   Smoking status: Never  Smokeless tobacco: Never  Vaping Use   Vaping Use: Never used  Substance and Sexual Activity   Alcohol use: No   Drug use: No   Sexual activity: Yes    Partners: Male    Birth control/protection: Surgical    Comment: BTL  Other Topics Concern   Not on file  Social History Narrative   Not on file   Social Determinants of Health   Financial Resource Strain: Not on file  Food Insecurity: No Food Insecurity (07/04/2021)   Hunger Vital Sign    Worried About Running Out of Food in the Last Year: Never true    Ran Out of Food in the Last Year: Never true  Transportation Needs: No Transportation Needs (07/04/2021)   PRAPARE - Hydrologist (Medical): No    Lack of Transportation (Non-Medical): No  Physical Activity: Not on file  Stress: Not on file  Social Connections: Not on file  Intimate Partner Violence: Not on file    Family History  Problem Relation Age of Onset   Lupus Mother    Healthy Father     Asthma Brother    Diabetes Maternal Grandmother    COPD Maternal Grandmother    Cancer Maternal Grandmother    Diabetes Maternal Grandfather    Stroke Maternal Grandfather     The following portions of the patient's history were reviewed and updated as appropriate: allergies, current medications, past family history, past medical history, past social history, past surgical history and problem list.  Review of Systems Review of Systems - Negative except as mentioned in HPI Review of Systems - General ROS: negative for - chills, fatigue, fever, hot flashes, malaise or night sweats Hematological and Lymphatic ROS: negative for - bleeding problems or swollen lymph nodes. Bump under right arm  Gastrointestinal ROS: negative for - abdominal pain, blood in stools, change in bowel habits and nausea/vomiting Musculoskeletal ROS: negative for - joint pain, muscle pain or muscular weakness Genito-Urinary ROS: negative for - dysmenorrhea, dyspareunia, dysuria, genital discharge, genital ulcers, hematuria, incontinence, irregular/heavy menses, nocturia or pelvic pain. Positive for change in menstrual cycle, urinary frequency  Objective:   BP 132/85   Pulse 86   Resp 16   Wt 165 lb 3.2 oz (74.9 kg)   LMP 12/17/2022 (Exact Date)   BMI 30.22 kg/m  CONSTITUTIONAL: Well-developed, well-nourished female in no acute distress.  HENT:  Normocephalic, atraumatic.  NECK: Normal range of motion, supple, no masses.  Normal thyroid.  SKIN: Skin is warm and dry. No rash noted. Not diaphoretic. No erythema. No pallor. Sebaceous cyst , no redness or pus , a pea size.  Kidney , Mild CVA tenderness right side NEUROLGIC: Alert and oriented to person, place, and time. PSYCHIATRIC: Normal mood and affect. Normal behavior. Normal judgment and thought content. CARDIOVASCULAR:Not Examined RESPIRATORY: Not Examined BREASTS: Not Examined ABDOMEN: Soft, non distended; Non tender.  No Organomegaly. PELVIC: not  indicated MUSCULOSKELETAL: Normal range of motion. No tenderness.  No cyanosis, clubbing, or edema.   Assessment:  Sebaceous cyst  Epigastric pain  Urinary frequency  Abnormal uterine bleeding   Plan:   Discussed increased/irregular bleeding common with nexplanon birth control. Given the bleeding has stopped no intervention needed at this time. Discussed keep area under her right arm clean and dry. Warning signs for abscess that may need to be I&D, not indicated at this time. Labs placed for CMP and urine culture. Will follow up with results. Pt is in agreement  to plan .   Philip Aspen, CNM

## 2023-01-11 ENCOUNTER — Encounter: Payer: Self-pay | Admitting: Licensed Practical Nurse

## 2023-01-11 ENCOUNTER — Other Ambulatory Visit: Payer: Self-pay | Admitting: Licensed Practical Nurse

## 2023-01-11 ENCOUNTER — Telehealth: Payer: Self-pay

## 2023-01-11 DIAGNOSIS — R300 Dysuria: Secondary | ICD-10-CM

## 2023-01-11 LAB — COMPREHENSIVE METABOLIC PANEL
ALT: 22 IU/L (ref 0–32)
AST: 19 IU/L (ref 0–40)
Albumin/Globulin Ratio: 1.5 (ref 1.2–2.2)
Albumin: 4.2 g/dL (ref 4.0–5.0)
Alkaline Phosphatase: 83 IU/L (ref 44–121)
BUN/Creatinine Ratio: 15 (ref 9–23)
BUN: 12 mg/dL (ref 6–20)
Bilirubin Total: 0.4 mg/dL (ref 0.0–1.2)
CO2: 19 mmol/L — ABNORMAL LOW (ref 20–29)
Calcium: 9.4 mg/dL (ref 8.7–10.2)
Chloride: 104 mmol/L (ref 96–106)
Creatinine, Ser: 0.78 mg/dL (ref 0.57–1.00)
Globulin, Total: 2.8 g/dL (ref 1.5–4.5)
Glucose: 77 mg/dL (ref 70–99)
Potassium: 4.3 mmol/L (ref 3.5–5.2)
Sodium: 137 mmol/L (ref 134–144)
Total Protein: 7 g/dL (ref 6.0–8.5)
eGFR: 109 mL/min/{1.73_m2} (ref >59)

## 2023-01-11 IMAGING — MR MR HEAD W/O CM
13 of 14 series · 44 of 48 positions shown · non-contrast
Comparison: No prior MRI, correlation is made with CT head
06/08/2020

CLINICAL DATA: Right-sided headache that started yesterday, 26
weeks pregnant.

EXAM:
MRI HEAD WITHOUT CONTRAST
MR VENOGRAM HEAD WITHOUT CONTRAST
TECHNIQUE: Multiplanar, multiecho pulse sequences of the brain and surrounding
structures were obtained without intravenous contrast.
Angiographic images of the intracranial venous structures were
acquired using MRV technique without intravenous contrast.

[Series 5: DWI · axial · 3.0mm · 0.88mm/px · z∈[-152,-28]mm · 7 of 96 slices shown (1 of 4)]
[im 1/96]
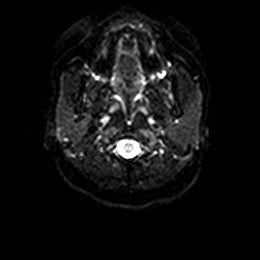
[im 16/96]
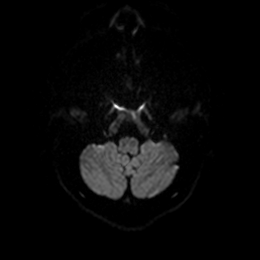
[im 32/96]
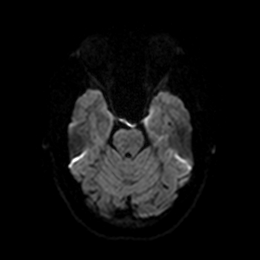
[im 48/96]
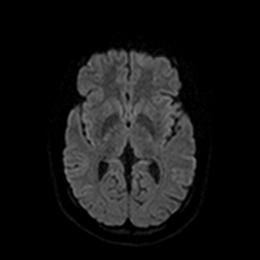
[im 64/96]
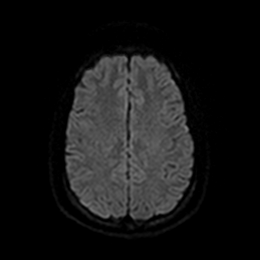
[im 80/96]
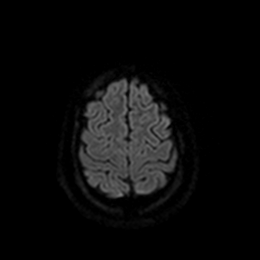
[im 96/96]
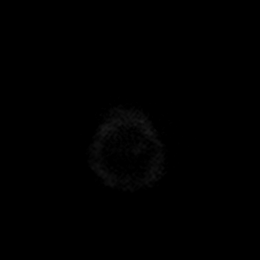

[Series 6: DWI · axial · 3.0mm · 0.88mm/px · z∈[-152,-28]mm · 4 of 48 slices shown (2 of 4)]
[im 1/48]
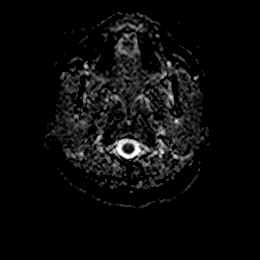
[im 16/48]
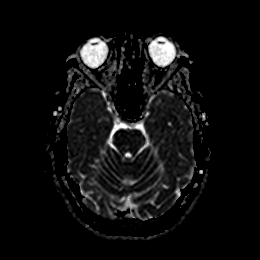
[im 32/48]
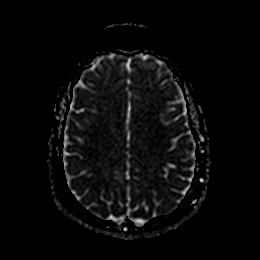
[im 48/48]
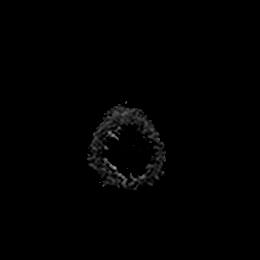

[Series 7: DWI · coronal · 4.0mm · 0.88mm/px · 5 of 68 slices shown (3 of 4)]
[im 1/68]
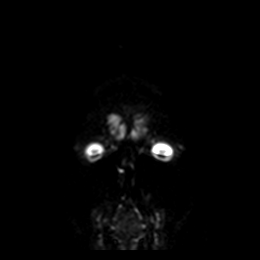
[im 17/68]
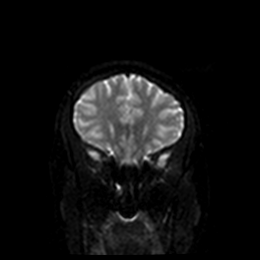
[im 34/68]
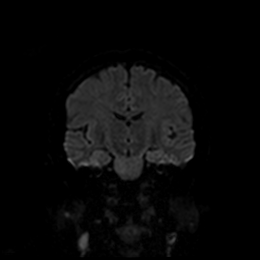
[im 51/68]
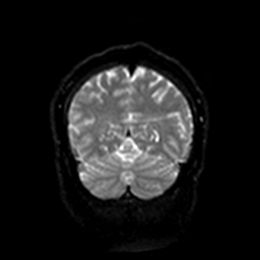
[im 68/68]
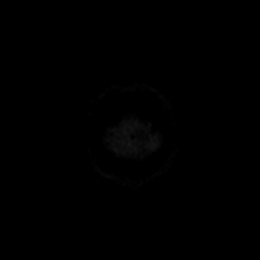

[Series 8: DWI · coronal · 4.0mm · 0.88mm/px · 3 of 34 slices shown (4 of 4)]
[im 1/34]
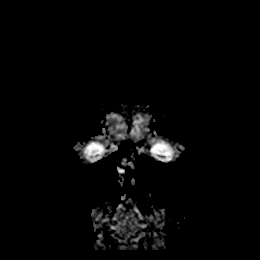
[im 17/34]
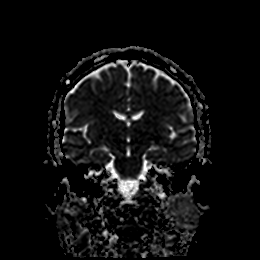
[im 34/34]
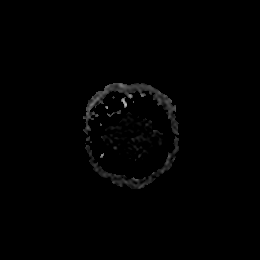

[Series 9: T1 · sagittal · 5.0mm · 0.75mm/px · 2 of 23 slices shown]
[im 1/23]
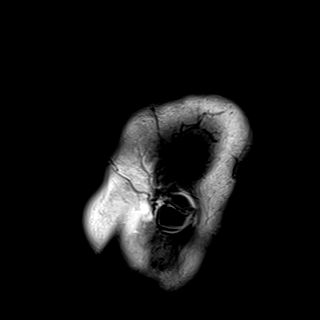
[im 23/23]
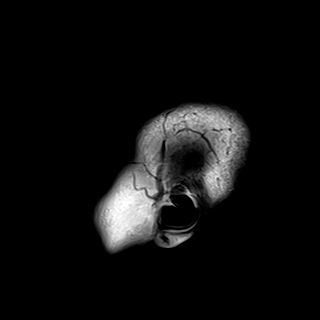

[Series 10: T2 · axial · 5.0mm · 0.72mm/px · z∈[-154,-26]mm · 2 of 25 slices shown (1 of 3)]
[im 1/25]
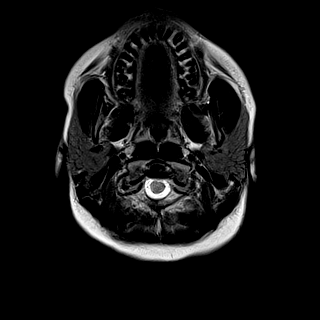
[im 25/25]
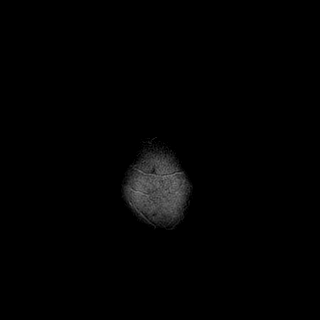

[Series 11: FLAIR · axial · 5.0mm · 0.45mm/px · z∈[-153,-25]mm · 2 of 25 slices shown]
[im 1/25]
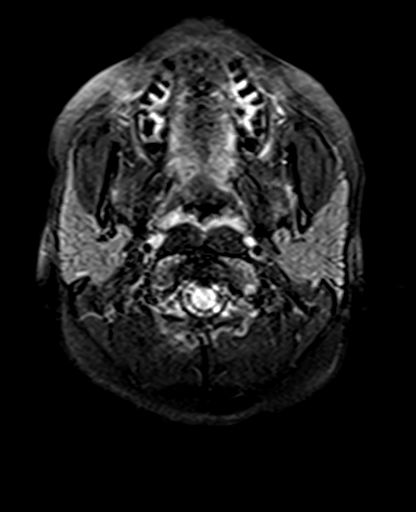
[im 25/25]
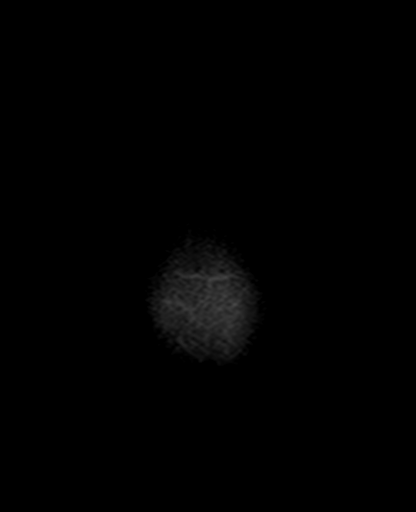

[Series 12: mag_images · axial · 3.0mm · 0.90mm/px · z∈[-157,-21]mm · 4 of 52 slices shown]
[im 1/52]
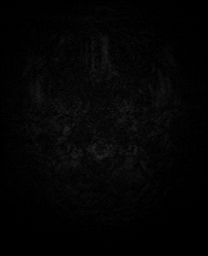
[im 18/52]
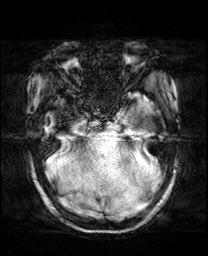
[im 35/52]
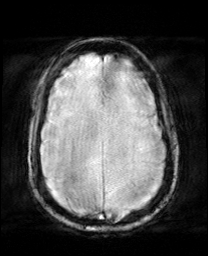
[im 52/52]
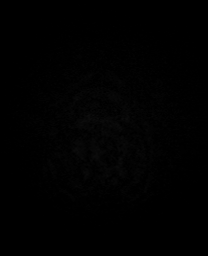

[Series 13: pha_images · axial · 3.0mm · 0.90mm/px · z∈[-157,-24]mm · 4 of 51 slices shown]
[im 1/51]
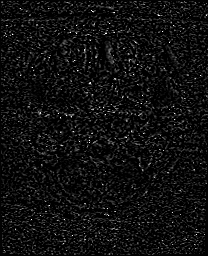
[im 17/51]
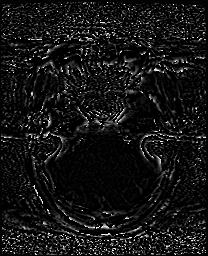
[im 34/51]
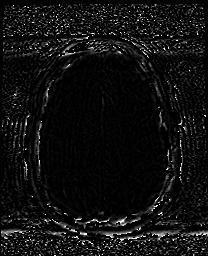
[im 51/51]
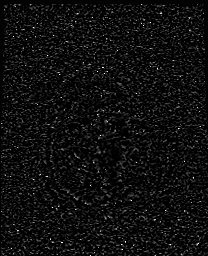

[Series 14: swi_images · axial · 3.0mm · 0.90mm/px · z∈[-157,-21]mm · 4 of 52 slices shown]
[im 1/52]
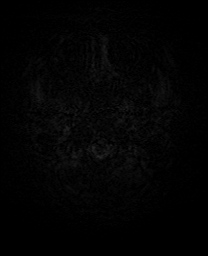
[im 18/52]
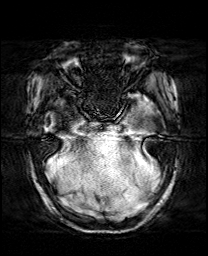
[im 35/52]
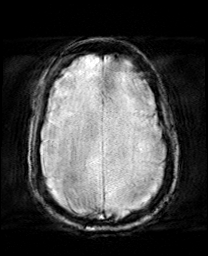
[im 52/52]
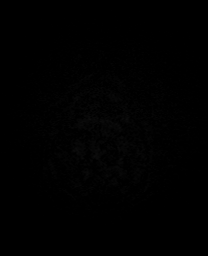

[Series 15: mip_images(sw) · axial · 24.0mm · 0.90mm/px · z∈[-147,-30]mm · 3 of 45 slices shown]
[im 1/45]
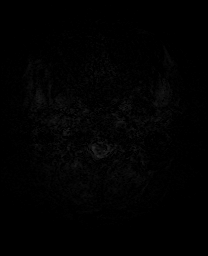
[im 23/45]
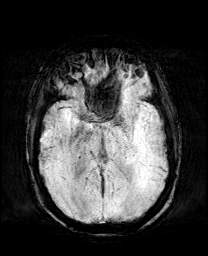
[im 45/45]
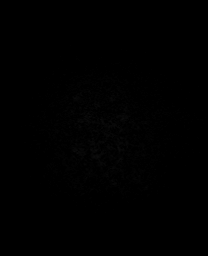

[Series 17: T2 · coronal · 5.0mm · 0.34mm/px · 2 of 29 slices shown (2 of 3)]
[im 1/29]
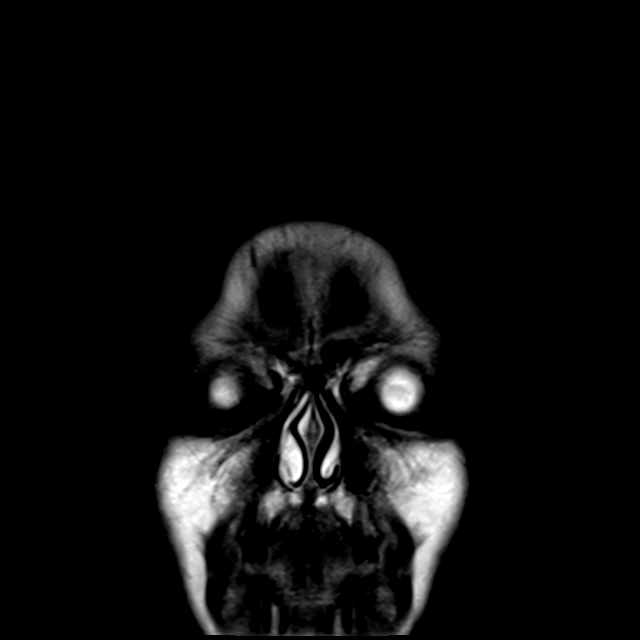
[im 29/29]
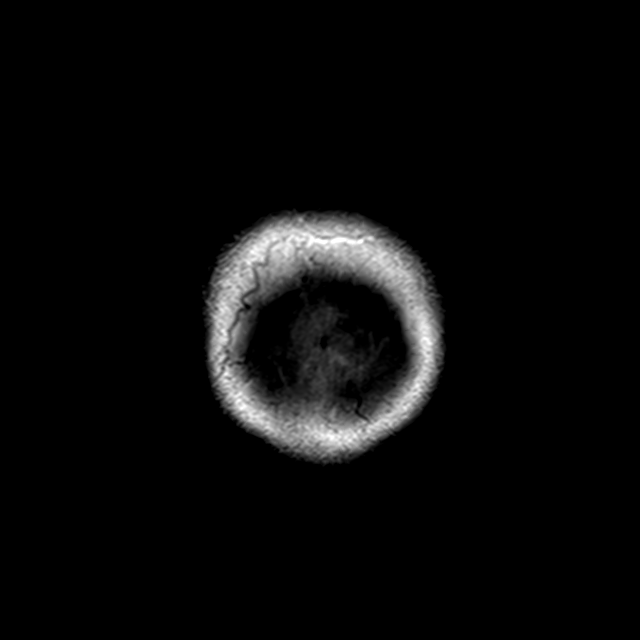

[Series 18: T2 · axial · 5.0mm · 0.72mm/px · z∈[-154,-26]mm · 2 of 25 slices shown (3 of 3)]
[im 1/25]
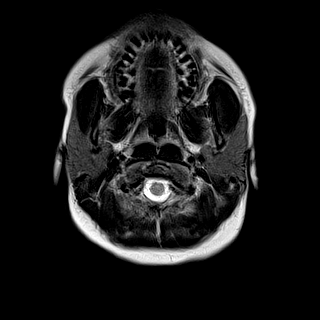
[im 25/25]
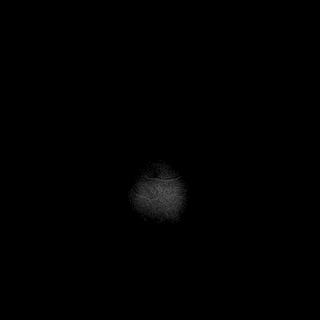

[44 of 48 positions shown; findings below may reference images not displayed]

FINDINGS: MRI HEAD FINDINGS

Evaluation is somewhat limited by motion artifact.

Brain: No restricted diffusion to suggest acute or subacute infarct.
No acute hemorrhage mass, mass effect, or midline shift. No
hemosiderin deposition to suggest remote hemorrhage. No
hydrocephalus or extra-axial collection.

Vascular: Normal arterial flow voids.

Skull and upper cervical spine: Normal marrow signal.

Sinuses/Orbits: No acute finding.

Other: The mastoids are well aerated.

MRV HEAD FINDINGS

Superior sagittal sinus: Normal.

Straight sinus: Normal.

Inferior sagittal sinus, vein of [place]rey[place] and internal cerebral veins:
Normal.

Transverse sinuses: Normal.  Right dominant.

Sigmoid sinuses: Normal.

Visualized jugular veins: Normal
IMPRESSION: 1. Evaluation is somewhat limited by motion artifact. Within this
limitation, no acute intracranial process. No etiology is seen for
the patient's headache.
2. There is no evidence of dural venous sinus or deep cerebral vein
thrombosis.

## 2023-01-11 MED ORDER — NITROFURANTOIN MONOHYD MACRO 100 MG PO CAPS: 100.0000 mg | ORAL_CAPSULE | Freq: Two times a day (BID) | ORAL | 1 refills | Status: DC

## 2023-01-11 NOTE — Telephone Encounter (Signed)
Patient contacted office requesting antibiotic to be sent to her pharmacy to treat for  UTI. 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 Patient reports that she was seen in office by Philip Aspen on 01/10/23 and states that she had mentioned in visit that she has had back pain,urine culture was sent for testing. Patient states today she is experiencing symptoms of dysuria and frequency. I advised patient that it can take up to 48hrs to get back results or urine culture. Patient request antibiotic to be sent to [facility] in [place]. Patient was advised that Philip Aspen is not in office until 01/14/23. Please review chart and advise. KW

## 2023-01-12 LAB — URINE CULTURE

## 2023-01-14 ENCOUNTER — Encounter: Payer: Self-pay | Admitting: Certified Nurse Midwife

## 2023-02-05 ENCOUNTER — Other Ambulatory Visit: Payer: Self-pay | Admitting: Certified Nurse Midwife

## 2023-02-05 MED ORDER — VALACYCLOVIR HCL 1 G PO TABS: 1000.0000 mg | ORAL_TABLET | Freq: Every day | ORAL | 5 refills | Status: DC

## 2023-02-05 NOTE — Progress Notes (Signed)
Pt request to go back to valtrex once daily dosing. Orders placed.   Philip Aspen, CNM

## 2023-03-21 ENCOUNTER — Encounter: Payer: Self-pay | Admitting: Gastroenterology

## 2023-03-21 ENCOUNTER — Ambulatory Visit: Payer: Managed Care, Other (non HMO) | Admitting: Gastroenterology

## 2023-03-21 NOTE — Progress Notes (Deleted)
Gastroenterology Consultation  Referring Provider:     [facility] * Primary Care Physician:  No primary care provider on file. Primary Gastroenterologist:  Dr. Allen Norris     Reason for Consultation:     Abdominal pain        HPI:   Michelle Thompson is a 27 y.o. y/o female referred for consultation & management of abdominal pain by Dr. Rayne Du primary care provider on file..  This patient comes in today after having a history of abdominal pain back in November.  The patient appears to also have had abdominal pain with an ultrasound being negative back when she was 27 years old.  Past Medical History:  Diagnosis Date   Complication of anesthesia    fentanyl itching   Hypertension    Gestational Hypertension    Past Surgical History:  Procedure Laterality Date   NO PAST SURGERIES      Prior to Admission medications   Medication Sig Start Date End Date Taking? Authorizing Provider  acetaminophen (TYLENOL) 500 MG tablet Take 1,000 mg by mouth every 6 (six) hours as needed.    [provider]  aspirin EC 81 MG tablet Take 1 tablet (81 mg total) by mouth daily. Swallow whole. 05/03/22   Lurlean Horns, CNM  Etonogestrel (NEXPLANON [place]) Inject into the skin.    [provider]  ferrous sulfate 325 (65 FE) MG EC tablet Take by mouth.    [provider]  ibuprofen (ADVIL) 600 MG tablet Take 600 mg by mouth every 6 (six) hours as needed. 09/03/22   [provider]  Naproxen 375 MG TBEC Take 1 tablet by mouth 2 (two) times daily as needed. 12/14/22   [provider]  NIFEdipine (PROCARDIA-XL/NIFEDICAL-XL) 30 MG 24 hr tablet Take 1 tablet (30 mg total) by mouth daily. Can increase to twice a day as needed for symptomatic contractions 09/13/22   Lurlean Horns, CNM  nitrofurantoin, macrocrystal-monohydrate, (MACROBID) 100 MG capsule Take 1 capsule (100 mg total) by mouth 2 (two) times daily. 01/11/23   Dominic, Nunzio Cobbs, CNM  prenatal vitamin  w/FE, FA (PRENATAL 1 + 1) 27-1 MG TABS tablet Take 1 tablet by mouth daily at 12 noon.    [provider]  valACYclovir (VALTREX) 1000 MG tablet Take 1 tablet (1,000 mg total) by mouth daily. 02/05/23   Philip Aspen, CNM  Hyoscyamine Sulfate SL (LEVSIN/SL) 0.125 MG SUBL Place 1 tablet under the tongue every 12 (twelve) hours as needed (abdominal cramping). 06/22/21 06/22/21  Seabron Spates, CNM    Family History  Problem Relation Age of Onset   Lupus Mother    Healthy Father    Asthma Brother    Diabetes Maternal Grandmother    COPD Maternal Grandmother    Cancer Maternal Grandmother    Diabetes Maternal Grandfather    Stroke Maternal Grandfather      Social History   Tobacco Use   Smoking status: Never   Smokeless tobacco: Never  Vaping Use   Vaping Use: Never used  Substance Use Topics   Alcohol use: No   Drug use: No    Allergies as of 03/21/2023 - Review Complete 01/10/2023  Allergen Reaction Noted   Fentanyl Shortness Of Breath and Itching 07/20/2020    Review of Systems:    All systems reviewed and negative except where noted in HPI.   Physical Exam:  There were no vitals taken for this visit. No LMP recorded. General:  Alert,  Well-developed, ***well-nourished, pleasant and cooperative in NAD Head:  Normocephalic and atraumatic. Eyes:  Sclera clear, no icterus.   Conjunctiva pink. Ears:  Normal auditory acuity. Neck:  Supple; no masses or thyromegaly. Lungs:  Respirations even and unlabored.  Clear throughout to auscultation.   No wheezes, crackles, or rhonchi. No acute distress. Heart:  Regular rate and rhythm; no murmurs, clicks, rubs, or gallops. Abdomen:  Normal bowel sounds.  No bruits.  Soft, non-tender and non-distended without masses, hepatosplenomegaly or hernias noted.  No guarding or rebound tenderness.  ***Negative Carnett sign.   Rectal:  Deferred.***  Pulses:  Normal pulses noted. Extremities:  No clubbing or edema.  No  cyanosis. Neurologic:  Alert and oriented x3;  grossly normal neurologically. Skin:  Intact without significant lesions or rashes.  ***No jaundice. Lymph Nodes:  No significant cervical adenopathy. Psych:  Alert and cooperative. Normal mood and affect.  Imaging Studies: No results found.  Assessment and Plan:   Michelle Thompson is a 27 y.o. y/o female ***    Lucilla Lame, MD. Marval Regal    Note: This dictation was prepared with Dragon dictation along with smaller phrase technology. Any transcriptional errors that result from this process are unintentional.

## 2023-04-04 ENCOUNTER — Encounter: Payer: Self-pay | Admitting: Obstetrics and Gynecology

## 2023-04-04 ENCOUNTER — Other Ambulatory Visit (HOSPITAL_COMMUNITY)
Admission: RE | Admit: 2023-04-04 | Discharge: 2023-04-04 | Disposition: A | Discharge status: Home / Self Care | Payer: Managed Care, Other (non HMO) | Source: Ambulatory Visit | Attending: Obstetrics and Gynecology | Admitting: Obstetrics and Gynecology

## 2023-04-04 ENCOUNTER — Ambulatory Visit (INDEPENDENT_AMBULATORY_CARE_PROVIDER_SITE_OTHER): Payer: Managed Care, Other (non HMO) | Admitting: Obstetrics and Gynecology

## 2023-04-04 VITALS — BP 110/74 | Ht 62.0 in | Wt 171.0 lb

## 2023-04-04 DIAGNOSIS — Z124 Encounter for screening for malignant neoplasm of cervix: Secondary | ICD-10-CM | POA: Insufficient documentation

## 2023-04-04 DIAGNOSIS — F32A Depression, unspecified: Secondary | ICD-10-CM

## 2023-04-04 DIAGNOSIS — Z3046 Encounter for surveillance of implantable subdermal contraceptive: Secondary | ICD-10-CM

## 2023-04-04 DIAGNOSIS — Z113 Encounter for screening for infections with a predominantly sexual mode of transmission: Secondary | ICD-10-CM | POA: Diagnosis present

## 2023-04-04 DIAGNOSIS — N926 Irregular menstruation, unspecified: Secondary | ICD-10-CM

## 2023-04-04 DIAGNOSIS — R102 Pelvic and perineal pain: Secondary | ICD-10-CM

## 2023-04-04 DIAGNOSIS — D500 Iron deficiency anemia secondary to blood loss (chronic): Secondary | ICD-10-CM | POA: Insufficient documentation

## 2023-04-04 MED ORDER — SERTRALINE HCL 50 MG PO TABS: ORAL_TABLET | ORAL | 1 refills | Status: DC

## 2023-04-04 NOTE — Patient Instructions (Signed)
I value your feedback and you entrusting us with your care. If you get a [facility] patient survey, I would appreciate you taking the time to let us know about your experience today. Thank you!  Remove the dressing in 24 hours,  keep the incision area dry for 24 hours and remove the Steristrip in 2-3  days.  Notify us if any signs of tenderness, redness, pain, or fevers develop.   

## 2023-04-04 NOTE — Progress Notes (Addendum)
Pcp, No   Chief Complaint  Patient presents with   Vaginal Bleeding    Some cramping entire pelvic area, wants STD testing   Nexplanon removal    Not interested in new [facility]    HPI:      [name] is a 27 y.o. [phone] whose LMP was No LMP recorded. Patient has had an implant., presents today for menorrhagia and pelvic pain since nexplanon insertion, done after delivery 9/23 since they didn't do her TL. Pt had nexplanon in past with amenorrhea. Has had irregular and heavy bleeding since insertion. Changes products Q30 min on heavy days, with small clots; bleeding lasts 1-2 wks. Pt would like it removed to give body break from hormones, see what periods do, and then schedule BTL. Pt with hx of blood clot in ovary at delivery, on blood thinners for awhile; no longer on meds. Pt also under extra stress with work/school/fam. Hx of IDA 9/23; not taking Fe supp.   Pt also with hx of HSV 2, diagnosed a year ago. Taking daily valtrex now due to frequent outbreaks with sx control. Still upset about dx, which causes increased stress. Would like full STD testing.   Pt with hx of LGSIL/neg HPV DNA on pap 1/22; neg pap/neg HPV DNA 5/23; would like repeat due to concern of this.   Pt with anxiety/depression sx recently; feels like she needs medication. Had PP depression about 7 yrs ago that resolved. Did sertraline for 2 wks then stopped it; no side effects. Sx more significant now; has seen therapist and plans to go back again. Not able to exercise much. Has busy life with kids, work, school, husband. Not much time for herself. No SI. Worry is biggest sx, able to sleep well at night.    Patient Active Problem List   Diagnosis Date Noted   Iron deficiency anemia due to chronic blood loss 04/04/2023   Anxiety and depression 04/04/2023   GERD (gastroesophageal reflux disease) 11/06/2022   Upper abdominal pain 11/06/2022   LGSIL on Pap smear of cervix 01/17/2021   GBS (group B streptococcus)  UTI complicating pregnancy [id number]   Chronic hypertension affecting pregnancy 06/22/2020    Past Surgical History:  Procedure Laterality Date   NO PAST SURGERIES      Family History  Problem Relation Age of Onset   Lupus Mother    Healthy Father    Asthma Brother    Diabetes Maternal Grandmother    COPD Maternal Grandmother    Cancer Maternal Grandmother        unknown type   Diabetes Maternal Grandfather    Stroke Maternal Grandfather     Social History   Socioeconomic History   Marital status: Married    Spouse name: Not on file   Number of children: Not on file   Years of education: Not on file   Highest education level: Not on file  Occupational History   Not on file  Tobacco Use   Smoking status: Never   Smokeless tobacco: Never  Vaping Use   Vaping Use: Never used  Substance and Sexual Activity   Alcohol use: No   Drug use: No   Sexual activity: Yes    Partners: Male    Birth control/protection: Implant  Other Topics Concern   Not on file  Social History Narrative   Not on file   Social Determinants of Health   Financial Resource Strain: Not on file  Food Insecurity: No Food  Insecurity (07/04/2021)   Hunger Vital Sign    Worried About Running Out of Food in the Last Year: Never true    Ran Out of Food in the Last Year: Never true  Transportation Needs: No Transportation Needs (07/04/2021)   PRAPARE - Hydrologist (Medical): No    Lack of Transportation (Non-Medical): No  Physical Activity: Not on file  Stress: Not on file  Social Connections: Not on file  Intimate Partner Violence: Not on file    Outpatient Medications Prior to Visit  Medication Sig Dispense Refill   acetaminophen (TYLENOL) 500 MG tablet Take 1,000 mg by mouth every 6 (six) hours as needed.     Etonogestrel (NEXPLANON [place]) Inject into the skin.     ibuprofen (ADVIL) 600 MG tablet Take 600 mg by mouth every 6 (six) hours as needed.     Naproxen 375  MG TBEC Take 1 tablet by mouth 2 (two) times daily as needed.     valACYclovir (VALTREX) 1000 MG tablet Take 1 tablet (1,000 mg total) by mouth daily. 30 tablet 5   ferrous sulfate 325 (65 FE) MG EC tablet Take by mouth. (Patient not taking: Reported on 04/04/2023)     aspirin EC 81 MG tablet Take 1 tablet (81 mg total) by mouth daily. Swallow whole. 30 tablet 5   NIFEdipine (PROCARDIA-XL/NIFEDICAL-XL) 30 MG 24 hr tablet Take 1 tablet (30 mg total) by mouth daily. Can increase to twice a day as needed for symptomatic contractions 30 tablet 2   nitrofurantoin, macrocrystal-monohydrate, (MACROBID) 100 MG capsule Take 1 capsule (100 mg total) by mouth 2 (two) times daily. 14 capsule 1   prenatal vitamin w/FE, FA (PRENATAL 1 + 1) 27-1 MG TABS tablet Take 1 tablet by mouth daily at 12 noon.     No facility-administered medications prior to visit.      ROS:  Review of Systems  Constitutional:  Negative for fever.  Gastrointestinal:  Negative for blood in stool, constipation, diarrhea, nausea and vomiting.  Genitourinary:  Positive for menstrual problem and pelvic pain. Negative for dyspareunia, dysuria, flank pain, frequency, hematuria, urgency, vaginal bleeding, vaginal discharge and vaginal pain.  Musculoskeletal:  Negative for back pain.  Skin:  Negative for rash.   BREAST: No symptoms   OBJECTIVE:   Vitals:  BP 110/74   Ht 5\' 2"  (1.575 m)   Wt 171 lb (77.6 kg)   BMI 31.28 kg/m   Physical Exam Vitals reviewed.  Constitutional:      Appearance: She is well-developed.  Pulmonary:     Effort: Pulmonary effort is normal.  Genitourinary:    General: Normal vulva.     Pubic Area: No rash.      Labia:        Right: No rash, tenderness or lesion.        Left: No rash, tenderness or lesion.      Vagina: Bleeding present. No vaginal discharge, erythema or tenderness.     Cervix: Normal.     Uterus: Normal. Tender. Not enlarged.      Adnexa: Right adnexa normal and left adnexa  normal.       Right: No mass or tenderness.         Left: No mass or tenderness.    Musculoskeletal:        General: Normal range of motion.     Cervical back: Normal range of motion.  Skin:    General: Skin is warm and  dry.  Neurological:     General: No focal deficit present.     Mental Status: She is alert and oriented to person, place, and time.  Psychiatric:        Mood and Affect: Mood normal.        Behavior: Behavior normal.        Thought Content: Thought content normal.        Judgment: Judgment normal.     Results:    04/04/2023   11:40 AM 07/04/2021   12:41 PM 01/02/2021    9:11 AM  GAD 7 : Generalized Anxiety Score  Nervous, Anxious, on Edge 2 0 0  Control/stop worrying 3 0 0  Worry too much - different things 3 0 0  Trouble relaxing 3 0 0  Restless 3 0 0  Easily annoyed or irritable 3 0 0  Afraid - awful might happen 3 0 0  Total GAD 7 Score 20 0 0  Anxiety Difficulty Very difficult         04/04/2023   11:39 AM  Depression screen PHQ 2/9  Decreased Interest 2  Down, Depressed, Hopeless 2  PHQ - 2 Score 4  Altered sleeping 0  Tired, decreased energy 2  Feeling bad or failure about yourself  0  Trouble concentrating 2  Moving slowly or fidgety/restless 2  Suicidal thoughts 0  PHQ-9 Score 10  Difficult doing work/chores Very difficult     Nexplanon removal Procedure note - The Nexplanon was noted in the patient's arm and the end was identified. The skin was cleansed with a Betadine solution. A small injection of subcutaneous lidocaine with epinephrine was given over the end of the implant. An incision was made at the end of the implant. The rod was noted in the incision and grasped with a hemostat. It was noted to be intact.  Steri-Strip was placed approximating the incision. Hemostasis was noted.  Assessment/Plan: Irregular bleeding--most likely due to nexplanon. Removed today, follow sx. If sx persist, will check GYN u/s and can try aygestin to stop  bleeding.   Pelvic pain--cramping, most likely due to nexplanon. Removed today. F/u for GYN u/s if sx persist.   Nexplanon removal--She was told to remove the dressing in 12-24 hours, to keep the incision area dry for 24 hours and to remove the Steristrip in 2-3  days.  Notify us if any signs of tenderness, redness, pain, or fevers develop.  Cervical cancer screening - Plan: Cytology - PAP   Screening for STD (sexually transmitted disease) - Plan: HIV Antibody (routine testing w rflx), RPR Qual, Hepatitis C antibody, Cytology - PAP  Iron deficiency anemia due to chronic blood loss - Plan: CBC with Differential/Platelet, Iron, TIBC and Ferritin Panel; check labs, will f/u with results and mgmt.   Anxiety and depression - Plan: sertraline (ZOLOFT) 50 MG tablet; pos sx, no SI. Pt wants to start meds. Rx sertraline. RTO in 7 wks for f/u/sooner prn. Resume therapist/increase exercise.    Meds ordered this encounter  Medications   sertraline (ZOLOFT) 50 MG tablet    Sig: Take 1/2 tab daily for 6 days, then 1 tab daily    Dispense:  30 tablet    Refill:  1    Order Specific Question:   Supervising Provider    Answer:   Rubie Maid [id number]      Return in about 7 weeks (around 05/23/2023) for anxiety/depression f/u.  Namita Yearwood B. Dwyne Hasegawa, PA-C 04/04/2023 3:32 PM

## 2023-04-05 ENCOUNTER — Encounter: Payer: Self-pay | Admitting: Obstetrics and Gynecology

## 2023-04-05 LAB — CBC WITH DIFFERENTIAL/PLATELET
Basophils Absolute: 0 10*3/uL (ref 0.0–0.2)
Basos: 0 %
EOS (ABSOLUTE): 0.1 10*3/uL (ref 0.0–0.4)
Eos: 1 %
Hematocrit: 41 % (ref 34.0–46.6)
Hemoglobin: 13 g/dL (ref 11.1–15.9)
Immature Grans (Abs): 0 10*3/uL (ref 0.0–0.1)
Immature Granulocytes: 0 %
Lymphocytes Absolute: 2.3 10*3/uL (ref 0.7–3.1)
Lymphs: 32 %
MCH: 22.9 pg — ABNORMAL LOW (ref 26.6–33.0)
MCHC: 31.7 g/dL (ref 31.5–35.7)
MCV: 72 fL — ABNORMAL LOW (ref 79–97)
Monocytes Absolute: 0.4 10*3/uL (ref 0.1–0.9)
Monocytes: 5 %
Neutrophils Absolute: 4.4 10*3/uL (ref 1.4–7.0)
Neutrophils: 62 %
Platelets: 229 10*3/uL (ref 150–450)
RBC: 5.68 x10E6/uL — ABNORMAL HIGH (ref 3.77–5.28)
RDW: 14.2 % (ref 11.7–15.4)
WBC: 7.2 10*3/uL (ref 3.4–10.8)

## 2023-04-05 LAB — IRON,TIBC AND FERRITIN PANEL
Ferritin: 41 ng/mL (ref 15–150)
Iron Saturation: 37 % (ref 15–55)
Iron: 91 ug/dL (ref 27–159)
Total Iron Binding Capacity: 243 ug/dL — ABNORMAL LOW (ref 250–450)
UIBC: 152 ug/dL (ref 131–425)

## 2023-04-05 LAB — HIV ANTIBODY (ROUTINE TESTING W REFLEX): HIV Screen 4th Generation wRfx: NONREACTIVE

## 2023-04-05 LAB — RPR QUALITATIVE: RPR Ser Ql: NONREACTIVE

## 2023-04-05 LAB — HEPATITIS C ANTIBODY: Hep C Virus Ab: NONREACTIVE

## 2023-04-07 MED ORDER — NORETHINDRONE ACETATE 5 MG PO TABS: 5.0000 mg | ORAL_TABLET | Freq: Every day | ORAL | 0 refills | Status: DC

## 2023-04-09 ENCOUNTER — Encounter: Payer: Self-pay | Admitting: Obstetrics and Gynecology

## 2023-04-09 LAB — CYTOLOGY - PAP
Chlamydia: NEGATIVE
Comment: NEGATIVE
Comment: NEGATIVE
Comment: NORMAL
Neisseria Gonorrhea: NEGATIVE
Trichomonas: NEGATIVE

## 2023-04-10 ENCOUNTER — Telehealth: Payer: Self-pay | Admitting: Obstetrics and Gynecology

## 2023-04-10 MED ORDER — FLUCONAZOLE 150 MG PO TABS: 150.0000 mg | ORAL_TABLET | Freq: Once | ORAL | 0 refills | Status: AC

## 2023-04-10 NOTE — Telephone Encounter (Signed)
Rx diflucan for yeast vag sx.  

## 2023-04-11 NOTE — Progress Notes (Signed)
Pls call pt to schedule colpo/ECC with MD. She is aware. Thx.

## 2023-04-28 ENCOUNTER — Other Ambulatory Visit: Payer: Self-pay | Admitting: Obstetrics and Gynecology

## 2023-04-29 ENCOUNTER — Telehealth: Payer: Self-pay

## 2023-04-29 MED ORDER — NORETHINDRONE 0.35 MG PO TABS: 1.0000 | ORAL_TABLET | Freq: Every day | ORAL | 3 refills | Status: DC

## 2023-04-29 NOTE — Telephone Encounter (Signed)
Called pt, no answer, LVMTRC. 

## 2023-04-29 NOTE — Telephone Encounter (Signed)
Pt called the office and would like to be put on a birth control pill that does not have estrogen.

## 2023-04-29 NOTE — Telephone Encounter (Signed)
Pls call pt to check on this Rx. See previous message. I told her we didn't need to keep her on it if she wasn't having heavy bleeding. If she has started bleeding again, it may be her regular period so no need to keep using Rx unless heavy or lasting too long.

## 2023-04-29 NOTE — Telephone Encounter (Signed)
Rx camila eRxd. Pt to start with period. If no period, no sex for 2 wks, then take UPT. If neg, can start POPs. Condoms for 1 wk. HAS to take at same time every day or needs to use condoms for 1 wk if late with pill. F/u prn.

## 2023-04-30 NOTE — Telephone Encounter (Signed)
Pt aware.

## 2023-05-02 ENCOUNTER — Encounter: Payer: Self-pay | Admitting: Obstetrics and Gynecology

## 2023-05-02 ENCOUNTER — Other Ambulatory Visit (HOSPITAL_COMMUNITY)
Admission: RE | Admit: 2023-05-02 | Discharge: 2023-05-02 | Disposition: A | Discharge status: Home / Self Care | Payer: Managed Care, Other (non HMO) | Source: Ambulatory Visit | Attending: Obstetrics and Gynecology | Admitting: Obstetrics and Gynecology

## 2023-05-02 ENCOUNTER — Ambulatory Visit: Payer: Managed Care, Other (non HMO) | Admitting: Obstetrics and Gynecology

## 2023-05-02 VITALS — BP 143/94 | HR 83 | Ht 62.0 in | Wt 173.1 lb

## 2023-05-02 DIAGNOSIS — Z1151 Encounter for screening for human papillomavirus (HPV): Secondary | ICD-10-CM | POA: Insufficient documentation

## 2023-05-02 DIAGNOSIS — R87619 Unspecified abnormal cytological findings in specimens from cervix uteri: Secondary | ICD-10-CM | POA: Diagnosis not present

## 2023-05-02 NOTE — Progress Notes (Signed)
Patient presents today for Colpscopy/ECC.

## 2023-05-02 NOTE — Progress Notes (Signed)
   HPI:  Dynasti Kerman is a 27 y.o.  [phone]  who presents today for evaluation and management of abnormal cervical cytology.    Dysplasia History: Atypical glandular cells   HPV: Not done  ROS:  Pertinent items noted in HPI and remainder of comprehensive ROS otherwise negative.  OB History  Gravida Para Term Preterm AB Living  4 4 3 1   3   SAB IAB Ectopic Multiple Live Births        0 3    # Outcome Date GA Lbr Len/2nd Weight Sex Delivery Anes PTL Lv  4 Term 09/01/22 [redacted]w[redacted]d   M      3 Preterm 2022     Vag-Spont   LIV  2 Term 07/20/20 [redacted]w[redacted]d 11:50 / 00:07 6 lb 6.5 oz (2.906 kg) M Vag-Spont EPI  LIV     Birth Comments:  wnl  1 Term 01/14/16    M Vag-Spont   LIV    Past Medical History:  Diagnosis Date   Complication of anesthesia    fentanyl itching   Hypertension    Gestational Hypertension    Past Surgical History:  Procedure Laterality Date   NO PAST SURGERIES      SOCIAL HISTORY:  Social History   Substance and Sexual Activity  Alcohol Use No    Social History   Substance and Sexual Activity  Drug Use No     Family History  Problem Relation Age of Onset   Lupus Mother    Healthy Father    Asthma Brother    Diabetes Maternal Grandmother    COPD Maternal Grandmother    Cancer Maternal Grandmother        unknown type   Diabetes Maternal Grandfather    Stroke Maternal Grandfather     ALLERGIES:  Fentanyl and Motrin [ibuprofen]  She has a current medication list which includes the following prescription(s): acetaminophen, ibuprofen, norethindrone, sertraline, valacyclovir, ferrous sulfate, naproxen, and [DISCONTINUED] hyoscyamine sulfate sl.  Physical Exam: -Vitals:  BP (!) 143/94   Pulse 83   Ht 5\' 2"  (1.575 m)   Wt 173 lb 1.6 oz (78.5 kg)   LMP 03/30/2023   BMI 31.66 kg/m   PROCEDURE: Colposcopy performed with 4% acetic acid after verbal consent obtained                           -Aceto-white Lesions Location(s): See above               -Biopsy performed at 7 o'clock               -ECC indicated and performed: Yes.       -Biopsy sites made hemostatic with pressure and Monsel's solution   -Satisfactory colposcopy: Yes.      -Evidence of Invasive cervical CA :  NO  ASSESSMENT:  Kandas Oliveto is a 27 y.o. [id number] here for  1. Atypical glandular cells on cervical Pap smear   2. Screening for HPV (human papillomavirus)   .  PLAN: 1.  I discussed the grading system of pap smears and HPV high risk viral types.  We will discuss management after colpo results return.  No orders of the defined types were placed in this encounter.          F/U  Return in about 2 weeks (around 05/16/2023).  Brennan Bailey ,MD 05/02/2023,3:11 PM

## 2023-05-06 ENCOUNTER — Encounter: Payer: Self-pay | Admitting: Obstetrics and Gynecology

## 2023-05-08 LAB — SURGICAL PATHOLOGY

## 2023-05-09 LAB — CYTOLOGY - PAP: Diagnosis: NEGATIVE

## 2023-05-16 ENCOUNTER — Encounter: Payer: Self-pay | Admitting: Obstetrics and Gynecology

## 2023-05-16 ENCOUNTER — Telehealth: Payer: Managed Care, Other (non HMO) | Admitting: Obstetrics and Gynecology

## 2023-05-16 DIAGNOSIS — R87618 Other abnormal cytological findings on specimens from cervix uteri: Secondary | ICD-10-CM | POA: Diagnosis not present

## 2023-05-16 NOTE — Progress Notes (Signed)
Virtual Visit via Video Note  I connected with Michelle Thompson on 05/16/23 at  7:55 AM EDT by video and verified that I was speaking with the correct person using two identifiers.    Michelle Thompson is a 27 y.o. [phone] who LMP was Patient's last menstrual period was 03/30/2023. I discussed the limitations, risks, security and privacy concerns of performing an evaluation and management service by video and the availability of in person appointments. I also discussed with the patient that there may be a patient responsible charge related to this service. The patient expressed understanding and agreed to proceed.  Location of patient:  Work  Patient gave explicit verbal consent for video visit:  YES  Location of provider:  [facility] office  Persons other than physician and patient involved in provider conference:  None   Subjective:   History of Present Illness:    History of atypical glandular cells on Pap.  She has undergone colposcopy.  She presents today to discuss those results.  Hx: The following portions of the patient's history were reviewed and updated as appropriate:             She  has a past medical history of Complication of anesthesia and Hypertension. She does not have any pertinent problems on file. She  has a past surgical history that includes No past surgeries. Her family history includes Asthma in her brother; COPD in her maternal grandmother; Cancer in her maternal grandmother; Diabetes in her maternal grandfather and maternal grandmother; Healthy in her father; Lupus in her mother; Stroke in her maternal grandfather. She  reports that she has never smoked. She has never used smokeless tobacco. She reports that she does not drink alcohol and does not use drugs. She has a current medication list which includes the following prescription(s): acetaminophen, ferrous sulfate, ibuprofen, naproxen, norethindrone, sertraline, valacyclovir, and [DISCONTINUED] hyoscyamine sulfate  sl. She is allergic to fentanyl and motrin [ibuprofen].       Review of Systems:  Review of Systems  Constitutional: Denied constitutional symptoms, night sweats, recent illness, fatigue, fever, insomnia and weight loss.  Eyes: Denied eye symptoms, eye pain, photophobia, vision change and visual disturbance.  Ears/Nose/Throat/Neck: Denied ear, nose, throat or neck symptoms, hearing loss, nasal discharge, sinus congestion and sore throat.  Cardiovascular: Denied cardiovascular symptoms, arrhythmia, chest pain/pressure, edema, exercise intolerance, orthopnea and palpitations.  Respiratory: Denied pulmonary symptoms, asthma, pleuritic pain, productive sputum, cough, dyspnea and wheezing.  Gastrointestinal: Denied, gastro-esophageal reflux, melena, nausea and vomiting.  Genitourinary: Denied genitourinary symptoms including symptomatic vaginal discharge, pelvic relaxation issues, and urinary complaints.  Musculoskeletal: Denied musculoskeletal symptoms, stiffness, swelling, muscle weakness and myalgia.  Dermatologic: Denied dermatology symptoms, rash and scar.  Neurologic: Denied neurology symptoms, dizziness, headache, neck pain and syncope.  Psychiatric: Denied psychiatric symptoms, anxiety and depression.  Endocrine: Denied endocrine symptoms including hot flashes and night sweats.   Meds:   Current Outpatient Medications on File Prior to Visit  Medication Sig Dispense Refill   acetaminophen (TYLENOL) 500 MG tablet Take 1,000 mg by mouth every 6 (six) hours as needed.     ferrous sulfate 325 (65 FE) MG EC tablet Take by mouth. (Patient not taking: Reported on 05/02/2023)     ibuprofen (ADVIL) 600 MG tablet Take 600 mg by mouth every 6 (six) hours as needed.     Naproxen 375 MG TBEC Take 1 tablet by mouth 2 (two) times daily as needed. (Patient not taking: Reported on 05/02/2023)     norethindrone (  MICRONOR) 0.35 MG tablet Take 1 tablet (0.35 mg total) by mouth daily. 84 tablet 3   sertraline  (ZOLOFT) 50 MG tablet Take 1/2 tab daily for 6 days, then 1 tab daily 30 tablet 1   valACYclovir (VALTREX) 1000 MG tablet Take 1 tablet (1,000 mg total) by mouth daily. 30 tablet 5   [DISCONTINUED] Hyoscyamine Sulfate SL (LEVSIN/SL) 0.125 MG SUBL Place 1 tablet under the tongue every 12 (twelve) hours as needed (abdominal cramping). 10 tablet 0   No current facility-administered medications on file prior to visit.    Assessment:    [id number] Patient Active Problem List   Diagnosis Date Noted   Iron deficiency anemia due to chronic blood loss 04/04/2023   Anxiety and depression 04/04/2023   GERD (gastroesophageal reflux disease) 11/06/2022   Upper abdominal pain 11/06/2022   LGSIL on Pap smear of cervix 01/17/2021   GBS (group B streptococcus) UTI complicating pregnancy 01/15/2021   Chronic hypertension affecting pregnancy 06/22/2020     1. Other abnormal cytological finding of specimen from cervix     Colposcopically directed biopsies and ECC showed benign epithelium-no atypia.  Plan:            1.  Discussed findings with the patient.  Recommend follow-up annual exam. Orders No orders of the defined types were placed in this encounter.   No orders of the defined types were placed in this encounter.     F/U  No follow-ups on file. I spent 21 minutes involved in the care of this patient preparing to see the patient by obtaining and reviewing her medical history (including labs, imaging tests and prior procedures), documenting clinical information in the electronic health record (EHR), counseling and coordinating care plans, writing and sending prescriptions, ordering tests or procedures and in direct communicating with the patient and medical staff discussing pertinent items from her history and physical exam.   Elonda Husky, M.D. 05/16/2023 8:28 AM

## 2023-05-22 NOTE — Progress Notes (Deleted)
Pcp, No   No chief complaint on file.   HPI:      [name] is a 27 y.o. [phone] whose LMP was Patient's last menstrual period was 03/30/2023., presents today for ***    Patient Active Problem List   Diagnosis Date Noted   Iron deficiency anemia due to chronic blood loss 04/04/2023   Anxiety and depression 04/04/2023   GERD (gastroesophageal reflux disease) 11/06/2022   Upper abdominal pain 11/06/2022   LGSIL on Pap smear of cervix 01/17/2021   GBS (group B streptococcus) UTI complicating pregnancy 01/15/2021   Chronic hypertension affecting pregnancy 06/22/2020    Past Surgical History:  Procedure Laterality Date   NO PAST SURGERIES      Family History  Problem Relation Age of Onset   Lupus Mother    Healthy Father    Asthma Brother    Diabetes Maternal Grandmother    COPD Maternal Grandmother    Cancer Maternal Grandmother        unknown type   Diabetes Maternal Grandfather    Stroke Maternal Grandfather     Social History   Socioeconomic History   Marital status: Married    Spouse name: Not on file   Number of children: Not on file   Years of education: Not on file   Highest education level: Not on file  Occupational History   Not on file  Tobacco Use   Smoking status: Never   Smokeless tobacco: Never  Vaping Use   Vaping Use: Never used  Substance and Sexual Activity   Alcohol use: No   Drug use: No   Sexual activity: Yes    Partners: Male    Birth control/protection: Pill  Other Topics Concern   Not on file  Social History Narrative   Not on file   Social Determinants of Health   Financial Resource Strain: Not on file  Food Insecurity: No Food Insecurity (07/04/2021)   Hunger Vital Sign    Worried About Running Out of Food in the Last Year: Never true    Ran Out of Food in the Last Year: Never true  Transportation Needs: No Transportation Needs (07/04/2021)   PRAPARE - Administrator, Civil Service (Medical): No     Lack of Transportation (Non-Medical): No  Physical Activity: Not on file  Stress: Not on file  Social Connections: Not on file  Intimate Partner Violence: Not on file    Outpatient Medications Prior to Visit  Medication Sig Dispense Refill   acetaminophen (TYLENOL) 500 MG tablet Take 1,000 mg by mouth every 6 (six) hours as needed.     ferrous sulfate 325 (65 FE) MG EC tablet Take by mouth. (Patient not taking: Reported on 05/02/2023)     ibuprofen (ADVIL) 600 MG tablet Take 600 mg by mouth every 6 (six) hours as needed.     Naproxen 375 MG TBEC Take 1 tablet by mouth 2 (two) times daily as needed. (Patient not taking: Reported on 05/02/2023)     norethindrone (MICRONOR) 0.35 MG tablet Take 1 tablet (0.35 mg total) by mouth daily. 84 tablet 3   sertraline (ZOLOFT) 50 MG tablet Take 1/2 tab daily for 6 days, then 1 tab daily 30 tablet 1   valACYclovir (VALTREX) 1000 MG tablet Take 1 tablet (1,000 mg total) by mouth daily. 30 tablet 5   No facility-administered medications prior to visit.      ROS:  Review of Systems BREAST: No symptoms  OBJECTIVE:   Vitals:  LMP 03/30/2023   Physical Exam  Results: No results found for this or any previous visit (from the past 24 hour(s)).   Assessment/Plan: No diagnosis found.    No orders of the defined types were placed in this encounter.     No follow-ups on file.  Herbert Marken B. Everson Mott, PA-C 05/22/2023 9:05 PM

## 2023-05-23 ENCOUNTER — Ambulatory Visit: Payer: Managed Care, Other (non HMO) | Admitting: Obstetrics and Gynecology

## 2023-07-16 ENCOUNTER — Other Ambulatory Visit: Payer: Self-pay | Admitting: Obstetrics and Gynecology

## 2023-07-16 DIAGNOSIS — F419 Anxiety disorder, unspecified: Secondary | ICD-10-CM

## 2023-07-23 ENCOUNTER — Other Ambulatory Visit: Payer: Self-pay | Admitting: Obstetrics and Gynecology

## 2023-07-23 DIAGNOSIS — F419 Anxiety disorder, unspecified: Secondary | ICD-10-CM

## 2023-11-21 ENCOUNTER — Inpatient Hospital Stay (HOSPITAL_COMMUNITY): Payer: Managed Care, Other (non HMO)

## 2023-11-21 ENCOUNTER — Other Ambulatory Visit: Payer: Self-pay

## 2023-11-21 ENCOUNTER — Encounter (HOSPITAL_COMMUNITY): Payer: Self-pay | Admitting: Obstetrics and Gynecology

## 2023-11-21 ENCOUNTER — Inpatient Hospital Stay (HOSPITAL_COMMUNITY)
Admission: AD | Admit: 2023-11-21 | Discharge: 2023-11-21 | Disposition: A | Discharge status: Home / Self Care | Payer: Managed Care, Other (non HMO) | Attending: Obstetrics and Gynecology | Admitting: Obstetrics and Gynecology

## 2023-11-21 DIAGNOSIS — O99820 Streptococcus B carrier state complicating pregnancy: Secondary | ICD-10-CM | POA: Insufficient documentation

## 2023-11-21 DIAGNOSIS — O368130 Decreased fetal movements, third trimester, not applicable or unspecified: Secondary | ICD-10-CM | POA: Insufficient documentation

## 2023-11-21 DIAGNOSIS — R531 Weakness: Secondary | ICD-10-CM | POA: Diagnosis not present

## 2023-11-21 DIAGNOSIS — O09292 Supervision of pregnancy with other poor reproductive or obstetric history, second trimester: Secondary | ICD-10-CM | POA: Diagnosis not present

## 2023-11-21 DIAGNOSIS — J40 Bronchitis, not specified as acute or chronic: Secondary | ICD-10-CM | POA: Insufficient documentation

## 2023-11-21 DIAGNOSIS — O99891 Other specified diseases and conditions complicating pregnancy: Secondary | ICD-10-CM

## 2023-11-21 DIAGNOSIS — Z3A25 25 weeks gestation of pregnancy: Secondary | ICD-10-CM | POA: Diagnosis not present

## 2023-11-21 DIAGNOSIS — O99512 Diseases of the respiratory system complicating pregnancy, second trimester: Secondary | ICD-10-CM | POA: Diagnosis not present

## 2023-11-21 DIAGNOSIS — O10912 Unspecified pre-existing hypertension complicating pregnancy, second trimester: Secondary | ICD-10-CM | POA: Diagnosis not present

## 2023-11-21 DIAGNOSIS — O99612 Diseases of the digestive system complicating pregnancy, second trimester: Secondary | ICD-10-CM | POA: Insufficient documentation

## 2023-11-21 DIAGNOSIS — O99342 Other mental disorders complicating pregnancy, second trimester: Secondary | ICD-10-CM | POA: Insufficient documentation

## 2023-11-21 DIAGNOSIS — O26892 Other specified pregnancy related conditions, second trimester: Secondary | ICD-10-CM | POA: Diagnosis not present

## 2023-11-21 LAB — URINALYSIS, ROUTINE W REFLEX MICROSCOPIC
Bilirubin Urine: NEGATIVE
Glucose, UA: NEGATIVE mg/dL
Hgb urine dipstick: NEGATIVE
Ketones, ur: NEGATIVE mg/dL
Leukocytes,Ua: NEGATIVE
Nitrite: NEGATIVE
Protein, ur: 100 mg/dL — AB
Specific Gravity, Urine: 1.026 (ref 1.005–1.030)
pH: 6 (ref 5.0–8.0)

## 2023-11-21 MED ORDER — HYDROCOD POLI-CHLORPHE POLI ER 10-8 MG/5ML PO SUER
5.0000 mL | Freq: Once | ORAL | Status: AC
  Administered 2023-11-21: 5 mL via ORAL
  Filled 2023-11-21: qty 5

## 2023-11-21 MED ORDER — ALBUTEROL SULFATE (2.5 MG/3ML) 0.083% IN NEBU
2.5000 mg | INHALATION_SOLUTION | Freq: Once | RESPIRATORY_TRACT | Status: AC
  Administered 2023-11-21: 2.5 mg via RESPIRATORY_TRACT
  Filled 2023-11-21: qty 3

## 2023-11-21 MED ORDER — ALBUTEROL SULFATE HFA 108 (90 BASE) MCG/ACT IN AERS: 2.0000 | INHALATION_SPRAY | Freq: Four times a day (QID) | RESPIRATORY_TRACT | 2 refills | Status: AC | PRN

## 2023-11-21 MED ORDER — GUAIFENESIN ER 600 MG PO TB12: 1200.0000 mg | ORAL_TABLET | Freq: Two times a day (BID) | ORAL | 0 refills | Status: AC

## 2023-11-21 NOTE — MAU Note (Addendum)
[name] is a 27 y.o. at Unknown here in MAU reporting: she's been sick for about 1 week ago.  Reports diagnosed with pneumonia 4 days ago and given antibiotics.  States she feels like she's getting worse, states she feels weak and could barely walk without assistance.  States she feels heaviness in chest, intermittently productive cough.  Reports was negative for Covid & flu 2 days ago. Denies VB or LOF.  Reports decreased FM. LMP: NA Onset of complaint: 1 weeks Pain score: 7 Vitals:   11/21/23 1554  BP: (!) 152/98  Pulse: (!) 106  Resp: 19  Temp: 97.9 F (36.6 C)  SpO2: 98%     FHT: 150 bpm Lab orders placed from triage:   UA

## 2023-11-21 NOTE — Discharge Instructions (Signed)
For cold and allergy symptoms  You may take Mucinex, (Allegra, Claritin, Zyrtec, Xyzal, Clarinex-any one of these--they are the same class--same as Benadryl), Sudafed (must be behind the counter, should not be phenylephrine), saline or steroid (Nasacort, Nasonex and Flonase) nasal sprays.

## 2023-11-21 NOTE — MAU Provider Note (Addendum)
Obstetric Attending MAU Note  Chief Complaint:  Decreased Fetal Movement and Chest Heaviness   Event Date/Time   First Provider Initiated Contact with Patient 11/21/23 1640     HPI: Michelle Thompson is a 27 y.o. [id number] at [redacted]w[redacted]d who presents to maternity admissions reporting cough and weakness. Seen at [facility] yesterday and noted to have pneumonia. No CXR done. Started on Azithromycin and took 2 yesterday and 1 today. Notes chest heaviness on right side. Taking Robitussin for cough. Denies fever. Bringing up green sputum. Denies contractions, leakage of fluid or vaginal bleeding. Good fetal movement.   Pregnancy Course: Receives care at [facility] R with Dr. Ralph Dowdy.  Patient Active Problem List   Diagnosis Date Noted   Iron deficiency anemia due to chronic blood loss 04/04/2023   Anxiety and depression 04/04/2023   GERD (gastroesophageal reflux disease) 11/06/2022   Upper abdominal pain 11/06/2022   LGSIL on Pap smear of cervix 01/17/2021   GBS (group B streptococcus) UTI complicating pregnancy 01/15/2021   Chronic hypertension affecting pregnancy 06/22/2020    Past Medical History:  Diagnosis Date   Complication of anesthesia    fentanyl itching   Hypertension    Gestational Hypertension    OB History  Gravida Para Term Preterm AB Living  5 4 3 1   3   SAB IAB Ectopic Multiple Live Births        0 3    # Outcome Date GA Lbr Len/2nd Weight Sex Type Anes PTL Lv  5 Current           4 Term 09/01/22 [redacted]w[redacted]d   M      3 Preterm 2022     Vag-Spont   LIV  2 Term 07/20/20 [redacted]w[redacted]d 11:50 / 00:07 2906 g M Vag-Spont EPI  LIV     Birth Comments:  wnl  1 Term 01/14/16    M Vag-Spont   LIV    Past Surgical History:  Procedure Laterality Date   NO PAST SURGERIES      Family History: Family History  Problem Relation Age of Onset   Lupus Mother    Healthy Father    Asthma Brother    Diabetes Maternal Grandmother    COPD Maternal Grandmother    Cancer Maternal Grandmother        unknown type    Diabetes Maternal Grandfather    Stroke Maternal Grandfather     Social History: Social History   Tobacco Use   Smoking status: Never   Smokeless tobacco: Never  Vaping Use   Vaping status: Never Used  Substance Use Topics   Alcohol use: No   Drug use: No    Allergies:  Allergies  Allergen Reactions   Fentanyl Shortness Of Breath and Itching    7.11.2022 Pt reports that she was having anxiety and this was the reason for shortness of breath.   Motrin [Ibuprofen] Other (See Comments)    Unknown reaction-childhood    Medications Prior to Admission  Medication Sig Dispense Refill Last Dose   acetaminophen (TYLENOL) 500 MG tablet Take 1,000 mg by mouth every 6 (six) hours as needed.   11/21/2023   ferrous sulfate 325 (65 FE) MG EC tablet Take by mouth.   11/20/2023   sertraline (ZOLOFT) 50 MG tablet Take 1/2 tab daily for 6 days, then 1 tab daily 30 tablet 1 11/20/2023   ibuprofen (ADVIL) 600 MG tablet Take 600 mg by mouth every 6 (six) hours as needed.   Unknown  Naproxen 375 MG TBEC Take 1 tablet by mouth 2 (two) times daily as needed. (Patient not taking: Reported on 05/02/2023)   Unknown   norethindrone (MICRONOR) 0.35 MG tablet Take 1 tablet (0.35 mg total) by mouth daily. 84 tablet 3 Unknown   valACYclovir (VALTREX) 1000 MG tablet Take 1 tablet (1,000 mg total) by mouth daily. 30 tablet 5 More than a month    ROS: Pertinent findings in history of present illness.  Physical Exam  Blood pressure 130/65, pulse (!) 107, temperature 99 F (37.2 C), temperature source Oral, resp. rate 18, height 5\' 2"  (1.575 m), weight 79.2 kg, last menstrual period 03/30/2023, SpO2 98%, not currently breastfeeding. CONSTITUTIONAL: Well-developed, well-nourished female in no acute distress.  HENT:  Normocephalic, atraumatic, External right and left ear normal. Oropharynx is clear and moist EYES: Conjunctivae and EOM are normal. No scleral icterus.  NECK: Normal range of motion, supple, no  masses SKIN: Skin is warm and dry. No rash noted. Not diaphoretic. No erythema. No pallor. NEUROLGIC: Alert and oriented to person, place, and time.  CARDIOVASCULAR: Normal heart rate noted, regular rhythm RESPIRATORY: Effort and breath sounds normal, no problems with respiration noted. ? Consolidation RML, clear to auscultation ABDOMEN: Soft, nontender, nondistended, gravid appropriate for gestational age MUSCULOSKELETAL: Normal range of motion. No edema and no tenderness. 2+ distal pulses.   FHT:  Baseline 140 , moderate variability, 10 x 10 accelerations present, no decelerations Contractions: quiet   Labs: Results for orders placed or performed during the hospital encounter of 11/21/23 (from the past 24 hour(s))  Urinalysis, Routine w reflex microscopic -Urine, Clean Catch     Status: Abnormal   Collection Time: 11/21/23  4:24 PM  Result Value Ref Range   Color, Urine YELLOW YELLOW   APPearance CLEAR CLEAR   Specific Gravity, Urine 1.026 1.005 - 1.030   pH 6.0 5.0 - 8.0   Glucose, UA NEGATIVE NEGATIVE mg/dL   Hgb urine dipstick NEGATIVE NEGATIVE   Bilirubin Urine NEGATIVE NEGATIVE   Ketones, ur NEGATIVE NEGATIVE mg/dL   Protein, ur 161 (A) NEGATIVE mg/dL   Nitrite NEGATIVE NEGATIVE   Leukocytes,Ua NEGATIVE NEGATIVE   RBC / HPF 0-5 0 - 5 RBC/hpf   WBC, UA 0-5 0 - 5 WBC/hpf   Bacteria, UA RARE (A) NONE SEEN   Squamous Epithelial / HPF 0-5 0 - 5 /HPF   Mucus PRESENT     Imaging:  DG Chest 2 View  Result Date: 11/21/2023 CLINICAL DATA:  Recent diagnosis of pneumonia on treatment. Worsening chest heaviness and productive cough EXAM: CHEST - 2 VIEW COMPARISON:  Chest radiograph dated 01/15/2018 FINDINGS: Low lung volumes with bronchovascular crowding. No focal consolidations. No pleural effusion or pneumothorax. The heart size and mediastinal contours are within normal limits. No acute osseous abnormality. IMPRESSION: Low lung volumes with bronchovascular crowding. No focal  consolidations. Electronically Signed   By: Agustin Cree M.D.   On: 11/21/2023 19:09    MAU Course: Given Albuterol and Tussionex. Reports continued mild chest discomfort Ox sats are WNL CXR is negative.  Assessment: 1. Bronchitis   2. [redacted] weeks gestation of pregnancy   3. Decreased fetal movements in third trimester, single or unspecified fetus     Plan: Discharge home Labor precautions and fetal kick counts reviewed Rx for Mucinex and Albuterol Continue Azithromycin Symptomatic treatments given via written instructions. Follow up with OB provider   Follow-up Information     Ernestina Penna, MD Follow up.   Specialty: Obstetrics and  Gynecology Why: keep next scheduled appointment Contact information: [address] [place] [address] [phone]                 Allergies as of 11/21/2023       Reactions   Fentanyl Shortness Of Breath, Itching   7.11.2022 Pt reports that she was having anxiety and this was the reason for shortness of breath.   Motrin [ibuprofen] Other (See Comments)   Unknown reaction-childhood        Medication List     STOP taking these medications    ibuprofen 600 MG tablet Commonly known as: ADVIL   Naproxen 375 MG Tbec   norethindrone 0.35 MG tablet Commonly known as: MICRONOR       TAKE these medications    acetaminophen 500 MG tablet Commonly known as: TYLENOL Take 1,000 mg by mouth every 6 (six) hours as needed.   albuterol 108 (90 Base) MCG/ACT inhaler Commonly known as: VENTOLIN HFA Inhale 2 puffs into the lungs every 6 (six) hours as needed for wheezing or shortness of breath.   ferrous sulfate 325 (65 FE) MG EC tablet Take by mouth.   guaiFENesin 600 MG 12 hr tablet Commonly known as: Mucinex Take 2 tablets (1,200 mg total) by mouth 2 (two) times daily.   sertraline 50 MG tablet Commonly known as: Zoloft Take 1/2 tab daily for 6 days, then 1 tab daily   valACYclovir 1000 MG tablet Commonly known as:  Valtrex Take 1 tablet (1,000 mg total) by mouth daily.        Reva Bores, MD 11/21/2023 7:37 PM

## 2024-04-30 ENCOUNTER — Other Ambulatory Visit: Payer: Self-pay

## 2024-04-30 DIAGNOSIS — B009 Herpesviral infection, unspecified: Secondary | ICD-10-CM

## 2024-04-30 DIAGNOSIS — F419 Anxiety disorder, unspecified: Secondary | ICD-10-CM

## 2024-04-30 MED ORDER — VALACYCLOVIR HCL 1 G PO TABS: 1000.0000 mg | ORAL_TABLET | Freq: Every day | ORAL | 5 refills | Status: AC

## 2024-04-30 MED ORDER — SERTRALINE HCL 50 MG PO TABS: ORAL_TABLET | ORAL | 1 refills | Status: AC

## 2024-05-13 NOTE — Progress Notes (Deleted)
 PCP:  Pcp, No   No chief complaint on file.    HPI:      [name] is a 28 y.o. [phone] whose LMP was Patient's last menstrual period was 03/30/2023., presents today for her annual examination.  Her menses are {norm/abn:715}, lasting {number: 22536} days.  Dysmenorrhea {dysmen:716}. She {does:18564} have intermenstrual bleeding.  Sex activity: {sex active: 315163}.  Last Pap: 04/04/23 Results were AGC; neg repeat pap and colpo/bx 05/02/23  Hx of STDs: {STD hx:14358}  There is no FH of breast cancer. There is no FH of ovarian cancer. The patient {does:18564} do self-breast exams.  Tobacco use: {tob:20664} Alcohol use: {Alcohol:11675} No drug use.  Exercise: {exercise:31265}  She {does:18564} get adequate calcium and Vitamin D in her diet.  Patient Active Problem List   Diagnosis Date Noted   Iron deficiency anemia due to chronic blood loss 04/04/2023   Anxiety and depression 04/04/2023   GERD (gastroesophageal reflux disease) 11/06/2022   Upper abdominal pain 11/06/2022   LGSIL on Pap smear of cervix 01/17/2021   GBS (group B streptococcus) UTI complicating pregnancy 01/15/2021   Chronic hypertension affecting pregnancy 06/22/2020    Past Surgical History:  Procedure Laterality Date   NO PAST SURGERIES      Family History  Problem Relation Age of Onset   Lupus Mother    Healthy Father    Asthma Brother    Diabetes Maternal Grandmother    COPD Maternal Grandmother    Cancer Maternal Grandmother        unknown type   Diabetes Maternal Grandfather    Stroke Maternal Grandfather     Social History   Socioeconomic History   Marital status: Married    Spouse name: Not on file   Number of children: Not on file   Years of education: Not on file   Highest education level: Not on file  Occupational History   Not on file  Tobacco Use   Smoking status: Never   Smokeless tobacco: Never  Vaping Use   Vaping status: Never Used  Substance and Sexual  Activity   Alcohol use: No   Drug use: No   Sexual activity: Yes    Partners: Male    Birth control/protection: Pill  Other Topics Concern   Not on file  Social History Narrative   Not on file   Social Drivers of Health   Financial Resource Strain: Not on file  Food Insecurity: Medium Risk (12/31/2023)   Received from [facility]   Hunger Vital Sign    Worried About Running Out of Food in the Last Year: Sometimes true    Ran Out of Food in the Last Year: Never true  Transportation Needs: No Transportation Needs (12/29/2023)   Received from [facility]    In the past 12 months, has lack of reliable transportation kept you from medical appointments, meetings, work or from getting things needed for daily living? : No  Physical Activity: Not on file  Stress: Not on file  Social Connections: Not on file  Intimate Partner Violence: Not At Risk (09/16/2023)   Received from [facility]   Humiliation, Afraid, Rape, and Kick questionnaire    Fear of Current or Ex-Partner: No    Emotionally Abused: No    Physically Abused: No    Sexually Abused: No     Current Outpatient Medications:    acetaminophen  (TYLENOL ) 500 MG tablet, Take 1,000 mg by mouth every 6 (six)  hours as needed., Disp: , Rfl:    albuterol  (VENTOLIN  HFA) 108 (90 Base) MCG/ACT inhaler, Inhale 2 puffs into the lungs every 6 (six) hours as needed for wheezing or shortness of breath., Disp: 8 g, Rfl: 2   ferrous sulfate  325 (65 FE) MG EC tablet, Take by mouth., Disp: , Rfl:    guaiFENesin  (MUCINEX ) 600 MG 12 hr tablet, Take 2 tablets (1,200 mg total) by mouth 2 (two) times daily., Disp: 20 tablet, Rfl: 0   sertraline  (ZOLOFT ) 50 MG tablet, Take 1/2 tab daily for 6 days, then 1 tab daily, Disp: 30 tablet, Rfl: 1   valACYclovir  (VALTREX ) 1000 MG tablet, Take 1 tablet (1,000 mg total) by mouth daily., Disp: 30 tablet, Rfl: 5     ROS:  Review of Systems BREAST: No symptoms   Objective: LMP  03/30/2023    OBGyn Exam  Results: No results found for this or any previous visit (from the past 24 hours).  Assessment/Plan: No diagnosis found.  No orders of the defined types were placed in this encounter.            GYN counsel {counseling: 16159}     F/U  No follow-ups on file.  Wilkin Lippy B. Shanayah Kaffenberger, PA-C 05/13/2024 4:23 PM

## 2024-05-14 ENCOUNTER — Ambulatory Visit: Admitting: Obstetrics and Gynecology

## 2024-05-14 DIAGNOSIS — Z124 Encounter for screening for malignant neoplasm of cervix: Secondary | ICD-10-CM

## 2024-05-14 DIAGNOSIS — Z01419 Encounter for gynecological examination (general) (routine) without abnormal findings: Secondary | ICD-10-CM

## 2024-05-18 ENCOUNTER — Encounter: Payer: Self-pay | Admitting: Obstetrics and Gynecology
